# Patient Record
Sex: Male | Born: 1950 | Race: Black or African American | Hispanic: No | Marital: Single | Smoking: Never smoker
Health system: Southern US, Community
[De-identification: ages and names within clinical notes are randomized; demographics above are authoritative.]

## PROBLEM LIST (undated history)

## (undated) DIAGNOSIS — I4892 Unspecified atrial flutter: Secondary | ICD-10-CM

## (undated) DIAGNOSIS — F1010 Alcohol abuse, uncomplicated: Secondary | ICD-10-CM

## (undated) DIAGNOSIS — R569 Unspecified convulsions: Secondary | ICD-10-CM

## (undated) DIAGNOSIS — I340 Nonrheumatic mitral (valve) insufficiency: Secondary | ICD-10-CM

---

## 2020-01-31 ENCOUNTER — Other Ambulatory Visit: Payer: Self-pay

## 2020-01-31 ENCOUNTER — Emergency Department (HOSPITAL_COMMUNITY): Payer: Medicare Other

## 2020-01-31 ENCOUNTER — Observation Stay (HOSPITAL_COMMUNITY)
Admission: EM | Admit: 2020-01-31 | Discharge: 2020-02-01 | Disposition: A | Discharge status: Home / Self Care | Payer: Medicare Other | Attending: Internal Medicine | Admitting: Internal Medicine

## 2020-01-31 ENCOUNTER — Encounter (HOSPITAL_COMMUNITY): Payer: Self-pay | Admitting: Emergency Medicine

## 2020-01-31 ENCOUNTER — Observation Stay (HOSPITAL_BASED_OUTPATIENT_CLINIC_OR_DEPARTMENT_OTHER): Payer: Medicare Other

## 2020-01-31 DIAGNOSIS — Z20822 Contact with and (suspected) exposure to covid-19: Secondary | ICD-10-CM | POA: Insufficient documentation

## 2020-01-31 DIAGNOSIS — R918 Other nonspecific abnormal finding of lung field: Secondary | ICD-10-CM | POA: Diagnosis not present

## 2020-01-31 DIAGNOSIS — S22000A Wedge compression fracture of unspecified thoracic vertebra, initial encounter for closed fracture: Secondary | ICD-10-CM

## 2020-01-31 DIAGNOSIS — F10239 Alcohol dependence with withdrawal, unspecified: Secondary | ICD-10-CM

## 2020-01-31 DIAGNOSIS — I6789 Other cerebrovascular disease: Secondary | ICD-10-CM | POA: Insufficient documentation

## 2020-01-31 DIAGNOSIS — R0600 Dyspnea, unspecified: Secondary | ICD-10-CM | POA: Diagnosis not present

## 2020-01-31 DIAGNOSIS — R079 Chest pain, unspecified: Principal | ICD-10-CM

## 2020-01-31 DIAGNOSIS — Z590 Homelessness: Secondary | ICD-10-CM | POA: Insufficient documentation

## 2020-01-31 DIAGNOSIS — J439 Emphysema, unspecified: Secondary | ICD-10-CM | POA: Insufficient documentation

## 2020-01-31 DIAGNOSIS — R000 Tachycardia, unspecified: Secondary | ICD-10-CM | POA: Insufficient documentation

## 2020-01-31 DIAGNOSIS — I272 Pulmonary hypertension, unspecified: Secondary | ICD-10-CM | POA: Insufficient documentation

## 2020-01-31 DIAGNOSIS — I2510 Atherosclerotic heart disease of native coronary artery without angina pectoris: Secondary | ICD-10-CM | POA: Insufficient documentation

## 2020-01-31 DIAGNOSIS — I700 Atherosclerosis of aorta: Secondary | ICD-10-CM | POA: Insufficient documentation

## 2020-01-31 DIAGNOSIS — F10930 Alcohol use, unspecified with withdrawal, uncomplicated: Secondary | ICD-10-CM

## 2020-01-31 DIAGNOSIS — R569 Unspecified convulsions: Secondary | ICD-10-CM | POA: Diagnosis not present

## 2020-01-31 DIAGNOSIS — M4854XA Collapsed vertebra, not elsewhere classified, thoracic region, initial encounter for fracture: Secondary | ICD-10-CM | POA: Diagnosis not present

## 2020-01-31 DIAGNOSIS — R0602 Shortness of breath: Secondary | ICD-10-CM | POA: Insufficient documentation

## 2020-01-31 DIAGNOSIS — J3489 Other specified disorders of nose and nasal sinuses: Secondary | ICD-10-CM | POA: Insufficient documentation

## 2020-01-31 DIAGNOSIS — I484 Atypical atrial flutter: Secondary | ICD-10-CM | POA: Insufficient documentation

## 2020-01-31 HISTORY — DX: Unspecified convulsions: R56.9

## 2020-01-31 LAB — LIPID PANEL
Cholesterol: 107 mg/dL (ref 0–200)
HDL: 43 mg/dL (ref >40)
LDL Cholesterol: 58 mg/dL (ref 0–99)
Total CHOL/HDL Ratio: 2.5 RATIO
Triglycerides: 29 mg/dL (ref <150)
VLDL: 6 mg/dL (ref 0–40)

## 2020-01-31 LAB — CREATININE, SERUM
Creatinine, Ser: 0.97 mg/dL (ref 0.61–1.24)
GFR calc Af Amer: >60 mL/min (ref >60)
GFR calc non Af Amer: >60 mL/min (ref >60)

## 2020-01-31 LAB — CBC
HCT: 43.1 % (ref 39.0–52.0)
HCT: 42.9 % (ref 39.0–52.0)
Hemoglobin: 13.7 g/dL (ref 13.0–17.0)
Hemoglobin: 13.7 g/dL (ref 13.0–17.0)
MCH: 30.2 pg (ref 26.0–34.0)
MCH: 29.3 pg (ref 26.0–34.0)
MCHC: 31.8 g/dL (ref 30.0–36.0)
MCHC: 31.9 g/dL (ref 30.0–36.0)
MCV: 95.1 fL (ref 80.0–100.0)
MCV: 91.9 fL (ref 80.0–100.0)
Platelets: 125 10*3/uL — ABNORMAL LOW (ref 150–400)
Platelets: 116 10*3/uL — ABNORMAL LOW (ref 150–400)
RBC: 4.53 MIL/uL (ref 4.22–5.81)
RBC: 4.67 MIL/uL (ref 4.22–5.81)
RDW: 13.8 % (ref 11.5–15.5)
RDW: 13.9 % (ref 11.5–15.5)
WBC: 4.4 10*3/uL (ref 4.0–10.5)
WBC: 9.6 10*3/uL (ref 4.0–10.5)
nRBC: 0 % (ref 0.0–0.2)
nRBC: 0 % (ref 0.0–0.2)

## 2020-01-31 LAB — ECHOCARDIOGRAM COMPLETE
Height: 74 in
MV M vel: 4.65 m/s
MV Peak grad: 86.5 mmHg
Radius: 0.8 cm
S' Lateral: 3.9 cm
Weight: 2640 oz

## 2020-01-31 LAB — BASIC METABOLIC PANEL
Anion gap: 16 — ABNORMAL HIGH (ref 5–15)
BUN: 8 mg/dL (ref 8–23)
CO2: 19 mmol/L — ABNORMAL LOW (ref 22–32)
Calcium: 9.1 mg/dL (ref 8.9–10.3)
Chloride: 103 mmol/L (ref 98–111)
Creatinine, Ser: 1.07 mg/dL (ref 0.61–1.24)
GFR calc Af Amer: >60 mL/min (ref >60)
GFR calc non Af Amer: >60 mL/min (ref >60)
Glucose, Bld: 118 mg/dL — ABNORMAL HIGH (ref 70–99)
Potassium: 3.8 mmol/L (ref 3.5–5.1)
Sodium: 138 mmol/L (ref 135–145)

## 2020-01-31 LAB — PHOSPHORUS: Phosphorus: 3.8 mg/dL (ref 2.5–4.6)

## 2020-01-31 LAB — MAGNESIUM: Magnesium: 2.3 mg/dL (ref 1.7–2.4)

## 2020-01-31 LAB — TROPONIN I (HIGH SENSITIVITY)
Troponin I (High Sensitivity): 12 ng/L (ref <18)
Troponin I (High Sensitivity): 35 ng/L — ABNORMAL HIGH (ref <18)
Troponin I (High Sensitivity): 21 ng/L — ABNORMAL HIGH (ref <18)
Troponin I (High Sensitivity): 21 ng/L — ABNORMAL HIGH (ref <18)

## 2020-01-31 LAB — RAPID URINE DRUG SCREEN, HOSP PERFORMED
Amphetamines: NOT DETECTED
Barbiturates: NOT DETECTED
Benzodiazepines: NOT DETECTED
Cocaine: NOT DETECTED
Opiates: NOT DETECTED
Tetrahydrocannabinol: NOT DETECTED

## 2020-01-31 LAB — BRAIN NATRIURETIC PEPTIDE: B Natriuretic Peptide: 195.5 pg/mL — ABNORMAL HIGH (ref 0.0–100.0)

## 2020-01-31 LAB — HEMOGLOBIN A1C
Hgb A1c MFr Bld: 5.4 % (ref 4.8–5.6)
Mean Plasma Glucose: 108.28 mg/dL

## 2020-01-31 LAB — HIV ANTIBODY (ROUTINE TESTING W REFLEX): HIV Screen 4th Generation wRfx: NONREACTIVE

## 2020-01-31 LAB — ETHANOL: Alcohol, Ethyl (B): <10 mg/dL (ref <10)

## 2020-01-31 LAB — SARS CORONAVIRUS 2 BY RT PCR (HOSPITAL ORDER, PERFORMED IN ~~LOC~~ HOSPITAL LAB): SARS Coronavirus 2: NEGATIVE

## 2020-01-31 MED ORDER — SODIUM CHLORIDE 0.9% FLUSH
3.0000 mL | Freq: Two times a day (BID) | INTRAVENOUS | Status: DC
  Administered 2020-01-31 – 2020-02-01 (×2): 3 mL via INTRAVENOUS

## 2020-01-31 MED ORDER — LORAZEPAM 1 MG PO TABS: 1.0000 mg | ORAL_TABLET | ORAL | Status: DC | PRN

## 2020-01-31 MED ORDER — METOPROLOL TARTRATE 5 MG/5ML IV SOLN: 5.0000 mg | Freq: Four times a day (QID) | INTRAVENOUS | Status: DC

## 2020-01-31 MED ORDER — LEVETIRACETAM IN NACL 1000 MG/100ML IV SOLN
1000.0000 mg | Freq: Once | INTRAVENOUS | Status: AC
  Administered 2020-01-31: 1000 mg via INTRAVENOUS
  Filled 2020-01-31: qty 100

## 2020-01-31 MED ORDER — THIAMINE HCL 100 MG/ML IJ SOLN
Freq: Once | INTRAVENOUS | Status: AC
  Filled 2020-01-31: qty 1000

## 2020-01-31 MED ORDER — SODIUM CHLORIDE 0.9% FLUSH: 3.0000 mL | Freq: Once | INTRAVENOUS | Status: DC

## 2020-01-31 MED ORDER — METOPROLOL TARTRATE 25 MG PO TABS
25.0000 mg | ORAL_TABLET | Freq: Two times a day (BID) | ORAL | Status: DC
  Administered 2020-01-31: 25 mg via ORAL
  Filled 2020-01-31: qty 1

## 2020-01-31 MED ORDER — LORAZEPAM 2 MG/ML IJ SOLN: 1.0000 mg | Freq: Four times a day (QID) | INTRAMUSCULAR | Status: DC

## 2020-01-31 MED ORDER — THIAMINE HCL 100 MG PO TABS
100.0000 mg | ORAL_TABLET | Freq: Every day | ORAL | Status: DC
  Administered 2020-01-31 – 2020-02-01 (×2): 100 mg via ORAL
  Filled 2020-01-31 (×2): qty 1

## 2020-01-31 MED ORDER — ADULT MULTIVITAMIN W/MINERALS CH
1.0000 | ORAL_TABLET | Freq: Every day | ORAL | Status: DC
  Administered 2020-01-31 – 2020-02-01 (×2): 1 via ORAL
  Filled 2020-01-31 (×2): qty 1

## 2020-01-31 MED ORDER — ENOXAPARIN SODIUM 40 MG/0.4ML ~~LOC~~ SOLN
40.0000 mg | SUBCUTANEOUS | Status: DC
  Administered 2020-01-31: 40 mg via SUBCUTANEOUS
  Filled 2020-01-31 (×2): qty 0.4

## 2020-01-31 MED ORDER — ACETAMINOPHEN 650 MG RE SUPP: 650.0000 mg | Freq: Four times a day (QID) | RECTAL | Status: DC | PRN

## 2020-01-31 MED ORDER — FOLIC ACID 1 MG PO TABS
1.0000 mg | ORAL_TABLET | Freq: Every day | ORAL | Status: DC
  Administered 2020-01-31 – 2020-02-01 (×2): 1 mg via ORAL
  Filled 2020-01-31 (×2): qty 1

## 2020-01-31 MED ORDER — ASPIRIN 81 MG PO CHEW: 324.0000 mg | CHEWABLE_TABLET | Freq: Once | ORAL | Status: DC

## 2020-01-31 MED ORDER — METOPROLOL TARTRATE 5 MG/5ML IV SOLN: 5.0000 mg | Freq: Four times a day (QID) | INTRAVENOUS | Status: DC | PRN

## 2020-01-31 MED ORDER — LORAZEPAM 2 MG/ML IJ SOLN
2.0000 mg | Freq: Four times a day (QID) | INTRAMUSCULAR | Status: DC
  Administered 2020-01-31: 2 mg via INTRAVENOUS
  Filled 2020-01-31 (×2): qty 1

## 2020-01-31 MED ORDER — THIAMINE HCL 100 MG/ML IJ SOLN: 100.0000 mg | Freq: Every day | INTRAMUSCULAR | Status: DC

## 2020-01-31 MED ORDER — ACETAMINOPHEN 325 MG PO TABS: 650.0000 mg | ORAL_TABLET | Freq: Four times a day (QID) | ORAL | Status: DC | PRN

## 2020-01-31 MED ORDER — IOHEXOL 350 MG/ML SOLN
90.0000 mL | Freq: Once | INTRAVENOUS | Status: AC | PRN
  Administered 2020-01-31: 90 mL via INTRAVENOUS

## 2020-01-31 MED ORDER — LORAZEPAM 2 MG/ML IJ SOLN: 1.0000 mg | INTRAMUSCULAR | Status: DC | PRN

## 2020-01-31 NOTE — H&P (Addendum)
History and Physical        Hospital Admission Note Date: 01/31/2020  Patient name: Roy Ryan Medical record number: [id number] Date of birth: 12-Dec-1950 Age: 69 y.o. Gender: male  PCP: Patient, No Pcp Per  Patient coming from: Homeless, lives at a motel At baseline, ambulates: With a walker  Chief Complaint    Chief Complaint  Patient presents with  . Seizures  . Chest Pain      HPI:   This is a 69 year old male with past medical history of alcohol abuse who presented to the ED via EMS on 7/30 from a local motel with reports of a seizure-like episode x1 this evening as well dyspnea and chest pain on exertion x2 days.  He received a dose of sublingual nitro and full dose aspirin with improved chest pain.  Not had an episode of vomiting or diaphoresis.  States that his last beer was last evening around 8 or 9 PM.  Currently is chest pain-free and denies any complaints at this time.  Denies any known or similar family history of cardiac issues.  ED Course: Hemodynamically stable on room air with tachycardia, CXR with midthoracic compression deformity of uncertain chronicity, CT head with paranasal sinus disease but otherwise unremarkable.  CTA chest negative for PE but with findings suspicious of RUL, LLL pneumonia, atelectasis and age uncertain which compression fractures of T-spine.  Notable labs: Glucose 118, AG 16, troponin XII -> 35, platelets 125 he was given Keppra 1000 mg x 1.  Vitals:   01/31/20 0755 01/31/20 0800  BP: (!) 163/46 (!) 128/92  Pulse:  (!) 117  Resp:  18  Temp:    SpO2:  100%     Review of Systems:  Review of Systems  Constitutional: Negative for chills and fever.  Respiratory: Negative for shortness of breath.   Cardiovascular: Negative for chest pain and palpitations.  Gastrointestinal: Negative for nausea and vomiting.  Musculoskeletal:  Negative for myalgias.       Fall last week  Neurological: Negative.  Negative for tingling.  All other systems reviewed and are negative.   Medical/Social/Family History   Past Medical History: Past Medical History:  Diagnosis Date  . Seizures (HCC)     History reviewed. No pertinent surgical history.  Medications: Prior to Admission medications   Not on File    Allergies:  No Known Allergies  Social History:  reports that he has never smoked. He has never used smokeless tobacco. He reports that he does not drink alcohol and does not use drugs.  Family History: History reviewed. No pertinent family history.   Objective   Physical Exam: Blood pressure (!) 128/92, pulse (!) 117, temperature 98 F (36.7 C), temperature source Oral, resp. rate 18, height 6\' 2"  (1.88 m), weight 74.8 kg, SpO2 100 %.  Physical Exam Vitals and nursing note reviewed.  Constitutional:      Appearance: Normal appearance.  HENT:     Head: Normocephalic and atraumatic.     Comments: Poor dentition Eyes:     Conjunctiva/sclera: Conjunctivae normal.  Cardiovascular:     Rate and Rhythm: Regular rhythm. Tachycardia present.     Heart sounds: Murmur heard.  Systolic murmur is present.  Pulmonary:     Effort: Pulmonary effort is normal.     Breath sounds: Normal breath sounds.  Abdominal:     General: Abdomen is flat.     Palpations: Abdomen is soft.  Musculoskeletal:        General: No swelling or tenderness.  Skin:    Coloration: Skin is not jaundiced or pale.  Neurological:     General: No focal deficit present.     Mental Status: He is alert and oriented to person, place, and time. Mental status is at baseline.  Psychiatric:        Mood and Affect: Mood normal.        Behavior: Behavior normal.     LABS on Admission: I have personally reviewed all the labs and imaging below    Basic Metabolic Panel: Recent Labs  Lab 01/31/20 0140  NA 138  K 3.8  CL 103  CO2 19*    GLUCOSE 118*  BUN 8  CREATININE 1.07  CALCIUM 9.1   Liver Function Tests: No results for input(s): AST, ALT, ALKPHOS, BILITOT, PROT, ALBUMIN in the last 168 hours. No results for input(s): LIPASE, AMYLASE in the last 168 hours. No results for input(s): AMMONIA in the last 168 hours. CBC: Recent Labs  Lab 01/31/20 0140  WBC 4.4  HGB 13.7  HCT 43.1  MCV 95.1  PLT 125*   Cardiac Enzymes: No results for input(s): CKTOTAL, CKMB, CKMBINDEX, TROPONINI in the last 168 hours. BNP: Invalid input(s): POCBNP CBG: No results for input(s): GLUCAP in the last 168 hours.  Radiological Exams on Admission:  DG Chest 2 View  Result Date: 01/31/2020 CLINICAL DATA:  Chest pain EXAM: CHEST - 2 VIEW COMPARISON:  None. FINDINGS: Cardiac shadow is enlarged. No focal infiltrate or sizable effusion is seen. Midthoracic compression deformity is noted of uncertain chronicity. IMPRESSION: Midthoracic compression deformity of uncertain chronicity. No other focal abnormality is noted. Electronically Signed   By: Alcide Clever M.D.   On: 01/31/2020 02:09   CT Head Wo Contrast  Result Date: 01/31/2020 CLINICAL DATA:  Seizure EXAM: CT HEAD WITHOUT CONTRAST TECHNIQUE: Contiguous axial images were obtained from the base of the skull through the vertex without intravenous contrast. COMPARISON:  None. FINDINGS: Brain: There is moderate diffuse atrophy. There is no intracranial mass, hemorrhage, extra-axial fluid collection, or midline shift. There is patchy small vessel disease in the centra semiovale bilaterally. No acute appearing infarct is demonstrable. Vascular: No hyperdense vessel. There is calcification in each carotid siphon region. Skull: The bony calvarium appears intact. Sinuses/Orbits: There is opacification of the visualized right maxillary antrum. There is mild mucosal thickening in several ethmoid air cells. Orbits appear symmetric bilaterally. Other: Mastoid air cells are clear. IMPRESSION: Atrophy with  periventricular small vessel disease. No demonstrable acute infarct. No evident mass or hemorrhage. There are foci of arterial vascular calcification. Extensive paranasal sinus disease involving the right maxillary antrum. Mucosal thickening noted in several ethmoid air cells, primarily on the right. Electronically Signed   By: Bretta Bang III M.D.   On: 01/31/2020 06:43   CT Angio Chest PE W and/or Wo Contrast  Result Date: 01/31/2020 CLINICAL DATA:  Shortness of breath and chest pain EXAM: CT ANGIOGRAPHY CHEST WITH CONTRAST TECHNIQUE: Multidetector CT imaging of the chest was performed using the standard protocol during bolus administration of intravenous contrast. Multiplanar CT image reconstructions and MIPs were obtained to evaluate the vascular anatomy. CONTRAST:  64mL OMNIPAQUE IOHEXOL 350 MG/ML SOLN COMPARISON:  Chest  radiograph January 31, 2020 FINDINGS: Cardiovascular: There is no appreciable pulmonary embolus. There is no thoracic aortic aneurysm. No dissection evident. Note that the contrast bolus within the aorta is not sufficient for assessment for potential dissection. Visualized great vessels appear unremarkable. There are occasional foci of aortic atherosclerosis. There are foci of coronary artery calcification. There is no pericardial effusion or pericardial thickening. Mediastinum/Nodes: No evident thyroid lesions. No evident thoracic adenopathy. No esophageal lesions evident. Lungs/Pleura: There is scarring in the apices, more on the right than on the left. There is ill-defined airspace opacity in the posterior segment of the right upper lobe with several areas of tree on bud type appearance. A focal area of opacity is noted in the superior segment of the left lower lobe measuring 1.7 x 1.6 cm. There is probable atelectatic change elsewhere in the lower lobes with questionable small focus of pneumonia in the posterior aspect of the superior segment right lower lobe. Note that there is a  degree of underlying centrilobular emphysematous change. No pleural effusions are evident. Upper Abdomen: There is reflux of contrast into the inferior vena cava and hepatic veins. Visualized upper abdominal structures otherwise appear normal. Musculoskeletal: There is moderately severe wedging of the T8 vertebral body. There is milder anterior wedging at T6 and T7. No blastic or lytic bone lesions evident. No chest wall lesions. Review of the MIP images confirms the above findings. IMPRESSION: 1. No demonstrable pulmonary embolus. No thoracic aortic aneurysm. No dissection evident with proviso that the contrast bolus in the aorta is not sufficient for dissection assessment. There is aortic atherosclerosis as well as foci of coronary artery calcification. 2. Infiltrate in the posterior segment of the right upper lobe consistent with pneumonia. 3. Suspect pneumonia abutting the pleura in the superior segment of the left lower lobe. This area measures 1.7 x 1.6 cm. A neoplastic focus with mild surrounding inflammation could present in this manner. This finding warrants noncontrast enhanced chest CT to further evaluate this area after appropriate treatment for pneumonia in approximately 4-6 weeks. 4. Areas of probable atelectasis in the lower lung regions elsewhere with questionable mild developing pneumonia in the posterior aspect of the superior segment right lower lobe. Note a degree of underlying emphysematous change. 5. Reflux of contrast into the inferior vena cava and hepatic veins may indicate a degree of increase in right heart pressure. 6.  No evident adenopathy. 7. Age uncertain wedge compression fractures in the thoracic spine, most severe at T8. Aortic Atherosclerosis (ICD10-I70.0) and Emphysema (ICD10-J43.9). Electronically Signed   By: Bretta Bang III M.D.   On: 01/31/2020 06:50      EKG: Independently reviewed.  Left axis deviation, sinus tachycardia, Q wave in lead V3   A & P   Active  Problems:   Chest pain   1. Alcohol withdrawal, concern for withdrawal seizure a. Last drink last evening, drinks beer only b. Currently AAO x3 no acute distress c. No known history of seizures d. Discussed with Dr. Otelia Limes, neurology who recommended standing Ativan taper: Ativan 2 mg every 6 hours x4 doses followed by Ativan 1 mg every 6 hours x8 doses to prevent recurrent seizure and no need to continue Keppra e. Continue as needed CIWA protocol f. Seizure precautions g. LP eval h. TOC consulted  2. Chest pain on exertion  dyspnea on exertion a. Personal read of EKG: Left axis deviation, sinus tachycardia, Q wave in lead V3 b. CTA chest with concerning findings of pneumonia however no WBC  or fever - hold off on antibiotics for now c. Slight bump in troponin from 12->35 d. Currently chest pain-free, tolerating room air e. Lipid panel, HA1C for risk factor ratification f. Echo g. tele h. Cardiology consulted 3.    DVT prophylaxis: Lovenox   Code Status: Not on file  Diet: N.p.o. pending SLP eval Family Communication: Admission, patients condition and plan of care including tests being ordered have been discussed with the patient who indicates understanding and agrees with the plan and Code Status.  Disposition Plan: The appropriate patient status for this patient is OBSERVATION. Observation status is judged to be reasonable and necessary in order to provide the required intensity of service to ensure the patient's safety. The patient's presenting symptoms, physical exam findings, and initial radiographic and laboratory data in the context of their medical condition is felt to place them at decreased risk for further clinical deterioration. Furthermore, it is anticipated that the patient will be medically stable for discharge from the hospital within 2 midnights of admission. The following factors support the patient status of observation.   " The patient's presenting symptoms include  chest pain, dyspnea on exertion, seizure. " The physical exam findings include unremarkable. " The initial radiographic and laboratory data are minor troponin bump.    Status is: Observation  The patient remains OBS appropriate and will d/c before 2 midnights.  Dispo: The patient is from: Homeless/lives in a motel              Anticipated d/c is to: Home              Anticipated d/c date is: 2 days              Patient currently is not medically stable to d/c.     Consultants  . cardiology . Discussed with neurology over the phone  Procedures  . None  Time Spent on Admission: 66 minutes    Jae DireJared E Johnpaul Gillentine, DO [facility] Pager [phone] 01/31/2020, 8:18 AM

## 2020-01-31 NOTE — Significant Event (Addendum)
HOSPITAL MEDICINE OVERNIGHT EVENT NOTE  Notified by nursing remains lethargic since last dose of Ativan given earlier in the afternoon via CIWA protocol.  Because of this, nursing feels it is unsafe at this time to provide next dose of scheduled Metoprolol.  Will switch 25mg  PO Q12 metoprolol to Metoprolol 5mg  IV Q6hrs for now until mentation improves and patient is able to swallow.  Jameila Keeny   ADDENDUM  Notified by nursing patient is arousable and tolerating oral intake now.  Will switch back to PO Metoprolol.   Sallyann Kinnaird

## 2020-01-31 NOTE — ED Provider Notes (Signed)
[facility] EMERGENCY DEPARTMENT Provider Note   CSN: [id number] Arrival date & time: 01/31/20  0120     History Chief Complaint  Patient presents with  . Seizures  . Chest Pain    Roy Ryan is a 69 y.o. male.  The history is provided by the patient.  Seizures Seizure activity on arrival: no   Seizure type:  Grand mal Preceding symptoms: no sensation of an aura present   Initial focality:  None Episode characteristics: abnormal movements   Postictal symptoms: no confusion   Return to baseline: yes   Severity:  Mild Timing:  Once Progression:  Resolved Context: alcohol withdrawal   Recent head injury:  No recent head injuries PTA treatment:  None Chest Pain Pain location:  Substernal area Pain quality: dull   Pain radiates to:  Does not radiate Pain severity:  Moderate Onset quality:  Gradual Duration:  1 day Timing:  Intermittent Progression:  Resolved Chronicity:  New Context: not drug use   Context comment:  2 days of DOE and intermittent CP, this evening had an episode that resolved with ASA and SL NTG x 1 Relieved by:  Aspirin and nitroglycerin Worsened by:  Exertion Ineffective treatments:  None tried Associated symptoms: shortness of breath   Associated symptoms: no abdominal pain, no anorexia, no anxiety, no diaphoresis, no dizziness, no fatigue, no fever, no lower extremity edema, no palpitations, no syncope and no vomiting   Risk factors: male sex   Risk factors: no diabetes mellitus and no smoking   Patient reports drinking alcohol, beer only, states he does not smoke nor does he use drugs.  Has had exertional SOB and CP x 2 days this evening had an episode.  Resolved with 1 SL NTG and ASA by EMS. Had a witnessed seizure following drinking.  Denies taking any medication on a regular basis.  States he had 1 dose of a covid vaccine, unsure which on.       Past Medical History:  Diagnosis Date  . Seizures (HCC)     There are no  problems to display for this patient.   History reviewed. No pertinent surgical history.     History reviewed. No pertinent family history.  Social History   Tobacco Use  . Smoking status: Never Smoker  . Smokeless tobacco: Never Used  Substance Use Topics  . Alcohol use: Never  . Drug use: Never    Home Medications Prior to Admission medications   Not on File    Allergies    Patient has no known allergies.  Review of Systems   Review of Systems  Constitutional: Negative for diaphoresis, fatigue and fever.  HENT: Negative for congestion.   Eyes: Negative for visual disturbance.  Respiratory: Positive for shortness of breath.   Cardiovascular: Positive for chest pain. Negative for palpitations and syncope.  Gastrointestinal: Negative for abdominal pain, anorexia and vomiting.  Genitourinary: Negative for difficulty urinating.  Musculoskeletal: Negative for arthralgias.  Skin: Negative for rash.  Neurological: Positive for seizures. Negative for dizziness.  Psychiatric/Behavioral: Negative for agitation.  All other systems reviewed and are negative.   Physical Exam Updated Vital Signs BP (!) 130/86 (BP Location: Left Arm)   Pulse (!) 113   Temp 98 F (36.7 C) (Oral)   Resp 16   Ht 6\' 2"  (1.88 m)   Wt 81 kg   SpO2 99%   BMI 22.93 kg/m   Physical Exam Vitals and nursing note reviewed.  Constitutional:  Appearance: Normal appearance. He is not diaphoretic.  HENT:     Head: Normocephalic and atraumatic.     Nose: Nose normal.  Eyes:     Conjunctiva/sclera: Conjunctivae normal.     Pupils: Pupils are equal, round, and reactive to light.  Cardiovascular:     Rate and Rhythm: Normal rate and regular rhythm.     Pulses: Normal pulses.     Heart sounds: Normal heart sounds.  Pulmonary:     Effort: Pulmonary effort is normal.     Breath sounds: Normal breath sounds.  Abdominal:     General: Abdomen is flat. Bowel sounds are normal.     Tenderness:  There is no abdominal tenderness. There is no guarding or rebound.  Musculoskeletal:        General: Normal range of motion.     Cervical back: Normal range of motion and neck supple.  Skin:    General: Skin is warm and dry.     Capillary Refill: Capillary refill takes less than 2 seconds.  Neurological:     General: No focal deficit present.     Mental Status: He is alert and oriented to person, place, and time.     Deep Tendon Reflexes: Reflexes normal.  Psychiatric:        Mood and Affect: Mood normal.        Behavior: Behavior normal.     ED Results / Procedures / Treatments   Labs (all labs ordered are listed, but only abnormal results are displayed) Results for orders placed or performed during the hospital encounter of 01/31/20  Basic metabolic panel  Result Value Ref Range   Sodium 138 135 - 145 mmol/L   Potassium 3.8 3.5 - 5.1 mmol/L   Chloride 103 98 - 111 mmol/L   CO2 19 (L) 22 - 32 mmol/L   Glucose, Bld 118 (H) 70 - 99 mg/dL   BUN 8 8 - 23 mg/dL   Creatinine, Ser 8.67 0.61 - 1.24 mg/dL   Calcium 9.1 8.9 - 61.9 mg/dL   GFR calc non Af Amer >60 >60 mL/min   GFR calc Af Amer >60 >60 mL/min   Anion gap 16 (H) 5 - 15  CBC  Result Value Ref Range   WBC 4.4 4.0 - 10.5 K/uL   RBC 4.53 4.22 - 5.81 MIL/uL   Hemoglobin 13.7 13.0 - 17.0 g/dL   HCT 50.9 39 - 52 %   MCV 95.1 80.0 - 100.0 fL   MCH 30.2 26.0 - 34.0 pg   MCHC 31.8 30.0 - 36.0 g/dL   RDW 32.6 71.2 - 45.8 %   Platelets 125 (L) 150 - 400 K/uL   nRBC 0.0 0.0 - 0.2 %  Troponin I (High Sensitivity)  Result Value Ref Range   Troponin I (High Sensitivity) 12 <18 ng/L  Troponin I (High Sensitivity)  Result Value Ref Range   Troponin I (High Sensitivity) 35 (H) <18 ng/L   DG Chest 2 View  Result Date: 01/31/2020 CLINICAL DATA:  Chest pain EXAM: CHEST - 2 VIEW COMPARISON:  None. FINDINGS: Cardiac shadow is enlarged. No focal infiltrate or sizable effusion is seen. Midthoracic compression deformity is noted of  uncertain chronicity. IMPRESSION: Midthoracic compression deformity of uncertain chronicity. No other focal abnormality is noted. Electronically Signed   By: Alcide Clever M.D.   On: 01/31/2020 02:09    EKG EKG Interpretation  Date/Time:  Friday January 31 2020 01:37:18 EDT Ventricular Rate:  119 PR Interval:  QRS Duration: 88 QT Interval:  358 QTC Calculation: 503 R Axis:   -40 Text Interpretation: Sinus tachycardia Left axis deviation Left ventricular hypertrophy with repolarization abnormality ( Sokolow-Lyon , Cornell product , Romhilt-Estes ) Anteroseptal infarct , age undetermined Confirmed by Edahi Kroening ([id number]) on 01/31/2020 5:32:38 AM   Radiology DG Chest 2 View  Result Date: 01/31/2020 CLINICAL DATA:  Chest pain EXAM: CHEST - 2 VIEW COMPARISON:  None. FINDINGS: Cardiac shadow is enlarged. No focal infiltrate or sizable effusion is seen. Midthoracic compression deformity is noted of uncertain chronicity. IMPRESSION: Midthoracic compression deformity of uncertain chronicity. No other focal abnormality is noted. Electronically Signed   By: Alcide Clever M.D.   On: 01/31/2020 02:09    Procedures Procedures (including critical care time)  Medications Ordered in ED Medications  sodium chloride flush (NS) 0.9 % injection 3 mL (has no administration in time range)  sodium chloride 0.9 % 1,000 mL with thiamine 500 mg, folic acid 1 mg, multivitamins adult 10 mL, magnesium sulfate 2 g infusion (has no administration in time range)  levETIRAcetam (KEPPRA) IVPB 1000 mg/100 mL premix (has no administration in time range)    ED Course  I have reviewed the triage vital signs and the nursing notes.  Pertinent labs & imaging results that were available during my care of the patient were reviewed by me and considered in my medical decision making (see chart for details).    Roy Ryan was evaluated in Emergency Department on 01/31/2020 for the symptoms described in the history of present  illness. He was evaluated in the context of the global COVID-19 pandemic, which necessitated consideration that the patient might be at risk for infection with the SARS-CoV-2 virus that causes COVID-19. Institutional protocols and algorithms that pertain to the evaluation of patients at risk for COVID-19 are in a state of rapid change based on information released by regulatory bodies including the CDC and federal and state organizations. These policies and algorithms were followed during the patient's care in the ED.  Final Clinical Impression(s) / ED Diagnoses Admit for exertional chest pain with abnormal EKG and positive troponin.  Seizures are almost certainly alcohol withdrawal.  Patient has been given a dose of keppra and started on a banana bag.     Jizel Cheeks, MD 01/31/20 [phone]

## 2020-01-31 NOTE — Progress Notes (Signed)
Echocardiogram 2D Echocardiogram has been performed.  Roy Ryan Roy Ryan 01/31/2020, 10:59 AM

## 2020-01-31 NOTE — ED Triage Notes (Signed)
Patient arrived with EMS from a local motel reports seizure episode x1 this evening and central chest pain for 2 days with exertional dyspnea , no emesis or diaphoresis , he received ASA 324 mg and 1 NTG sl with relief , alert and oriented at arrival , denies pain/respiration unlabored .

## 2020-01-31 NOTE — Social Work (Signed)
CSW received consult for SA resources. CSW attempted to provide resources to patient and observed patient asleep, snoring. CSW called patient's name a couple of time to awaken without success. TOC team will follow-up.  Verlon Au, LCSWA Clinical Social Worker

## 2020-01-31 NOTE — Progress Notes (Addendum)
   Echo showed LVEF of 50-55% with normal wall motion, biatrial enlargement, normal RV with moderately reduced systolic function. 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 Discussed with Dr. Allyson Sabal. Will plan for [facility] tomorrow. Will make NPO at midnight. I will notify nuclear medicine tech about Myoview over the weekend. Could consider TEE on Mondayfor further evaluation of mitral valve; however, patient is likely not a good candidate for repair given alcohol abuse. There is also a concern that patient may leave AMA before this could be done.  Corrin Parker, PA-C 01/31/2020 3:07 PM

## 2020-01-31 NOTE — Progress Notes (Signed)
   01/31/20 1927  Assess: MEWS Score  Temp 98.1 F (36.7 C)  BP (!) 115/86  Pulse Rate (!) 114  Resp 14  SpO2 95 %  O2 Device Room Air  Assess: MEWS Score  MEWS Temp 0  MEWS Systolic 0  MEWS Pulse 2  MEWS RR 0  MEWS LOC 0  MEWS Score 2  MEWS Score Color Yellow  Assess: if the MEWS score is Yellow or Red  Were vital signs taken at a resting state? Yes  Focused Assessment No change from prior assessment  Early Detection of Sepsis Score *See Row Information* Low  MEWS guidelines implemented *See Row Information* Yes  Treat  MEWS Interventions Escalated (See documentation below)  Pain Scale 0-10  Pain Score Asleep  Take Vital Signs  Increase Vital Sign Frequency  Yellow: Q 2hr X 2 then Q 4hr X 2, if remains yellow, continue Q 4hrs  Escalate  MEWS: Escalate Yellow: discuss with charge nurse/RN and consider discussing with provider and RRT  Notify: Charge Nurse/RN  Name of Charge Nurse/RN Notified Clydie Braun RN  Date Charge Nurse/RN Notified 01/31/20  Time Charge Nurse/RN Notified 1959  Notify: Provider  Provider Name/Title Shaloub  Date Provider Notified 01/31/20  Time Provider Notified 2035  Notification Type Page  Notification Reason Other (Comment)  Response See new orders  Date of Provider Response 01/31/20  Time of Provider Response 2043  Document  Patient Outcome Other (Comment) (ongoing)  Progress note created (see row info) Yes  Tachycardia present since admission; activating yellow MEWS protocol as a precaution.

## 2020-01-31 NOTE — Consult Note (Addendum)
Cardiology Consultation:   Patient ID: Roy Ryan MRN: [id number]; DOB: 1950/07/26  Admit date: 01/31/2020 Date of Consult: 01/31/2020  Primary Care Provider: Patient, No Pcp Per [facility] Cardiologist: New to [facility] (Dr. Allyson Sabal) [facility] Electrophysiologist:  None    Patient Profile:   Roy Ryan is a 69 y.o. homeless male with a history of alcohol abuse who is being seen today for the evaluation of dyspnea on exertion and chest pain at the request of Dr. Dairl Ponder.  History of Present Illness:   Roy Ryan is a 69 year old homeless male with a history of alcohol abuse but no other known past medical history. He currently lives in a motel. Patient has no known cardiac history. No known diagnosis of hypertension, hyperlipidemia, or diabetes. He states he may have been seen by a Cardiologist in [place], [place], but he cannot give me any details about this or tell me why he was seen. He states he was hospitalized a few months ago in [place] following seizure. Sounds like this was felt to be due to alcohol withdrawal but no records available from this. He denies any prior cardiac work-up. He denies any smoking history but states he chews tobacco. He reports drinking three 12 ounce beer daily. Denies drinking any hard liquor. Denies any recreational drug use. No known family history of CAD, CHF, or stroke.  Patient presented to the ED via EMS from a local motel for further evaluation of seizure and well as chest pain and dyspnea on exertion. He received Aspirin 324mg  and 1 dose of sublingual Nitro en route to ED with relief of pain. In the ED, patient hypertensive and tachycardic. EKG showed atrial flutter, rate 119 bpm, with significant LVH, left axis deviation, Q waves in leads V1-V3, and ST depression in inferior leads and leads V5-V6. High-sensitivity troponin 12 >> 35. Chest x-ray showed midthoracic compression deformity of uncertain chronicity. WBC 4.4, Hgb 13.7, Plts 125. Na 138, K  3.8, Glucose 118, BUN 8, Cr 1.07. COVID-19 negative. Urine drug screen and ETOH level pending. Head CT negative for acute infarct. Chest CTA negative for PE but was concerning for possible pneumonia of left lower lobe and right upper lobe. Reflux of contrast was noted in the IVC and hepatic veins which may suggest increased right heart pressures. Seizures felt to be due to alcohol withdrawal. Patient admitted, given Keppra, and started on CIWA protocol. Cardiology consulted for further evaluation of chest pain and dyspnea.   At the time of this evaluation, patient resting comfortably in no acute distress. He denies having a seizure but states his girlfriend called 911 because she thought he was going to have a seizure. He does report he has been having exertional dyspnea with associated substernal non-radiating chest pain for about the last month. This occurs when he walks up a hill to the grocery. Does not occur any other time. No resting symptoms. No orthopnea, PND, or edema. No palpitations, lightheadedness, dizziness, or near syncope. No recent fevers, chills, or body aches, No GI symptoms. No hematochezia, melena, or hematuria.   Past Medical History:  Diagnosis Date   Seizures (HCC)     History reviewed. No pertinent surgical history.   Home Medications:  Prior to Admission medications   Not on File    Inpatient Medications: Scheduled Meds:  sodium chloride flush  3 mL Intravenous Once   Continuous Infusions:   PRN Meds:   Allergies:   No Known Allergies  Social History:   Social  History   Socioeconomic History   Marital status: Single    Spouse name: Not on file   Number of children: Not on file   Years of education: Not on file   Highest education level: Not on file  Occupational History   Not on file  Tobacco Use   Smoking status: Never Smoker   Smokeless tobacco: Never Used  Substance and Sexual Activity   Alcohol use: Never   Drug use: Never   Sexual activity:  Not on file  Other Topics Concern   Not on file  Social History Narrative   Not on file   Social Determinants of Health   Financial Resource Strain:    Difficulty of Paying Living Expenses:   Food Insecurity:    Worried About Running Out of Food in the Last Year:    Barista in the Last Year:   Transportation Needs:    Freight forwarder (Medical):    Lack of Transportation (Non-Medical):   Physical Activity:    Days of Exercise per Week:    Minutes of Exercise per Session:   Stress:    Feeling of Stress :   Social Connections:    Frequency of Communication with Friends and Family:    Frequency of Social Gatherings with Friends and Family:    Attends Religious Services:    Active Member of Clubs or Organizations:    Attends Engineer, structural:    Marital Status:   Intimate Partner Violence:    Fear of Current or Ex-Partner:    Emotionally Abused:    Physically Abused:    Sexually Abused:     Family History:   Family History  Problem Relation Age of Onset   CAD Neg Hx    Heart failure Neg Hx    Stroke Neg Hx      ROS:  Please see the history of present illness.  All other ROS reviewed and negative.     Physical Exam/Data:   Vitals:   01/31/20 0755 01/31/20 0800 01/31/20 0900 01/31/20 1000  BP: (!) 163/46 (!) 128/92 (!) 135/80 (!) 119/86  Pulse:  (!) 117 (!) 118 83  Resp:  Temp:      TempSrc:      SpO2:  100% 99% 100%  Weight:      Height:       No intake or output data in the 24 hours ending 01/31/20 1238 Last 3 Weights 01/31/2020 01/31/2020  Weight (lbs) 165 lb 178 lb 9.2 oz  Weight (kg) 74.844 kg 81 kg     Body mass index is 21.18 kg/m.  General: 69 y.o. thin African-American male resting comfortably in no acute distress. HEENT: Normocephalic and atraumatic.  Neck: Supple. No JVD. Heart: Tachycardic with regular rhythm. Distinct S1 and S2. III-IV/VI systolic murmur best heard at apex with radiation to left axilla. No  gallops or rubs. Radial and distal pedal pulses 2+ and equal bilaterally. Lungs: No increased work of breathing. Decreased breath sounds in bilateral bases. No significant wheezes, rhonchi, or rales appreciated. Abdomen: Soft, non-distended, and non-tender to palpation. Bowel sounds present. Extremities: No lower extremity edema.    Skin: Warm and dry. Neuro: No focal deficits. Psych: Normal affect. Responds appropriately.   EKG:  The EKG was personally reviewed and demonstrates: Sinus tachycardia vs atrial flutter, rate 119 bpm, with atrial flutter with LVH, left axis deviation, Q waves in V1-V3, and ST depressions in inferior leads and  leads V5-V6.  Telemetry:  Telemetry was personally reviewed and demonstrates:  Sinus tachycardia vs atrial flutter with rates in the 110's.   Relevant CV Studies: Echo pending.  Laboratory Data:  High Sensitivity Troponin:   Recent Labs  Lab 01/31/20 0140 01/31/20 0336  TROPONINIHS 12 35*     Chemistry Recent Labs  Lab 01/31/20 0140  NA 138  K 3.8  CL 103  CO2 19*  GLUCOSE 118*  BUN 8  CREATININE 1.07  CALCIUM 9.1  GFRNONAA >60  GFRAA >60  ANIONGAP 16*    No results for input(s): PROT, ALBUMIN, AST, ALT, ALKPHOS, BILITOT in the last 168 hours. Hematology Recent Labs  Lab 01/31/20 0140  WBC 4.4  RBC 4.53  HGB 13.7  HCT 43.1  MCV 95.1  MCH 30.2  MCHC 31.8  RDW 13.8  PLT 125*   BNPNo results for input(s): BNP, PROBNP in the last 168 hours.  DDimer No results for input(s): DDIMER in the last 168 hours.   Radiology/Studies:  DG Chest 2 View  Result Date: 01/31/2020 CLINICAL DATA:  Chest pain EXAM: CHEST - 2 VIEW COMPARISON:  None. FINDINGS: Cardiac shadow is enlarged. No focal infiltrate or sizable effusion is seen. Midthoracic compression deformity is noted of uncertain chronicity. IMPRESSION: Midthoracic compression deformity of uncertain chronicity. No other focal abnormality is noted. Electronically Signed   By: Alcide Clever M.D.   On: 01/31/2020 02:09   CT Head Wo Contrast  Result Date: 01/31/2020 CLINICAL DATA:  Seizure EXAM: CT HEAD WITHOUT CONTRAST TECHNIQUE: Contiguous axial images were obtained from the base of the skull through the vertex without intravenous contrast. COMPARISON:  None. FINDINGS: Brain: There is moderate diffuse atrophy. There is no intracranial mass, hemorrhage, extra-axial fluid collection, or midline shift. There is patchy small vessel disease in the centra semiovale bilaterally. No acute appearing infarct is demonstrable. Vascular: No hyperdense vessel. There is calcification in each carotid siphon region. Skull: The bony calvarium appears intact. Sinuses/Orbits: There is opacification of the visualized right maxillary antrum. There is mild mucosal thickening in several ethmoid air cells. Orbits appear symmetric bilaterally. Other: Mastoid air cells are clear. IMPRESSION: Atrophy with periventricular small vessel disease. No demonstrable acute infarct. No evident mass or hemorrhage. There are foci of arterial vascular calcification. Extensive paranasal sinus disease involving the right maxillary antrum. Mucosal thickening noted in several ethmoid air cells, primarily on the right. Electronically Signed   By: Bretta Bang III M.D.   On: 01/31/2020 06:43   CT Angio Chest PE W and/or Wo Contrast  Result Date: 01/31/2020 CLINICAL DATA:  Shortness of breath and chest pain EXAM: CT ANGIOGRAPHY CHEST WITH CONTRAST TECHNIQUE: Multidetector CT imaging of the chest was performed using the standard protocol during bolus administration of intravenous contrast. Multiplanar CT image reconstructions and MIPs were obtained to evaluate the vascular anatomy. CONTRAST:  31mL OMNIPAQUE IOHEXOL 350 MG/ML SOLN COMPARISON:  Chest radiograph January 31, 2020 FINDINGS: Cardiovascular: There is no appreciable pulmonary embolus. There is no thoracic aortic aneurysm. No dissection evident. Note that the contrast bolus  within the aorta is not sufficient for assessment for potential dissection. Visualized great vessels appear unremarkable. There are occasional foci of aortic atherosclerosis. There are foci of coronary artery calcification. There is no pericardial effusion or pericardial thickening. Mediastinum/Nodes: No evident thyroid lesions. No evident thoracic adenopathy. No esophageal lesions evident. Lungs/Pleura: There is scarring in the apices, more on the right than on the left. There is ill-defined airspace opacity in the  posterior segment of the right upper lobe with several areas of tree on bud type appearance. A focal area of opacity is noted in the superior segment of the left lower lobe measuring 1.7 x 1.6 cm. There is probable atelectatic change elsewhere in the lower lobes with questionable small focus of pneumonia in the posterior aspect of the superior segment right lower lobe. Note that there is a degree of underlying centrilobular emphysematous change. No pleural effusions are evident. Upper Abdomen: There is reflux of contrast into the inferior vena cava and hepatic veins. Visualized upper abdominal structures otherwise appear normal. Musculoskeletal: There is moderately severe wedging of the T8 vertebral body. There is milder anterior wedging at T6 and T7. No blastic or lytic bone lesions evident. No chest wall lesions. Review of the MIP images confirms the above findings. IMPRESSION: 1. No demonstrable pulmonary embolus. No thoracic aortic aneurysm. No dissection evident with proviso that the contrast bolus in the aorta is not sufficient for dissection assessment. There is aortic atherosclerosis as well as foci of coronary artery calcification. 2. Infiltrate in the posterior segment of the right upper lobe consistent with pneumonia. 3. Suspect pneumonia abutting the pleura in the superior segment of the left lower lobe. This area measures 1.7 x 1.6 cm. A neoplastic focus with mild surrounding inflammation  could present in this manner. This finding warrants noncontrast enhanced chest CT to further evaluate this area after appropriate treatment for pneumonia in approximately 4-6 weeks. 4. Areas of probable atelectasis in the lower lung regions elsewhere with questionable mild developing pneumonia in the posterior aspect of the superior segment right lower lobe. Note a degree of underlying emphysematous change. 5. Reflux of contrast into the inferior vena cava and hepatic veins may indicate a degree of increase in right heart pressure. 6.  No evident adenopathy. 7. Age uncertain wedge compression fractures in the thoracic spine, most severe at T8. Aortic Atherosclerosis (ICD10-I70.0) and Emphysema (ICD10-J43.9). Electronically Signed   By: Bretta Bang III M.D.   On: 01/31/2020 06:50   The patient's TIMI risk score is 3, which indicates a 13% risk of all cause mortality, new or recurrent myocardial infarction or need for urgent revascularization in the next 14 days.   New York Heart Association (NYHA) Functional Class NYHA Class II  Assessment and Plan:   Dyspnea on Exertion with Associated Chest Pain - Patient reports dyspnea on exertion with associated chest pain over the last month. - EKG shows sinus tachycardia vs  atrial flutter with LVH, left axis deviation, Q waves in V1-V3, and ST depressions in inferior leads and leads V5-V6.  - High-sensitivity troponin elevated at 12 >> 35. Continue to trend. - Echo pending. - Does not appear volume overloaded on exam. No need for Lasix at this time but will check BNP. - Troponin elevation may be due to reports of seizure. - Further recommendations regarding ischemic work-up will depend on Echo results.  Sinus Tachycardia vs Atrial Flutter with RVR - Patient presented in atrial flutter with rates in the 110's.  - Will start Lopressor 25mg  twice daily. - Tachycardia also likely contributed but alcohol withdrawal. - Will repeat EKG once rate improves  some to help differentiate whether or not this is sinus tach or atrial flutter. CHA2DS2-VASc = 1-3 (age with possible CAD and CHF). If truly atrial flutter, may not be a good anticoagulation candidate given significant alcohol use.  Systolic Murmur - Loud systolic murmur noted at apex with radiation to left axilla consistent with  mitral regurgitation.  - Echo pending.  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 - Head CT showed no acute infarct. - ETOH <10.  - UDS negative. - On CIWA protocl. - Management per primary team.  For questions or updates, please contact [facility] Please consult [email or web address] for contact info under    Signed, Corrin ParkerCallie E Goodrich, PA-C  01/31/2020 12:38 PM  Agree with note by Marjie Skiffallie Goodrich, PA-C  We are asked to see this 69 year old single/homeless African-American male who lives in a hotel room with his significant other for atypical chest pain and shortness of breath.  He does admit to excessive alcohol intake and has had withdrawal seizures in the past.  He is noticed dyspnea on exertion and some substernal chest pressure.  He is on no medications as an outpatient.  He drinks multiple beers a night.  On exam is good apical systolic murmur consistent with mitral regurgitation.  Exam otherwise is benign.  He is somewhat tachycardic today.  Agree with 2D echocardiography, p.o. beta-blocker remainder of work-up pending results.    Runell GessJonathan J. Ociel Ryan, M.D., FACP, [facility] At HollywoodFACC, Earl LagosFAHA, [facility] SouthFSCAI [facility] [address] [place], [place]  [id number]  [phone] 01/31/2020 1:21 PM

## 2020-02-01 ENCOUNTER — Observation Stay (HOSPITAL_COMMUNITY): Payer: Medicare Other

## 2020-02-01 DIAGNOSIS — I4892 Unspecified atrial flutter: Secondary | ICD-10-CM | POA: Diagnosis not present

## 2020-02-01 DIAGNOSIS — F10230 Alcohol dependence with withdrawal, uncomplicated: Secondary | ICD-10-CM

## 2020-02-01 DIAGNOSIS — R918 Other nonspecific abnormal finding of lung field: Secondary | ICD-10-CM

## 2020-02-01 DIAGNOSIS — R079 Chest pain, unspecified: Secondary | ICD-10-CM | POA: Diagnosis not present

## 2020-02-01 LAB — COMPREHENSIVE METABOLIC PANEL
ALT: 14 U/L (ref 0–44)
AST: 19 U/L (ref 15–41)
Albumin: 3.1 g/dL — ABNORMAL LOW (ref 3.5–5.0)
Alkaline Phosphatase: 98 U/L (ref 38–126)
Anion gap: 10 (ref 5–15)
BUN: 8 mg/dL (ref 8–23)
CO2: 22 mmol/L (ref 22–32)
Calcium: 8.8 mg/dL — ABNORMAL LOW (ref 8.9–10.3)
Chloride: 105 mmol/L (ref 98–111)
Creatinine, Ser: 1.06 mg/dL (ref 0.61–1.24)
GFR calc Af Amer: >60 mL/min (ref >60)
GFR calc non Af Amer: >60 mL/min (ref >60)
Glucose, Bld: 80 mg/dL (ref 70–99)
Potassium: 3.7 mmol/L (ref 3.5–5.1)
Sodium: 137 mmol/L (ref 135–145)
Total Bilirubin: 4.5 mg/dL — ABNORMAL HIGH (ref 0.3–1.2)
Total Protein: 6.4 g/dL — ABNORMAL LOW (ref 6.5–8.1)

## 2020-02-01 LAB — TSH: TSH: 1.027 u[IU]/mL (ref 0.350–4.500)

## 2020-02-01 LAB — CBC
HCT: 43.7 % (ref 39.0–52.0)
Hemoglobin: 14 g/dL (ref 13.0–17.0)
MCH: 29.5 pg (ref 26.0–34.0)
MCHC: 32 g/dL (ref 30.0–36.0)
MCV: 92.2 fL (ref 80.0–100.0)
Platelets: 101 10*3/uL — ABNORMAL LOW (ref 150–400)
RBC: 4.74 MIL/uL (ref 4.22–5.81)
RDW: 13.7 % (ref 11.5–15.5)
WBC: 9.2 10*3/uL (ref 4.0–10.5)
nRBC: 0 % (ref 0.0–0.2)

## 2020-02-01 MED ORDER — TECHNETIUM TC 99M TETROFOSMIN IV KIT
10.5500 | PACK | Freq: Once | INTRAVENOUS | Status: AC | PRN
  Administered 2020-02-01: 10.55 via INTRAVENOUS

## 2020-02-01 MED ORDER — METOPROLOL TARTRATE 25 MG PO TABS
37.5000 mg | ORAL_TABLET | Freq: Two times a day (BID) | ORAL | Status: DC
  Administered 2020-02-01: 37.5 mg via ORAL
  Filled 2020-02-01: qty 2

## 2020-02-01 MED ORDER — REGADENOSON 0.4 MG/5ML IV SOLN
INTRAVENOUS | Status: AC
  Filled 2020-02-01: qty 5

## 2020-02-01 MED ORDER — REGADENOSON 0.4 MG/5ML IV SOLN
0.4000 mg | Freq: Once | INTRAVENOUS | Status: DC
  Filled 2020-02-01: qty 5

## 2020-02-01 NOTE — Progress Notes (Signed)
Pt wants to leave, signed AMA paperwork. IV AND tele removed. Paged MD

## 2020-02-01 NOTE — Progress Notes (Signed)
SLP Cancellation Note  Patient Details Name: Roy Ryan MRN: [id number] DOB: 16-Apr-1951   Cancelled treatment:        Attempted to see pt for swallowing evaluation.  Pt off floor for procedure at time of SLP attempt.  Will return as schedule permits.   Kerrie Pleasure , MA, CCC-SLP Acute Rehabilitation Services Office: [phone]; Pager 7/31: [phone] 02/01/2020, 12:10 PM

## 2020-02-01 NOTE — Progress Notes (Addendum)
Progress Note  Patient Name: Roy Ryan Date of Encounter: 02/01/2020  [facility] Cardiologist: New  Subjective   No complaints  Inpatient Medications    Scheduled Meds: . enoxaparin (LOVENOX) injection  40 mg Subcutaneous Q24H  . folic acid  1 mg Oral Daily  . LORazepam  1 mg Intravenous Q6H  . LORazepam  2 mg Intravenous Q6H  . metoprolol tartrate  25 mg Oral BID  . multivitamin with minerals  1 tablet Oral Daily  . sodium chloride flush  3 mL Intravenous Once  . sodium chloride flush  3 mL Intravenous Q12H  . thiamine  100 mg Oral Daily   Or  . thiamine  100 mg Intravenous Daily   Continuous Infusions:  PRN Meds: acetaminophen **OR** acetaminophen, LORazepam **OR** LORazepam, metoprolol tartrate   Vital Signs    Vitals:   01/31/20 2131 02/01/20 0010 02/01/20 0521 02/01/20 0537  BP: 107/82 (!) 107/86 115/83   Pulse: (!) 116 (!) 106 (!) 115   Resp: 16 16 14    Temp: 99.5 F (37.5 C) 98.6 F (37 C)  97.6 F (36.4 C)  TempSrc: Oral Oral  Oral  SpO2: 99% 100% 99%   Weight:   61.6 kg   Height:        Intake/Output Summary (Last 24 hours) at 02/01/2020 0713 Last data filed at 02/01/2020 0500 Gross per 24 hour  Intake 243 ml  Output 425 ml  Net -182 ml   Last 3 Weights 02/01/2020 01/31/2020 01/31/2020  Weight (lbs) 135 lb 12.8 oz 165 lb 178 lb 9.2 oz  Weight (kg) 61.598 kg 74.844 kg 81 kg      Telemetry    Aflutter 110s - Personally Reviewed  ECG    n/a - Personally Reviewed  Physical Exam   GEN: No acute distress.   Neck: No JVD Cardiac: Regular, 3/6 systolic murmur apex, no jvd  Respiratory: Clear to auscultation bilaterally. GI: Soft, nontender, non-distended  MS: No edema; No deformity. Neuro:  Nonfocal  Psych: Normal affect   Labs    High Sensitivity Troponin:   Recent Labs  Lab 01/31/20 0140 01/31/20 0336 01/31/20 1249 01/31/20 1753  TROPONINIHS 12 35* 21* 21*      Chemistry Recent Labs  Lab 01/31/20 0140 01/31/20 1753  02/01/20 0339  NA 138  --  137  K 3.8  --  3.7  CL 103  --  105  CO2 19*  --  22  GLUCOSE 118*  --  80  BUN 8  --  8  CREATININE 1.07 0.97 1.06  CALCIUM 9.1  --  8.8*  PROT  --   --  6.4*  ALBUMIN  --   --  3.1*  AST  --   --  19  ALT  --   --  14  ALKPHOS  --   --  98  BILITOT  --   --  4.5*  GFRNONAA >60 >60 >60  GFRAA >60 >60 >60  ANIONGAP 16*  --  10     Hematology Recent Labs  Lab 01/31/20 0140 01/31/20 1753 02/01/20 0339  WBC 4.4 9.6 9.2  RBC 4.53 4.67 4.74  HGB 13.7 13.7 14.0  HCT 43.1 42.9 43.7  MCV 95.1 91.9 92.2  MCH 30.2 29.3 29.5  MCHC 31.8 31.9 32.0  RDW 13.8 13.9 13.7  PLT 125* 116* 101*    BNP Recent Labs  Lab 01/31/20 1240  BNP 195.5*     DDimer No results  for input(s): DDIMER in the last 168 hours.   Radiology    DG Chest 2 View  Result Date: 01/31/2020 CLINICAL DATA:  Chest pain EXAM: CHEST - 2 VIEW COMPARISON:  None. FINDINGS: Cardiac shadow is enlarged. No focal infiltrate or sizable effusion is seen. Midthoracic compression deformity is noted of uncertain chronicity. IMPRESSION: Midthoracic compression deformity of uncertain chronicity. No other focal abnormality is noted. Electronically Signed   By: Alcide Clever M.D.   On: 01/31/2020 02:09   CT Head Wo Contrast  Result Date: 01/31/2020 CLINICAL DATA:  Seizure EXAM: CT HEAD WITHOUT CONTRAST TECHNIQUE: Contiguous axial images were obtained from the base of the skull through the vertex without intravenous contrast. COMPARISON:  None. FINDINGS: Brain: There is moderate diffuse atrophy. There is no intracranial mass, hemorrhage, extra-axial fluid collection, or midline shift. There is patchy small vessel disease in the centra semiovale bilaterally. No acute appearing infarct is demonstrable. Vascular: No hyperdense vessel. There is calcification in each carotid siphon region. Skull: The bony calvarium appears intact. Sinuses/Orbits: There is opacification of the visualized right maxillary  antrum. There is mild mucosal thickening in several ethmoid air cells. Orbits appear symmetric bilaterally. Other: Mastoid air cells are clear. IMPRESSION: Atrophy with periventricular small vessel disease. No demonstrable acute infarct. No evident mass or hemorrhage. There are foci of arterial vascular calcification. Extensive paranasal sinus disease involving the right maxillary antrum. Mucosal thickening noted in several ethmoid air cells, primarily on the right. Electronically Signed   By: Bretta Bang III M.D.   On: 01/31/2020 06:43   CT Angio Chest PE W and/or Wo Contrast  Result Date: 01/31/2020 CLINICAL DATA:  Shortness of breath and chest pain EXAM: CT ANGIOGRAPHY CHEST WITH CONTRAST TECHNIQUE: Multidetector CT imaging of the chest was performed using the standard protocol during bolus administration of intravenous contrast. Multiplanar CT image reconstructions and MIPs were obtained to evaluate the vascular anatomy. CONTRAST:  63mL OMNIPAQUE IOHEXOL 350 MG/ML SOLN COMPARISON:  Chest radiograph January 31, 2020 FINDINGS: Cardiovascular: There is no appreciable pulmonary embolus. There is no thoracic aortic aneurysm. No dissection evident. Note that the contrast bolus within the aorta is not sufficient for assessment for potential dissection. Visualized great vessels appear unremarkable. There are occasional foci of aortic atherosclerosis. There are foci of coronary artery calcification. There is no pericardial effusion or pericardial thickening. Mediastinum/Nodes: No evident thyroid lesions. No evident thoracic adenopathy. No esophageal lesions evident. Lungs/Pleura: There is scarring in the apices, more on the right than on the left. There is ill-defined airspace opacity in the posterior segment of the right upper lobe with several areas of tree on bud type appearance. A focal area of opacity is noted in the superior segment of the left lower lobe measuring 1.7 x 1.6 cm. There is probable atelectatic  change elsewhere in the lower lobes with questionable small focus of pneumonia in the posterior aspect of the superior segment right lower lobe. Note that there is a degree of underlying centrilobular emphysematous change. No pleural effusions are evident. Upper Abdomen: There is reflux of contrast into the inferior vena cava and hepatic veins. Visualized upper abdominal structures otherwise appear normal. Musculoskeletal: There is moderately severe wedging of the T8 vertebral body. There is milder anterior wedging at T6 and T7. No blastic or lytic bone lesions evident. No chest wall lesions. Review of the MIP images confirms the above findings. IMPRESSION: 1. No demonstrable pulmonary embolus. No thoracic aortic aneurysm. No dissection evident with proviso that the contrast bolus  in the aorta is not sufficient for dissection assessment. There is aortic atherosclerosis as well as foci of coronary artery calcification. 2. Infiltrate in the posterior segment of the right upper lobe consistent with pneumonia. 3. Suspect pneumonia abutting the pleura in the superior segment of the left lower lobe. This area measures 1.7 x 1.6 cm. A neoplastic focus with mild surrounding inflammation could present in this manner. This finding warrants noncontrast enhanced chest CT to further evaluate this area after appropriate treatment for pneumonia in approximately 4-6 weeks. 4. Areas of probable atelectasis in the lower lung regions elsewhere with questionable mild developing pneumonia in the posterior aspect of the superior segment right lower lobe. Note a degree of underlying emphysematous change. 5. Reflux of contrast into the inferior vena cava and hepatic veins may indicate a degree of increase in right heart pressure. 6.  No evident adenopathy. 7. Age uncertain wedge compression fractures in the thoracic spine, most severe at T8. Aortic Atherosclerosis (ICD10-I70.0) and Emphysema (ICD10-J43.9). Electronically Signed   By:  Bretta Bang III M.D.   On: 01/31/2020 06:50   ECHOCARDIOGRAM COMPLETE  Result Date: 01/31/2020    ECHOCARDIOGRAM REPORT   Patient Name:   Roy Ryan Date of Exam: 01/31/2020 Medical Rec #:  [id number]    Height:       74.0 in Accession #:    [id number]   Weight:       165.0 lb Date of Birth:  December 07, 1950     BSA:          2.003 m Patient Age:    68 years     BP:           128/92 mmHg Patient Gender: M            HR:           98 bpm. Exam Location:  Inpatient Procedure: 2D Echo, 3D Echo, Color Doppler and Cardiac Doppler Indications:    R07.9* Chest pain, unspecified  History:        Patient has no prior history of Echocardiogram examinations.  Sonographer:    Irving Burton Senior RDCS Referring Phys: [id number] JARED E SEGAL IMPRESSIONS  1. Left ventricular ejection fraction, by estimation, is 50 to 55%. The left ventricle has low normal function. The left ventricle has no regional wall motion abnormalities. Left ventricular diastolic parameters are indeterminate.  2. Right ventricular systolic function is moderately reduced. The right ventricular size is normal. There is moderately elevated pulmonary artery systolic pressure. The estimated right ventricular systolic pressure is 53.7 mmHg.  3. Left atrial size was severely dilated.  4. Right atrial size was moderately dilated.  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 The anterior leaflet is mildly elongated. Normal mobility of the mitral valve leaflets. Moderate mitral valve regurgitation, with eccentric anteriorly directed jet. No evidence of mitral valve stenosis.  6. The aortic valve is tricuspid. Aortic valve regurgitation is not visualized. Mild aortic valve sclerosis is present, with no evidence of aortic valve stenosis.  7. The inferior vena cava is dilated in size with >50% respiratory variability, suggesting right atrial pressure of 8 mmHg.  8. Consider TEE  for further evaluation of mital valve. FINDINGS  Left Ventricle: Left ventricular ejection fraction, by estimation, is 50 to 55%. The left ventricle has low normal function. The left ventricle has no regional wall motion abnormalities. The left ventricular internal cavity size  was normal in size. There is no left ventricular hypertrophy. Left ventricular diastolic parameters are indeterminate. Right Ventricle: The right ventricular size is normal. No increase in right ventricular wall thickness. Right ventricular systolic function is moderately reduced. There is moderately elevated pulmonary artery systolic pressure. The tricuspid regurgitant velocity is 3.38 m/s, and with an assumed right atrial pressure of 8 mmHg, the estimated right ventricular systolic pressure is 53.7 mmHg. Left Atrium: Left atrial size was severely dilated. Right Atrium: Right atrial size was moderately dilated. Pericardium: There is no evidence of pericardial effusion. 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 The anterior leaflet is mildly elongated. Normal mobility of the mitral valve leaflets. Moderate mitral valve regurgitation, with eccentric anteriorly directed jet. No evidence of mitral valve stenosis. Tricuspid Valve: The tricuspid valve is normal in structure. Tricuspid valve regurgitation is mild . No evidence of tricuspid stenosis. Aortic Valve: The aortic valve is tricuspid. Aortic valve regurgitation is mild. Mild aortic valve sclerosis is present, with no evidence of aortic valve stenosis. Pulmonic Valve: The pulmonic valve was normal in structure. Pulmonic valve regurgitation is not visualized. No evidence of pulmonic stenosis. Aorta: The aortic root is normal in size and structure. Venous: The inferior vena cava is dilated in size with greater than 50% respiratory variability, suggesting right atrial pressure of 8  mmHg. IAS/Shunts: No atrial level shunt detected by color flow Doppler.  LEFT VENTRICLE PLAX 2D LVIDd:         5.40 cm LVIDs:         3.90 cm LV PW:         0.90 cm LV IVS:        1.00 cm LVOT diam:     2.10 cm LV SV:         43 LV SV Index:   21 LVOT Area:     3.46 cm  RIGHT VENTRICLE RV S prime:     9.14 cm/s TAPSE (M-mode): 1.4 cm LEFT ATRIUM              Index       RIGHT ATRIUM           Index LA diam:        3.90 cm  1.95 cm/m  RA Area:     25.40 cm LA Vol (A2C):   178.0 ml 88.87 ml/m RA Volume:   79.80 ml  39.84 ml/m LA Vol (A4C):   79.4 ml  39.64 ml/m LA Biplane Vol: 128.0 ml 63.91 ml/m  AORTIC VALVE LVOT Vmax:   86.60 cm/s LVOT Vmean:  57.200 cm/s LVOT VTI:    0.124 m  AORTA Ao Root diam: 3.40 cm MR Peak grad:    86.5 mmHg   TRICUSPID VALVE MR Mean grad:    55.0 mmHg   TR Peak grad:   45.7 mmHg MR Vmax:         465.00 cm/s TR Vmax:        338.00 cm/s MR Vmean:        353.0 cm/s MR PISA:         4.02 cm    SHUNTS MR PISA Eff ROA: 29 mm      Systemic VTI:  0.12 m MR PISA Radius:  0.80 cm     Systemic Diam: 2.10 cm Armanda Magicraci Turner MD Electronically signed by Armanda Magicraci Turner MD Signature Date/Time: 01/31/2020/12:49:32 PM  Final     Cardiac Studies     Patient Profile     Roy Ryan is a 69 y.o. homeless male with a history of alcohol abuse who is being seen today for the evaluation of dyspnea on exertion and chest pain at the request of Dr. Dairl Ponder.  Assessment & Plan    1. Dyspnea/Chest pain -BNP 195, CXR no acute process. - CT PE no PE, +infiltrate RUL likely pneumonia - echo LVEF 50-55%, no WMAs, indet DDx, mod RV dysfunction, PASP 54 -mild trop up to 35 trending back down in setting of seizure, aflutter with RVR.   - initial consulting team has ordered myoview for today.    2. Pulmonary HTN - by echo PASP 54, mod RV dysfunction  3. Moderate mitral regurgitation - would follow as outpatient, no plans for inpatient TEE - if establishes consistent follow up as outpatient could  consider outpatient study   4. EtOH abuse/Withdrawal - admitted with seizure like episode  5. Aflutter - new diagnosis this admission - started on lopressor - CHADS2Vasc score is 1, with history of EtOH abuse and low CHADS2 Vasc would avoid anticoag at this time.    - rates remain elevated, increase lopressor ot 37.5mg  bid   For questions or updates, please contact [facility] Please consult [email or web address] for contact info under        Signed, Dina Rich, MD  02/01/2020, 7:13 AM

## 2020-02-01 NOTE — Progress Notes (Addendum)
   Called down to proctor nuc. Seen down in nuc med, had resting images done, states "I feel fine and I want to go home." Refuses to proceed with Lexiscan portion of stress test and refuses to sign consent. In atrial flutter 2:1 with HRs in the 1-teens down in nuc med, asymptomatic. Notified Dr. Wyline Mood - anticipate patient will likely leave AMA. Per Dr. Wyline Mood continue metoprolol which was increased to 37.5mg  BID this AM. We will tentatively arrange f/u in our office in a few weeks next available APP appt - appt placed on AVs. Would also suggest a primary care appointment within 1 week of leaving the hospital.  Ronie Spies PA-C

## 2020-02-01 NOTE — Care Management Important Message (Signed)
Important Message  Patient Details  Name: Roy Ryan MRN: [id number] Date of Birth: [birth year]   Medicare Important Message Given:   Yes     Isaias Cowman, RN 02/01/2020, 9:29 AM

## 2020-02-01 NOTE — TOC Initial Note (Signed)
Transition of Care [facility]) - Initial/Assessment Note    Patient Details  Name: Roy Ryan MRN: [id number] Date of Birth: June 03, 1951  Transition of [facility]) CM/SW Contact:    Norina Buzzard, RN Phone Number: 02/01/2020, 9:58 AM  Clinical Narrative:  Pt presented to the ED via EMS for seizure-like episode that happened on the evening of admission, he is also complaining of chest pain with exertion that started 2 days prior to admission. PMH of alcohol abuse. Received referral to assist with substance abuse.   Met with pt. He lives in a motel with his girlfriend. He reports that he doesn't have a car. He takes a cab when needed. He doesn't have a PCP. Pt has Medicare. Discussed with pt the importance of having a PCP and he needs to f/u with a physician after he is D/C from the hospital. Provided pt with a [facility] brochure. Encouraged pt to contact the center on Monday morning if he is D/C over the weekend and to make an appointment. He reports that he doesn't take any medications, but he can afford them if ordered.  Attempted to discuss referral for substance abuse, but he is not interested. He reports that he just wants to go home. Provided pt with a list of substance treatment services in [facility].    Expected Discharge Plan: Home/Self Care Barriers to Discharge: Active Substance Use - Placement   Patient Goals and CMS Choice Patient states their goals for this hospitalization and ongoing recovery are:: He is ready to be D/C.      Expected Discharge Plan and Services Expected Discharge Plan: Home/Self Care   Discharge Planning Services: CM Consult   Living arrangements for the past 2 months: Hotel/Motel                                      Prior Living Arrangements/Services Living arrangements for the past 2 months: Hotel/Motel Lives with:: Significant Other Patient language and need for interpreter reviewed:: Yes Do you  feel safe going back to the place where you live?: Yes               Activities of Daily Living      Permission Sought/Granted Permission sought to share information with : Case Manager                Emotional Assessment Appearance:: Appears stated age Attitude/Demeanor/Rapport: Engaged Affect (typically observed): Accepting, Pleasant, Calm Orientation: : Oriented to Self, Oriented to Place, Oriented to  Time, Oriented to Situation Alcohol / Substance Use: Alcohol Use    Admission diagnosis:  Seizure ([place]) [R56.9] Chest pain [R07.9] Alcohol withdrawal syndrome without complication ([place]) [S50.539] Chest pain, unspecified type [R07.9] Patient Active Problem List   Diagnosis Date Noted  . Chest pain 01/31/2020   PCP:  Patient, No Pcp Per Pharmacy:   [facility] [place] [place] [address] [place] [address] Phone: [phone] Fax: [phone]     Social Determinants of Health ([place]) Interventions    Readmission Risk Interventions No flowsheet data found.

## 2020-02-01 NOTE — Progress Notes (Signed)
[facility] PROGRESS NOTE    Progress Note  Roy Ryan  YOV:[id number] DOB: 1950-08-10 DOA: 01/31/2020 PCP: Patient, No Pcp Per     Brief Narrative:   Roy Ryan is an 69 y.o. male past medical history of alcohol abuse who presents to the ED via EMS for seizure-like episode that happened on the evening of admission, he is also complaining of chest pain with exertion that started 2 days prior to admission.  He received aspirin and sublingual nitroglycerin.  CT angio of the chest was negative for PE in the ED but it did show right lower lobe pneumonia.  Assessment/Plan:   Alcohol withdrawal with possible seizures: He relates his last drink was a day prior to admission he only drinks beer. He has no known history of seizures. He was started on the CIWA protocol, started on thiamine and folate.  Right lower lobe infiltrate: He has remained afebrile with no leukocytosis agree with holding antibiotics. There is an area about 1.7 to 1.6 cm with surrounding mild inflammatory changes that will warrant a noncontrasted CT of the chest in 4 to 6 weeks for further evaluation.  As of reactive lymphadenopathy.  A. Flutter: New admission with a chads Vascor is 1 in the setting of alcohol withdrawal, not a candidate for anticoagulation at this time.  Exertional chest pain: With an EKG showing sinus tachycardia in the setting of alcohol withdrawal. CT angio of the chest was negative for PE but it did showed a right lower lobe infiltrate he remain afebrile with leukocytosis continue to hold antibiotics for now. There is a slight bump in his cardiac biomarkers which is basically remained flat. A1c5.4, Echo of 50% no wall motion abnormality. He is scheduled for a [employer] today.  Pulmonary hypertension: By echo with a pulmonary pressure 54 moderate RV dysfunction. Further management per cardiology.   DVT prophylaxis: lovenox Family Communication:none Status is: Observation  The  patient remains OBS appropriate and will d/c before 2 midnights.  Dispo: The patient is from: Home              Anticipated d/c is to: Home              Anticipated d/c date is: 3 days              Patient currently is not medically stable to d/c.        Code Status:     Code Status Orders  (From admission, onward)         Start     Ordered   01/31/20 1523  Full code  Continuous        01/31/20 1522        Code Status History    This patient has a current code status but no historical code status.   Advance Care Planning Activity        IV Access:    Peripheral IV   Procedures and diagnostic studies:   DG Chest 2 View  Result Date: 01/31/2020 CLINICAL DATA:  Chest pain EXAM: CHEST - 2 VIEW COMPARISON:  None. FINDINGS: Cardiac shadow is enlarged. No focal infiltrate or sizable effusion is seen. Midthoracic compression deformity is noted of uncertain chronicity. IMPRESSION: Midthoracic compression deformity of uncertain chronicity. No other focal abnormality is noted. Electronically Signed   By: Alcide Clever M.D.   On: 01/31/2020 02:09   CT Head Wo Contrast  Result Date: 01/31/2020 CLINICAL DATA:  Seizure EXAM: CT HEAD WITHOUT CONTRAST TECHNIQUE: Contiguous  axial images were obtained from the base of the skull through the vertex without intravenous contrast. COMPARISON:  None. FINDINGS: Brain: There is moderate diffuse atrophy. There is no intracranial mass, hemorrhage, extra-axial fluid collection, or midline shift. There is patchy small vessel disease in the centra semiovale bilaterally. No acute appearing infarct is demonstrable. Vascular: No hyperdense vessel. There is calcification in each carotid siphon region. Skull: The bony calvarium appears intact. Sinuses/Orbits: There is opacification of the visualized right maxillary antrum. There is mild mucosal thickening in several ethmoid air cells. Orbits appear symmetric bilaterally. Other: Mastoid air cells are  clear. IMPRESSION: Atrophy with periventricular small vessel disease. No demonstrable acute infarct. No evident mass or hemorrhage. There are foci of arterial vascular calcification. Extensive paranasal sinus disease involving the right maxillary antrum. Mucosal thickening noted in several ethmoid air cells, primarily on the right. Electronically Signed   By: Bretta BangWilliam  Woodruff III M.D.   On: 01/31/2020 06:43   CT Angio Chest PE W and/or Wo Contrast  Result Date: 01/31/2020 CLINICAL DATA:  Shortness of breath and chest pain EXAM: CT ANGIOGRAPHY CHEST WITH CONTRAST TECHNIQUE: Multidetector CT imaging of the chest was performed using the standard protocol during bolus administration of intravenous contrast. Multiplanar CT image reconstructions and MIPs were obtained to evaluate the vascular anatomy. CONTRAST:  90mL OMNIPAQUE IOHEXOL 350 MG/ML SOLN COMPARISON:  Chest radiograph January 31, 2020 FINDINGS: Cardiovascular: There is no appreciable pulmonary embolus. There is no thoracic aortic aneurysm. No dissection evident. Note that the contrast bolus within the aorta is not sufficient for assessment for potential dissection. Visualized great vessels appear unremarkable. There are occasional foci of aortic atherosclerosis. There are foci of coronary artery calcification. There is no pericardial effusion or pericardial thickening. Mediastinum/Nodes: No evident thyroid lesions. No evident thoracic adenopathy. No esophageal lesions evident. Lungs/Pleura: There is scarring in the apices, more on the right than on the left. There is ill-defined airspace opacity in the posterior segment of the right upper lobe with several areas of tree on bud type appearance. A focal area of opacity is noted in the superior segment of the left lower lobe measuring 1.7 x 1.6 cm. There is probable atelectatic change elsewhere in the lower lobes with questionable small focus of pneumonia in the posterior aspect of the superior segment right  lower lobe. Note that there is a degree of underlying centrilobular emphysematous change. No pleural effusions are evident. Upper Abdomen: There is reflux of contrast into the inferior vena cava and hepatic veins. Visualized upper abdominal structures otherwise appear normal. Musculoskeletal: There is moderately severe wedging of the T8 vertebral body. There is milder anterior wedging at T6 and T7. No blastic or lytic bone lesions evident. No chest wall lesions. Review of the MIP images confirms the above findings. IMPRESSION: 1. No demonstrable pulmonary embolus. No thoracic aortic aneurysm. No dissection evident with proviso that the contrast bolus in the aorta is not sufficient for dissection assessment. There is aortic atherosclerosis as well as foci of coronary artery calcification. 2. Infiltrate in the posterior segment of the right upper lobe consistent with pneumonia. 3. Suspect pneumonia abutting the pleura in the superior segment of the left lower lobe. This area measures 1.7 x 1.6 cm. A neoplastic focus with mild surrounding inflammation could present in this manner. This finding warrants noncontrast enhanced chest CT to further evaluate this area after appropriate treatment for pneumonia in approximately 4-6 weeks. 4. Areas of probable atelectasis in the lower lung regions elsewhere with questionable mild  developing pneumonia in the posterior aspect of the superior segment right lower lobe. Note a degree of underlying emphysematous change. 5. Reflux of contrast into the inferior vena cava and hepatic veins may indicate a degree of increase in right heart pressure. 6.  No evident adenopathy. 7. Age uncertain wedge compression fractures in the thoracic spine, most severe at T8. Aortic Atherosclerosis (ICD10-I70.0) and Emphysema (ICD10-J43.9). Electronically Signed   By: Bretta Bang III M.D.   On: 01/31/2020 06:50   ECHOCARDIOGRAM COMPLETE  Result Date: 01/31/2020    ECHOCARDIOGRAM REPORT    Patient Name:   Roy Ryan Date of Exam: 01/31/2020 Medical Rec #:  [id number]    Height:       74.0 in Accession #:    [id number]   Weight:       165.0 lb Date of Birth:  [birth year]     BSA:          2.003 m Patient Age:    68 years     BP:           128/92 mmHg Patient Gender: M            HR:           98 bpm. Exam Location:  Inpatient Procedure: 2D Echo, 3D Echo, Color Doppler and Cardiac Doppler Indications:    R07.9* Chest pain, unspecified  History:        Patient has no prior history of Echocardiogram examinations.  Sonographer:    Irving Burton Senior RDCS Referring Phys: [id number] JARED E SEGAL IMPRESSIONS  1. Left ventricular ejection fraction, by estimation, is 50 to 55%. The left ventricle has low normal function. The left ventricle has no regional wall motion abnormalities. Left ventricular diastolic parameters are indeterminate.  2. Right ventricular systolic function is moderately reduced. The right ventricular size is normal. There is moderately elevated pulmonary artery systolic pressure. The estimated right ventricular systolic pressure is 53.7 mmHg.  3. Left atrial size was severely dilated.  4. Right atrial size was moderately dilated.  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 The anterior leaflet is mildly elongated. Normal mobility of the mitral valve leaflets. Moderate mitral valve regurgitation, with eccentric anteriorly directed jet. No evidence of mitral valve stenosis.  6. The aortic valve is tricuspid. Aortic valve regurgitation is not visualized. Mild aortic valve sclerosis is present, with no evidence of aortic valve stenosis.  7. The inferior vena cava is dilated in size with >50% respiratory variability, suggesting right atrial pressure of 8 mmHg.  8. Consider TEE for further evaluation of mital valve. FINDINGS  Left Ventricle: Left ventricular ejection fraction, by estimation, is 50 to 55%.  The left ventricle has low normal function. The left ventricle has no regional wall motion abnormalities. The left ventricular internal cavity size was normal in size. There is no left ventricular hypertrophy. Left ventricular diastolic parameters are indeterminate. Right Ventricle: The right ventricular size is normal. No increase in right ventricular wall thickness. Right ventricular systolic function is moderately reduced. There is moderately elevated pulmonary artery systolic pressure. The tricuspid regurgitant velocity is 3.38 m/s, and with an assumed right atrial pressure of 8 mmHg, the estimated right ventricular systolic pressure is 53.7 mmHg. Left Atrium: Left atrial size was severely dilated. Right Atrium: Right atrial size was moderately dilated. Pericardium: There is no evidence of pericardial effusion. Mitral Valve: The mitral valve is myxomatous. There  is moderate holosystolic prolapse of the medial scallop of the posterior leaflet of the mitral valve. The anterior leaflet is mildly elongated. Normal mobility of the mitral valve leaflets. Moderate mitral valve regurgitation, with eccentric anteriorly directed jet. No evidence of mitral valve stenosis. Tricuspid Valve: The tricuspid valve is normal in structure. Tricuspid valve regurgitation is mild . No evidence of tricuspid stenosis. Aortic Valve: The aortic valve is tricuspid. Aortic valve regurgitation is mild. Mild aortic valve sclerosis is present, with no evidence of aortic valve stenosis. Pulmonic Valve: The pulmonic valve was normal in structure. Pulmonic valve regurgitation is not visualized. No evidence of pulmonic stenosis. Aorta: The aortic root is normal in size and structure. Venous: The inferior vena cava is dilated in size with greater than 50% respiratory variability, suggesting right atrial pressure of 8 mmHg. IAS/Shunts: No atrial level shunt detected by color flow Doppler.  LEFT VENTRICLE PLAX 2D LVIDd:         5.40 cm LVIDs:          3.90 cm LV PW:         0.90 cm LV IVS:        1.00 cm LVOT diam:     2.10 cm LV SV:         43 LV SV Index:   21 LVOT Area:     3.46 cm  RIGHT VENTRICLE RV S prime:     9.14 cm/s TAPSE (M-mode): 1.4 cm LEFT ATRIUM              Index       RIGHT ATRIUM           Index LA diam:        3.90 cm  1.95 cm/m  RA Area:     25.40 cm LA Vol (A2C):   178.0 ml 88.87 ml/m RA Volume:   79.80 ml  39.84 ml/m LA Vol (A4C):   79.4 ml  39.64 ml/m LA Biplane Vol: 128.0 ml 63.91 ml/m  AORTIC VALVE LVOT Vmax:   86.60 cm/s LVOT Vmean:  57.200 cm/s LVOT VTI:    0.124 m  AORTA Ao Root diam: 3.40 cm MR Peak grad:    86.5 mmHg   TRICUSPID VALVE MR Mean grad:    55.0 mmHg   TR Peak grad:   45.7 mmHg MR Vmax:         465.00 cm/s TR Vmax:        338.00 cm/s MR Vmean:        353.0 cm/s MR PISA:         4.02 cm    SHUNTS MR PISA Eff ROA: 29 mm      Systemic VTI:  0.12 m MR PISA Radius:  0.80 cm     Systemic Diam: 2.10 cm Armanda Magic MD Electronically signed by Armanda Magic MD Signature Date/Time: 01/31/2020/12:49:32 PM    Final      Medical Consultants:    None.  Anti-Infectives:   None  Subjective:    Roy Ryan he is complaining that he is tired of being here and he is hungry.  Objective:    Vitals:   02/01/20 0010 02/01/20 0521 02/01/20 0537 02/01/20 0734  BP: (!) 107/86 115/83  106/83  Pulse: (!) 106 (!) 115  (!) 116  Resp: 16 14  20   Temp: 98.6 F (37 C)  97.6 F (36.4 C)   TempSrc: Oral  Oral   SpO2: 100% 99%  98%  Weight:  61.6 kg    Height:       SpO2: 98 %   Intake/Output Summary (Last 24 hours) at 02/01/2020 0808 Last data filed at 02/01/2020 0500 Gross per 24 hour  Intake 243 ml  Output 425 ml  Net -182 ml   Filed Weights   01/31/20 0132 01/31/20 0647 02/01/20 0521  Weight: 81 kg 74.8 kg 61.6 kg    Exam: General exam: In no acute distress. Respiratory system: Good air movement and clear to auscultation. Cardiovascular system: S1 & S2 heard, RRR. No JVD. Gastrointestinal  system: Abdomen is nondistended, soft and nontender.  Extremities: No pedal edema. Skin: No rashes, lesions or ulcers Psychiatry: Judgement and insight appear normal. Mood & affect appropriate.    Data Reviewed:    Labs: Basic Metabolic Panel: Recent Labs  Lab 01/31/20 0140 01/31/20 1753 02/01/20 0339  NA 138  --  137  K 3.8  --  3.7  CL 103  --  105  CO2 19*  --  22  GLUCOSE 118*  --  80  BUN 8  --  8  CREATININE 1.07 0.97 1.06  CALCIUM 9.1  --  8.8*  MG  --  2.3  --   PHOS  --  3.8  --    GFR Estimated Creatinine Clearance: 59.7 mL/min (by C-G formula based on SCr of 1.06 mg/dL). Liver Function Tests: Recent Labs  Lab 02/01/20 0339  AST 19  ALT 14  ALKPHOS 98  BILITOT 4.5*  PROT 6.4*  ALBUMIN 3.1*   No results for input(s): LIPASE, AMYLASE in the last 168 hours. No results for input(s): AMMONIA in the last 168 hours. Coagulation profile No results for input(s): INR, PROTIME in the last 168 hours. COVID-19 Labs  No results for input(s): DDIMER, FERRITIN, LDH, CRP in the last 72 hours.  Lab Results  Component Value Date   SARSCOV2NAA NEGATIVE 01/31/2020    CBC: Recent Labs  Lab 01/31/20 0140 01/31/20 1753 02/01/20 0339  WBC 4.4 9.6 9.2  HGB 13.7 13.7 14.0  HCT 43.1 42.9 43.7  MCV 95.1 91.9 92.2  PLT 125* 116* 101*   Cardiac Enzymes: No results for input(s): CKTOTAL, CKMB, CKMBINDEX, TROPONINI in the last 168 hours. BNP (last 3 results) No results for input(s): PROBNP in the last 8760 hours. CBG: No results for input(s): GLUCAP in the last 168 hours. D-Dimer: No results for input(s): DDIMER in the last 72 hours. Hgb A1c: Recent Labs    01/31/20 0930  HGBA1C 5.4   Lipid Profile: Recent Labs    01/31/20 0930  CHOL 107  HDL 43  LDLCALC 58  TRIG 29  CHOLHDL 2.5   Thyroid function studies: No results for input(s): TSH, T4TOTAL, T3FREE, THYROIDAB in the last 72 hours.  Invalid input(s): FREET3 Anemia work up: No results for  input(s): VITAMINB12, FOLATE, FERRITIN, TIBC, IRON, RETICCTPCT in the last 72 hours. Sepsis Labs: Recent Labs  Lab 01/31/20 0140 01/31/20 1753 02/01/20 0339  WBC 4.4 9.6 9.2   Microbiology Recent Results (from the past 240 hour(s))  SARS Coronavirus 2 by RT PCR (hospital order, performed in [facility] hospital lab) Nasopharyngeal Nasopharyngeal Swab     Status: None   Collection Time: 01/31/20  6:53 AM   Specimen: Nasopharyngeal Swab  Result Value Ref Range Status   SARS Coronavirus 2 NEGATIVE NEGATIVE Final    Comment: (NOTE) SARS-CoV-2 target nucleic acids are NOT DETECTED.  The SARS-CoV-2 RNA is generally detectable in upper and lower respiratory  specimens during the acute phase of infection. The lowest concentration of SARS-CoV-2 viral copies this assay can detect is 250 copies / mL. A negative result does not preclude SARS-CoV-2 infection and should not be used as the sole basis for treatment or other patient management decisions.  A negative result may occur with improper specimen collection / handling, submission of specimen other than nasopharyngeal swab, presence of viral mutation(s) within the areas targeted by this assay, and inadequate number of viral copies (<250 copies / mL). A negative result must be combined with clinical observations, patient history, and epidemiological information.  Fact Sheet for Patients:   BoilerBrush.com.cy  Fact Sheet for Healthcare Providers: https://pope.com/  This test is not yet approved or  cleared by the [place] FDA and has been authorized for detection and/or diagnosis of SARS-CoV-2 by FDA under an Emergency Use Authorization (EUA).  This EUA will remain in effect (meaning this test can be used) for the duration of the COVID-19 declaration under Section 564(b)(1) of the Act, 21 U.S.C. section 360bbb-3(b)(1), unless the authorization is terminated or revoked  sooner.  Performed at [facility], [address], [place], [place] [address]      Medications:   . enoxaparin (LOVENOX) injection  40 mg Subcutaneous Q24H  . folic acid  1 mg Oral Daily  . LORazepam  1 mg Intravenous Q6H  . LORazepam  2 mg Intravenous Q6H  . metoprolol tartrate  37.5 mg Oral BID  . multivitamin with minerals  1 tablet Oral Daily  . sodium chloride flush  3 mL Intravenous Once  . sodium chloride flush  3 mL Intravenous Q12H  . thiamine  100 mg Oral Daily   Or  . thiamine  100 mg Intravenous Daily   Continuous Infusions:    LOS: 0 days   Marinda Elk  [facility]  02/01/2020, 8:08 AM

## 2020-02-01 NOTE — Progress Notes (Addendum)
Patient has remained lethargic for duration of PM shift; he is A&Ox4 and rouses with effort, however he does not stay awake for long and either cannot or will not fully comply with commands. CIWA has therefore remained uncharted. Will continue to monitor and document as able.  Due to ongoing lethargy, ativan has yet to be provided during PM shift. MD is aware of this. No sign of seizure activity as of this time. Will continue to monitor.

## 2020-02-01 NOTE — Progress Notes (Signed)
D Dunn PA explained stress test to patient, does not want the test therefore test is cancelled. RN on 3E aware.

## 2020-02-06 ENCOUNTER — Ambulatory Visit (HOSPITAL_COMMUNITY): Payer: Medicare Other

## 2020-02-12 NOTE — Discharge Summary (Signed)
Physician Discharge Summary  Roy Ryan WJX:[id number] DOB: 1951-04-04 DOA: 01/31/2020  PCP: Patient, No Pcp Per  Admit date: 01/31/2020 Discharge date: 02/12/2020        LEFT AMA Admitted From: home Disposition:  Home  Recommendations for Outpatient Follow-up:  1. Follow-up with cardiology as an outpatient for stress testing. 2. Follow-up with PCP and perform a CT scan of the chest in 4 to 6 weeks to evaluate inflammatory changes seen on noncontrasted CT.  Home Health:None Equipment/Devices:None  Discharge Condition:Stable CODE STATUS:Full Diet recommendation: Heart Healthy   Brief/Interim Summary: 69 y.o. male past medical history of alcohol abuse who presents to the ED via EMS for seizure-like episode that happened on the evening of admission, he is also complaining of chest pain with exertion that started 2 days prior to admission.  He received aspirin and sublingual nitroglycerin.  CT angio of the chest was negative for PE in the ED but it did show right lower lobe pneumonia  Discharge Diagnoses:  Active Problems:   Chest pain  Alcohol withdrawal with possible seizures: He was started on thiamine and folate, monitor with CIWA protocol showed no signs of seizure withdrawal.  Right lower lobe infiltrate: He remained afebrile with no leukocytosis, there was about a 1.7 to 1.6 cm mouth inflammatory changes on noncontrast head CT. No need a CT scan in 4 to 6 weeks for further evaluation.  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  Exertional chest pain: With EKG changes showing sinus tachycardia in the setting of alcohol withdrawal, CT angio of the chest was negative for PE.  There was a slight bump in cardiac biomarkers, his A1c was five-point four 2D echo showed an EF of 50% with no wall motion abnormalities. Cardiology was consulted a Lexiscan Myoview was partially completed as  the patient refused to continue the test. He will follow up with cardiology as an outpatient.  Pulmonary hypertension: With pulmonary pressure of 54 with right RV dysfunction follow-up with cardiology as an outpatient.  Discharge Instructions   Allergies as of 02/01/2020   No Known Allergies     Medication List    You have not been prescribed any medications.     Follow-up Information    [facility]ale[facility] Course, [place] Follow up.   Specialties: Cardiology, Radiology Why: [facility] location - a follow-up has been made for you as below on Thursday February 27, 2020 at 11:15 AM (Arrive by 11:00 AM). Wynema Birch is one of the PAs that works closely with our cardiology team. Contact information: [address] [place] [address] [phone]              No Known Allergies  Consultations:  Cardiology   Procedures/Studies: DG Chest 2 View  Result Date: 01/31/2020 CLINICAL DATA:  Chest pain EXAM: CHEST - 2 VIEW COMPARISON:  None. FINDINGS: Cardiac shadow is enlarged. No focal infiltrate or sizable effusion is seen. Midthoracic compression deformity is noted of uncertain chronicity. IMPRESSION: Midthoracic compression deformity of uncertain chronicity. No other focal abnormality is noted. Electronically Signed   By: Alcide Clever M.D.   On: 01/31/2020 02:09   CT Head Wo Contrast  Result Date: 01/31/2020 CLINICAL DATA:  Seizure EXAM: CT HEAD WITHOUT CONTRAST TECHNIQUE: Contiguous axial images were obtained from the base of the skull through the vertex without intravenous contrast. COMPARISON:  None. FINDINGS: Brain: There is moderate diffuse atrophy.  There is no intracranial mass, hemorrhage, extra-axial fluid collection, or midline shift. There is patchy small vessel disease in the centra semiovale bilaterally. No acute appearing infarct is demonstrable. Vascular: No hyperdense vessel. There is calcification in each carotid siphon region. Skull: The bony calvarium appears  intact. Sinuses/Orbits: There is opacification of the visualized right maxillary antrum. There is mild mucosal thickening in several ethmoid air cells. Orbits appear symmetric bilaterally. Other: Mastoid air cells are clear. IMPRESSION: Atrophy with periventricular small vessel disease. No demonstrable acute infarct. No evident mass or hemorrhage. There are foci of arterial vascular calcification. Extensive paranasal sinus disease involving the right maxillary antrum. Mucosal thickening noted in several ethmoid air cells, primarily on the right. Electronically Signed   By: Bretta BangWilliam  Woodruff III M.D.   On: 01/31/2020 06:43   CT Angio Chest PE W and/or Wo Contrast  Result Date: 01/31/2020 CLINICAL DATA:  Shortness of breath and chest pain EXAM: CT ANGIOGRAPHY CHEST WITH CONTRAST TECHNIQUE: Multidetector CT imaging of the chest was performed using the standard protocol during bolus administration of intravenous contrast. Multiplanar CT image reconstructions and MIPs were obtained to evaluate the vascular anatomy. CONTRAST:  90mL OMNIPAQUE IOHEXOL 350 MG/ML SOLN COMPARISON:  Chest radiograph January 31, 2020 FINDINGS: Cardiovascular: There is no appreciable pulmonary embolus. There is no thoracic aortic aneurysm. No dissection evident. Note that the contrast bolus within the aorta is not sufficient for assessment for potential dissection. Visualized great vessels appear unremarkable. There are occasional foci of aortic atherosclerosis. There are foci of coronary artery calcification. There is no pericardial effusion or pericardial thickening. Mediastinum/Nodes: No evident thyroid lesions. No evident thoracic adenopathy. No esophageal lesions evident. Lungs/Pleura: There is scarring in the apices, more on the right than on the left. There is ill-defined airspace opacity in the posterior segment of the right upper lobe with several areas of tree on bud type appearance. A focal area of opacity is noted in the superior  segment of the left lower lobe measuring 1.7 x 1.6 cm. There is probable atelectatic change elsewhere in the lower lobes with questionable small focus of pneumonia in the posterior aspect of the superior segment right lower lobe. Note that there is a degree of underlying centrilobular emphysematous change. No pleural effusions are evident. Upper Abdomen: There is reflux of contrast into the inferior vena cava and hepatic veins. Visualized upper abdominal structures otherwise appear normal. Musculoskeletal: There is moderately severe wedging of the T8 vertebral body. There is milder anterior wedging at T6 and T7. No blastic or lytic bone lesions evident. No chest wall lesions. Review of the MIP images confirms the above findings. IMPRESSION: 1. No demonstrable pulmonary embolus. No thoracic aortic aneurysm. No dissection evident with proviso that the contrast bolus in the aorta is not sufficient for dissection assessment. There is aortic atherosclerosis as well as foci of coronary artery calcification. 2. Infiltrate in the posterior segment of the right upper lobe consistent with pneumonia. 3. Suspect pneumonia abutting the pleura in the superior segment of the left lower lobe. This area measures 1.7 x 1.6 cm. A neoplastic focus with mild surrounding inflammation could present in this manner. This finding warrants noncontrast enhanced chest CT to further evaluate this area after appropriate treatment for pneumonia in approximately 4-6 weeks. 4. Areas of probable atelectasis in the lower lung regions elsewhere with questionable mild developing pneumonia in the posterior aspect of the superior segment right lower lobe. Note a degree of underlying emphysematous change. 5. Reflux of contrast into the  inferior vena cava and hepatic veins may indicate a degree of increase in right heart pressure. 6.  No evident adenopathy. 7. Age uncertain wedge compression fractures in the thoracic spine, most severe at T8. Aortic  Atherosclerosis (ICD10-I70.0) and Emphysema (ICD10-J43.9). Electronically Signed   By: Bretta Bang III M.D.   On: 01/31/2020 06:50   ECHOCARDIOGRAM COMPLETE  Result Date: 01/31/2020    ECHOCARDIOGRAM REPORT   Patient Name:   Roy Ryan Date of Exam: 01/31/2020 Medical Rec #:  [id number]    Height:       74.0 in Accession #:    [id number]   Weight:       165.0 lb Date of Birth:  [birth year]     BSA:          2.003 m Patient Age:    68 years     BP:           128/92 mmHg Patient Gender: M            HR:           98 bpm. Exam Location:  Inpatient Procedure: 2D Echo, 3D Echo, Color Doppler and Cardiac Doppler Indications:    R07.9* Chest pain, unspecified  History:        Patient has no prior history of Echocardiogram examinations.  Sonographer:    Irving Burton Senior RDCS Referring Phys: [id number] JARED E SEGAL IMPRESSIONS  1. Left ventricular ejection fraction, by estimation, is 50 to 55%. The left ventricle has low normal function. The left ventricle has no regional wall motion abnormalities. Left ventricular diastolic parameters are indeterminate.  2. Right ventricular systolic function is moderately reduced. The right ventricular size is normal. There is moderately elevated pulmonary artery systolic pressure. The estimated right ventricular systolic pressure is 53.7 mmHg.  3. Left atrial size was severely dilated.  4. Right atrial size was moderately dilated.  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 The anterior leaflet is mildly elongated. Normal mobility of the mitral valve leaflets. Moderate mitral valve regurgitation, with eccentric anteriorly directed jet. No evidence of mitral valve stenosis.  6. The aortic valve is tricuspid. Aortic valve regurgitation is not visualized. Mild aortic valve sclerosis is present, with no evidence of aortic valve stenosis.  7. The inferior vena cava is dilated in size with >50%  respiratory variability, suggesting right atrial pressure of 8 mmHg.  8. Consider TEE for further evaluation of mital valve. FINDINGS  Left Ventricle: Left ventricular ejection fraction, by estimation, is 50 to 55%. The left ventricle has low normal function. The left ventricle has no regional wall motion abnormalities. The left ventricular internal cavity size was normal in size. There is no left ventricular hypertrophy. Left ventricular diastolic parameters are indeterminate. Right Ventricle: The right ventricular size is normal. No increase in right ventricular wall thickness. Right ventricular systolic function is moderately reduced. There is moderately elevated pulmonary artery systolic pressure. The tricuspid regurgitant velocity is 3.38 m/s, and with an assumed right atrial pressure of 8 mmHg, the estimated right ventricular systolic pressure is 53.7 mmHg. Left Atrium: Left atrial size was severely dilated. Right Atrium: Right atrial size was moderately dilated. Pericardium: There is no evidence of pericardial effusion. 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 The anterior leaflet is mildly elongated. Normal mobility of the  mitral valve leaflets. Moderate mitral valve regurgitation, with eccentric anteriorly directed jet. No evidence of mitral valve stenosis. Tricuspid Valve: The tricuspid valve is normal in structure. Tricuspid valve regurgitation is mild . No evidence of tricuspid stenosis. Aortic Valve: The aortic valve is tricuspid. Aortic valve regurgitation is mild. Mild aortic valve sclerosis is present, with no evidence of aortic valve stenosis. Pulmonic Valve: The pulmonic valve was normal in structure. Pulmonic valve regurgitation is not visualized. No evidence of pulmonic stenosis. Aorta: The aortic root is normal in size and structure. Venous: The inferior vena cava is dilated in size  with greater than 50% respiratory variability, suggesting right atrial pressure of 8 mmHg. IAS/Shunts: No atrial level shunt detected by color flow Doppler.  LEFT VENTRICLE PLAX 2D LVIDd:         5.40 cm LVIDs:         3.90 cm LV PW:         0.90 cm LV IVS:        1.00 cm LVOT diam:     2.10 cm LV SV:         43 LV SV Index:   21 LVOT Area:     3.46 cm  RIGHT VENTRICLE RV S prime:     9.14 cm/s TAPSE (M-mode): 1.4 cm LEFT ATRIUM              Index       RIGHT ATRIUM           Index LA diam:        3.90 cm  1.95 cm/m  RA Area:     25.40 cm LA Vol (A2C):   178.0 ml 88.87 ml/m RA Volume:   79.80 ml  39.84 ml/m LA Vol (A4C):   79.4 ml  39.64 ml/m LA Biplane Vol: 128.0 ml 63.91 ml/m  AORTIC VALVE LVOT Vmax:   86.60 cm/s LVOT Vmean:  57.200 cm/s LVOT VTI:    0.124 m  AORTA Ao Root diam: 3.40 cm MR Peak grad:    86.5 mmHg   TRICUSPID VALVE MR Mean grad:    55.0 mmHg   TR Peak grad:   45.7 mmHg MR Vmax:         465.00 cm/s TR Vmax:        338.00 cm/s MR Vmean:        353.0 cm/s MR PISA:         4.02 cm    SHUNTS MR PISA Eff ROA: 29 mm      Systemic VTI:  0.12 m MR PISA Radius:  0.80 cm     Systemic Diam: 2.10 cm Armanda Magic MD Electronically signed by Armanda Magic MD Signature Date/Time: 01/31/2020/12:49:32 PM    Final      Subjective: No complains  Discharge Exam: Vitals:   02/01/20 0734 02/01/20 1119  BP: 106/83 (!) 112/92  Pulse: (!) 116   Resp: 20   Temp: (!) 97 F (36.1 C)   SpO2: 98%    Vitals:   02/01/20 0521 02/01/20 0537 02/01/20 0734 02/01/20 1119  BP: 115/83  106/83 (!) 112/92  Pulse: (!) 115  (!) 116   Resp: 14  20   Temp:  97.6 F (36.4 C) (!) 97 F (36.1 C)   TempSrc:  Oral Oral   SpO2: 99%  98%   Weight: 61.6 kg     Height:        General: Pt is alert, awake, not in acute distress  Cardiovascular: RRR, S1/S2 +, no rubs, no gallops Respiratory: CTA bilaterally, no wheezing, no rhonchi Abdominal: Soft, NT, ND, bowel sounds + Extremities: no edema, no  cyanosis    The results of significant diagnostics from this hospitalization (including imaging, microbiology, ancillary and laboratory) are listed below for reference.     Microbiology: No results found for this or any previous visit (from the past 240 hour(s)).   Labs: BNP (last 3 results) Recent Labs    01/31/20 1240  BNP 195.5*   Basic Metabolic Panel: No results for input(s): NA, K, CL, CO2, GLUCOSE, BUN, CREATININE, CALCIUM, MG, PHOS in the last 168 hours. Liver Function Tests: No results for input(s): AST, ALT, ALKPHOS, BILITOT, PROT, ALBUMIN in the last 168 hours. No results for input(s): LIPASE, AMYLASE in the last 168 hours. No results for input(s): AMMONIA in the last 168 hours. CBC: No results for input(s): WBC, NEUTROABS, HGB, HCT, MCV, PLT in the last 168 hours. Cardiac Enzymes: No results for input(s): CKTOTAL, CKMB, CKMBINDEX, TROPONINI in the last 168 hours. BNP: Invalid input(s): POCBNP CBG: No results for input(s): GLUCAP in the last 168 hours. D-Dimer No results for input(s): DDIMER in the last 72 hours. Hgb A1c No results for input(s): HGBA1C in the last 72 hours. Lipid Profile No results for input(s): CHOL, HDL, LDLCALC, TRIG, CHOLHDL, LDLDIRECT in the last 72 hours. Thyroid function studies No results for input(s): TSH, T4TOTAL, T3FREE, THYROIDAB in the last 72 hours.  Invalid input(s): FREET3 Anemia work up No results for input(s): VITAMINB12, FOLATE, FERRITIN, TIBC, IRON, RETICCTPCT in the last 72 hours. Urinalysis No results found for: COLORURINE, APPEARANCEUR, LABSPEC, PHURINE, GLUCOSEU, HGBUR, BILIRUBINUR, KETONESUR, PROTEINUR, UROBILINOGEN, NITRITE, LEUKOCYTESUR Sepsis Labs Invalid input(s): PROCALCITONIN,  WBC,  LACTICIDVEN Microbiology No results found for this or any previous visit (from the past 240 hour(s)).   Time coordinating discharge: Over 30 minutes  SIGNED:   Marinda Elk, MD  [facility] 02/12/2020, 7:06  AM Pager   If 7PM-7AM, please contact night-coverage [email or web address] Password [email or web address]

## 2020-02-27 ENCOUNTER — Encounter: Payer: Medicare Other | Admitting: Physician Assistant

## 2020-02-27 NOTE — Progress Notes (Signed)
This encounter was created in error - please disregard.

## 2021-02-22 ENCOUNTER — Emergency Department (HOSPITAL_COMMUNITY): Payer: Medicare Other

## 2021-02-22 ENCOUNTER — Inpatient Hospital Stay (HOSPITAL_COMMUNITY)
Admission: EM | Admit: 2021-02-22 | Discharge: 2021-03-14 | DRG: 643 | Disposition: A | Discharge status: Skilled Nursing Facility | Payer: Medicare Other | Attending: Internal Medicine | Admitting: Internal Medicine

## 2021-02-22 DIAGNOSIS — R0602 Shortness of breath: Secondary | ICD-10-CM

## 2021-02-22 DIAGNOSIS — A419 Sepsis, unspecified organism: Secondary | ICD-10-CM | POA: Diagnosis present

## 2021-02-22 DIAGNOSIS — E162 Hypoglycemia, unspecified: Secondary | ICD-10-CM | POA: Diagnosis present

## 2021-02-22 DIAGNOSIS — Z515 Encounter for palliative care: Secondary | ICD-10-CM

## 2021-02-22 DIAGNOSIS — I4892 Unspecified atrial flutter: Secondary | ICD-10-CM | POA: Diagnosis present

## 2021-02-22 DIAGNOSIS — Z66 Do not resuscitate: Secondary | ICD-10-CM | POA: Diagnosis not present

## 2021-02-22 DIAGNOSIS — E43 Unspecified severe protein-calorie malnutrition: Secondary | ICD-10-CM | POA: Diagnosis present

## 2021-02-22 DIAGNOSIS — D61818 Other pancytopenia: Secondary | ICD-10-CM | POA: Diagnosis not present

## 2021-02-22 DIAGNOSIS — R54 Age-related physical debility: Secondary | ICD-10-CM | POA: Diagnosis present

## 2021-02-22 DIAGNOSIS — S12500A Unspecified displaced fracture of sixth cervical vertebra, initial encounter for closed fracture: Secondary | ICD-10-CM | POA: Diagnosis present

## 2021-02-22 DIAGNOSIS — R6520 Severe sepsis without septic shock: Secondary | ICD-10-CM | POA: Diagnosis present

## 2021-02-22 DIAGNOSIS — R531 Weakness: Secondary | ICD-10-CM

## 2021-02-22 DIAGNOSIS — Z5900 Homelessness unspecified: Secondary | ICD-10-CM

## 2021-02-22 DIAGNOSIS — K7200 Acute and subacute hepatic failure without coma: Secondary | ICD-10-CM | POA: Diagnosis not present

## 2021-02-22 DIAGNOSIS — I4891 Unspecified atrial fibrillation: Secondary | ICD-10-CM | POA: Diagnosis not present

## 2021-02-22 DIAGNOSIS — E038 Other specified hypothyroidism: Secondary | ICD-10-CM | POA: Diagnosis present

## 2021-02-22 DIAGNOSIS — R4182 Altered mental status, unspecified: Secondary | ICD-10-CM | POA: Diagnosis present

## 2021-02-22 DIAGNOSIS — E860 Dehydration: Secondary | ICD-10-CM | POA: Diagnosis present

## 2021-02-22 DIAGNOSIS — R109 Unspecified abdominal pain: Secondary | ICD-10-CM

## 2021-02-22 DIAGNOSIS — I5033 Acute on chronic diastolic (congestive) heart failure: Secondary | ICD-10-CM | POA: Diagnosis not present

## 2021-02-22 DIAGNOSIS — E876 Hypokalemia: Secondary | ICD-10-CM | POA: Diagnosis present

## 2021-02-22 DIAGNOSIS — Z681 Body mass index (BMI) 19 or less, adult: Secondary | ICD-10-CM | POA: Diagnosis not present

## 2021-02-22 DIAGNOSIS — I340 Nonrheumatic mitral (valve) insufficiency: Secondary | ICD-10-CM | POA: Diagnosis present

## 2021-02-22 DIAGNOSIS — I2699 Other pulmonary embolism without acute cor pulmonale: Secondary | ICD-10-CM | POA: Diagnosis not present

## 2021-02-22 DIAGNOSIS — Z20822 Contact with and (suspected) exposure to covid-19: Secondary | ICD-10-CM | POA: Diagnosis not present

## 2021-02-22 DIAGNOSIS — J9601 Acute respiratory failure with hypoxia: Secondary | ICD-10-CM | POA: Diagnosis not present

## 2021-02-22 DIAGNOSIS — I5031 Acute diastolic (congestive) heart failure: Secondary | ICD-10-CM | POA: Diagnosis not present

## 2021-02-22 DIAGNOSIS — R64 Cachexia: Secondary | ICD-10-CM | POA: Diagnosis present

## 2021-02-22 DIAGNOSIS — I9589 Other hypotension: Secondary | ICD-10-CM | POA: Diagnosis not present

## 2021-02-22 DIAGNOSIS — I110 Hypertensive heart disease with heart failure: Secondary | ICD-10-CM | POA: Diagnosis present

## 2021-02-22 DIAGNOSIS — I959 Hypotension, unspecified: Secondary | ICD-10-CM | POA: Diagnosis not present

## 2021-02-22 DIAGNOSIS — E441 Mild protein-calorie malnutrition: Secondary | ICD-10-CM | POA: Diagnosis not present

## 2021-02-22 DIAGNOSIS — S22059A Unspecified fracture of T5-T6 vertebra, initial encounter for closed fracture: Secondary | ICD-10-CM | POA: Diagnosis present

## 2021-02-22 DIAGNOSIS — F1010 Alcohol abuse, uncomplicated: Secondary | ICD-10-CM | POA: Diagnosis not present

## 2021-02-22 DIAGNOSIS — K709 Alcoholic liver disease, unspecified: Secondary | ICD-10-CM | POA: Diagnosis present

## 2021-02-22 DIAGNOSIS — G928 Other toxic encephalopathy: Secondary | ICD-10-CM | POA: Diagnosis not present

## 2021-02-22 DIAGNOSIS — E2740 Unspecified adrenocortical insufficiency: Secondary | ICD-10-CM | POA: Diagnosis not present

## 2021-02-22 DIAGNOSIS — S12400A Unspecified displaced fracture of fifth cervical vertebra, initial encounter for closed fracture: Secondary | ICD-10-CM | POA: Diagnosis present

## 2021-02-22 DIAGNOSIS — Z7189 Other specified counseling: Secondary | ICD-10-CM

## 2021-02-22 DIAGNOSIS — E8809 Other disorders of plasma-protein metabolism, not elsewhere classified: Secondary | ICD-10-CM | POA: Diagnosis present

## 2021-02-22 DIAGNOSIS — I5020 Unspecified systolic (congestive) heart failure: Secondary | ICD-10-CM | POA: Diagnosis not present

## 2021-02-22 DIAGNOSIS — I272 Pulmonary hypertension, unspecified: Secondary | ICD-10-CM | POA: Diagnosis not present

## 2021-02-22 DIAGNOSIS — I951 Orthostatic hypotension: Secondary | ICD-10-CM | POA: Diagnosis not present

## 2021-02-22 DIAGNOSIS — D696 Thrombocytopenia, unspecified: Secondary | ICD-10-CM | POA: Diagnosis not present

## 2021-02-22 DIAGNOSIS — F10239 Alcohol dependence with withdrawal, unspecified: Secondary | ICD-10-CM | POA: Diagnosis present

## 2021-02-22 DIAGNOSIS — R579 Shock, unspecified: Secondary | ICD-10-CM | POA: Diagnosis not present

## 2021-02-22 DIAGNOSIS — E861 Hypovolemia: Secondary | ICD-10-CM | POA: Diagnosis not present

## 2021-02-22 DIAGNOSIS — T68XXXA Hypothermia, initial encounter: Secondary | ICD-10-CM | POA: Diagnosis present

## 2021-02-22 DIAGNOSIS — I341 Nonrheumatic mitral (valve) prolapse: Secondary | ICD-10-CM | POA: Diagnosis not present

## 2021-02-22 DIAGNOSIS — R627 Adult failure to thrive: Secondary | ICD-10-CM | POA: Diagnosis not present

## 2021-02-22 DIAGNOSIS — Z9119 Patient's noncompliance with other medical treatment and regimen: Secondary | ICD-10-CM

## 2021-02-22 HISTORY — DX: Alcohol abuse, uncomplicated: F10.10

## 2021-02-22 HISTORY — DX: Nonrheumatic mitral (valve) insufficiency: I34.0

## 2021-02-22 HISTORY — DX: Unspecified atrial flutter: I48.92

## 2021-02-22 LAB — URINALYSIS, ROUTINE W REFLEX MICROSCOPIC
Bacteria, UA: NONE SEEN
Bilirubin Urine: NEGATIVE
Glucose, UA: >=500 mg/dL — AB
Hgb urine dipstick: NEGATIVE
Ketones, ur: 80 mg/dL — AB
Leukocytes,Ua: NEGATIVE
Nitrite: NEGATIVE
Protein, ur: 30 mg/dL — AB
Specific Gravity, Urine: 1.018 (ref 1.005–1.030)
pH: 5 (ref 5.0–8.0)

## 2021-02-22 LAB — CBC WITH DIFFERENTIAL/PLATELET
Abs Immature Granulocytes: 0.01 10*3/uL (ref 0.00–0.07)
Basophils Absolute: 0 10*3/uL (ref 0.0–0.1)
Basophils Relative: 1 %
Eosinophils Absolute: 0.1 10*3/uL (ref 0.0–0.5)
Eosinophils Relative: 4 %
HCT: 31.9 % — ABNORMAL LOW (ref 39.0–52.0)
Hemoglobin: 10.3 g/dL — ABNORMAL LOW (ref 13.0–17.0)
Immature Granulocytes: 0 %
Lymphocytes Relative: 42 %
Lymphs Abs: 1.2 10*3/uL (ref 0.7–4.0)
MCH: 28.7 pg (ref 26.0–34.0)
MCHC: 32.3 g/dL (ref 30.0–36.0)
MCV: 88.9 fL (ref 80.0–100.0)
Monocytes Absolute: 0.3 10*3/uL (ref 0.1–1.0)
Monocytes Relative: 10 %
Neutro Abs: 1.3 10*3/uL — ABNORMAL LOW (ref 1.7–7.7)
Neutrophils Relative %: 43 %
Platelets: 92 10*3/uL — ABNORMAL LOW (ref 150–400)
RBC: 3.59 MIL/uL — ABNORMAL LOW (ref 4.22–5.81)
RDW: 16.1 % — ABNORMAL HIGH (ref 11.5–15.5)
WBC: 3 10*3/uL — ABNORMAL LOW (ref 4.0–10.5)
nRBC: 0 % (ref 0.0–0.2)

## 2021-02-22 LAB — COMPREHENSIVE METABOLIC PANEL
ALT: 16 U/L (ref 0–44)
AST: 31 U/L (ref 15–41)
Albumin: 3 g/dL — ABNORMAL LOW (ref 3.5–5.0)
Alkaline Phosphatase: 75 U/L (ref 38–126)
Anion gap: 14 (ref 5–15)
BUN: 5 mg/dL — ABNORMAL LOW (ref 8–23)
CO2: 22 mmol/L (ref 22–32)
Calcium: 8.5 mg/dL — ABNORMAL LOW (ref 8.9–10.3)
Chloride: 97 mmol/L — ABNORMAL LOW (ref 98–111)
Creatinine, Ser: 0.83 mg/dL (ref 0.61–1.24)
GFR, Estimated: >60 mL/min (ref >60)
Glucose, Bld: 250 mg/dL — ABNORMAL HIGH (ref 70–99)
Potassium: 4.5 mmol/L (ref 3.5–5.1)
Sodium: 133 mmol/L — ABNORMAL LOW (ref 135–145)
Total Bilirubin: 3.5 mg/dL — ABNORMAL HIGH (ref 0.3–1.2)
Total Protein: 6.5 g/dL (ref 6.5–8.1)

## 2021-02-22 LAB — LACTIC ACID, PLASMA: Lactic Acid, Venous: 1.2 mmol/L (ref 0.5–1.9)

## 2021-02-22 LAB — RAPID URINE DRUG SCREEN, HOSP PERFORMED
Amphetamines: NOT DETECTED
Barbiturates: NOT DETECTED
Benzodiazepines: NOT DETECTED
Cocaine: NOT DETECTED
Opiates: NOT DETECTED
Tetrahydrocannabinol: NOT DETECTED

## 2021-02-22 LAB — AMMONIA: Ammonia: 22 umol/L (ref 9–35)

## 2021-02-22 LAB — TROPONIN I (HIGH SENSITIVITY)
Troponin I (High Sensitivity): 6 ng/L (ref <18)
Troponin I (High Sensitivity): 7 ng/L (ref <18)

## 2021-02-22 LAB — RESP PANEL BY RT-PCR (FLU A&B, COVID) ARPGX2
Influenza A by PCR: NEGATIVE
Influenza B by PCR: NEGATIVE
SARS Coronavirus 2 by RT PCR: NEGATIVE

## 2021-02-22 LAB — TSH: TSH: 7.133 u[IU]/mL — ABNORMAL HIGH (ref 0.350–4.500)

## 2021-02-22 LAB — CBG MONITORING, ED
Glucose-Capillary: 135 mg/dL — ABNORMAL HIGH (ref 70–99)
Glucose-Capillary: 127 mg/dL — ABNORMAL HIGH (ref 70–99)

## 2021-02-22 LAB — SALICYLATE LEVEL: Salicylate Lvl: <7 mg/dL — ABNORMAL LOW (ref 7.0–30.0)

## 2021-02-22 LAB — OSMOLALITY: Osmolality: 297 mOsm/kg — ABNORMAL HIGH (ref 275–295)

## 2021-02-22 LAB — GLUCOSE, CAPILLARY: Glucose-Capillary: 139 mg/dL — ABNORMAL HIGH (ref 70–99)

## 2021-02-22 LAB — T4, FREE: Free T4: 1.21 ng/dL — ABNORMAL HIGH (ref 0.61–1.12)

## 2021-02-22 MED ORDER — ACETAMINOPHEN 650 MG RE SUPP: 650.0000 mg | Freq: Four times a day (QID) | RECTAL | Status: DC | PRN

## 2021-02-22 MED ORDER — THIAMINE HCL 100 MG/ML IJ SOLN
100.0000 mg | Freq: Once | INTRAMUSCULAR | Status: AC
  Administered 2021-02-22: 100 mg via INTRAVENOUS
  Filled 2021-02-22: qty 2

## 2021-02-22 MED ORDER — CHLORHEXIDINE GLUCONATE CLOTH 2 % EX PADS
6.0000 | MEDICATED_PAD | Freq: Every day | CUTANEOUS | Status: DC
  Administered 2021-02-23 – 2021-02-25 (×3): 6 via TOPICAL

## 2021-02-22 MED ORDER — LORAZEPAM 1 MG PO TABS: 1.0000 mg | ORAL_TABLET | ORAL | Status: AC | PRN

## 2021-02-22 MED ORDER — FOLIC ACID 1 MG PO TABS
1.0000 mg | ORAL_TABLET | Freq: Every day | ORAL | Status: DC
  Administered 2021-02-23 – 2021-03-14 (×20): 1 mg via ORAL
  Filled 2021-02-22 (×20): qty 1

## 2021-02-22 MED ORDER — FOLIC ACID 5 MG/ML IJ SOLN
1.0000 mg | Freq: Once | INTRAMUSCULAR | Status: AC
  Administered 2021-02-22: 1 mg via INTRAVENOUS
  Filled 2021-02-22: qty 0.2

## 2021-02-22 MED ORDER — ADULT MULTIVITAMIN W/MINERALS CH
1.0000 | ORAL_TABLET | Freq: Every day | ORAL | Status: DC
  Administered 2021-02-23 – 2021-03-14 (×19): 1 via ORAL
  Filled 2021-02-22 (×20): qty 1

## 2021-02-22 MED ORDER — THIAMINE HCL 100 MG/ML IJ SOLN
100.0000 mg | Freq: Every day | INTRAMUSCULAR | Status: DC
  Administered 2021-02-25 – 2021-03-03 (×3): 100 mg via INTRAVENOUS
  Filled 2021-02-22 (×5): qty 2

## 2021-02-22 MED ORDER — SODIUM CHLORIDE 0.9 % IV BOLUS
1000.0000 mL | Freq: Once | INTRAVENOUS | Status: AC
  Administered 2021-02-22: 1000 mL via INTRAVENOUS

## 2021-02-22 MED ORDER — LORAZEPAM 2 MG/ML IJ SOLN: 1.0000 mg | INTRAMUSCULAR | Status: AC | PRN

## 2021-02-22 MED ORDER — ACETAMINOPHEN 325 MG PO TABS
650.0000 mg | ORAL_TABLET | Freq: Four times a day (QID) | ORAL | Status: DC | PRN
  Administered 2021-02-23 – 2021-03-13 (×11): 650 mg via ORAL
  Filled 2021-02-22 (×11): qty 2

## 2021-02-22 MED ORDER — DEXTROSE-NACL 5-0.9 % IV SOLN: INTRAVENOUS | Status: DC

## 2021-02-22 MED ORDER — ENOXAPARIN SODIUM 40 MG/0.4ML IJ SOSY: 40.0000 mg | PREFILLED_SYRINGE | INTRAMUSCULAR | Status: DC

## 2021-02-22 MED ORDER — THIAMINE HCL 100 MG PO TABS
100.0000 mg | ORAL_TABLET | Freq: Every day | ORAL | Status: DC
  Administered 2021-02-23 – 2021-03-14 (×17): 100 mg via ORAL
  Filled 2021-02-22 (×17): qty 1

## 2021-02-22 MED ORDER — SODIUM CHLORIDE 0.9 % IV SOLN: 1.0000 mg | Freq: Once | INTRAVENOUS | Status: DC

## 2021-02-22 NOTE — H&P (Signed)
History and Physical    Roy Ryan DJM:[id number] DOB: December 17, 1950 DOA: 02/22/2021  PCP: Patient, No Pcp Per (Inactive) Patient coming from: [facility]  Chief Complaint: Altered mental status  HPI: Roy Ryan is a 70 y.o. male with medical history significant of alcohol abuse, alcohol withdrawal seizures, atypical flutter, pulmonary hypertension presented to the ED via EMS for evaluation of altered mental status.  Patient is currently residing at a hotel and upon EMS arrival he was found to be hypoglycemic with CBG 37.  He was given dextrose.  CBG initially improved to 78 but then dropped again to 48 and he was given additional dextrose.  EMS reported very poor living conditions, feces and urine in bed, rotting food, multiple alcohol bottles.  Blood pressure was soft and he was given a 500 cc fluid bolus by EMS.  In the ED, patient found to be hypotensive with systolic in the 80s and hypothermic with rectal temperature 95.2 F.  Placed on [name]  Labs showing WBC 3.0, hemoglobin 10.3 (was 14.0 on 02/01/2020), MCV 88.9, platelet count 92k (chronically low).  Sodium 133, potassium 4.5, chloride 97, bicarb 22, BUN 5, creatinine 0.8, glucose 250.  T bili 3.5 (was 4.5 on 02/01/2020).  Remainder of LFTs normal.  High-sensitivity troponin negative x2.  TSH elevated at 7.133.  Free T4 borderline elevated at 1.21.  Salicylate level undetectable.  Serum osmolality 297.  Ammonia level normal.  Lactic acid x1 normal.  COVID and influenza PCR negative.  UA with >= 500 glucose, 80 ketones, and 30 protein.  UDS pending.  Chest x-ray showing no acute cardiopulmonary disease; progression fractures at C5, C6, and C7.  Head CT negative for acute intracranial abnormality. Patient was given folic acid, thiamine, and 3 L normal saline boluses.  Started on D5-normal saline infusion.  History limited as patient appears slightly confused.  Oriented to person and place only.  States he lives at a motel and is not sure why he is  at a hospital.  Reports drinking 3-4 beers daily but has not consumed any alcoholic beverages for the past few days.  States he has not had any food to eat or water to drink at his motel, unclear for how long.  Reports cough.  Denies fevers, shortness of breath, chest pain, nausea, vomiting, abdominal pain, or diarrhea.  No additional history could be obtained from him.  Review of Systems:  All systems reviewed and apart from history of presenting illness, are negative.  Past Medical History:  Diagnosis Date   Seizures (HCC)     No past surgical history on file.   reports that he has never smoked. He has never used smokeless tobacco. He reports that he does not drink alcohol and does not use drugs.  No Known Allergies  Family History  Problem Relation Age of Onset   CAD Neg Hx    Heart failure Neg Hx    Stroke Neg Hx     Prior to Admission medications   Not on File    Physical Exam: Vitals:   02/23/21 0619 02/23/21 0630 02/23/21 0645 02/23/21 0700  BP: 95/71 94/65 95/65  101/71  Pulse:      Resp: 18 15 13 15   Temp:      TempSrc:      SpO2: 100% 98% 100%   Weight:      Height:        Physical Exam Constitutional:      General: He is not in acute distress. HENT:  Head: Normocephalic and atraumatic.  Eyes:     Extraocular Movements: Extraocular movements intact.  Cardiovascular:     Rate and Rhythm: Normal rate and regular rhythm.     Pulses: Normal pulses.  Pulmonary:     Effort: Pulmonary effort is normal. No respiratory distress.     Breath sounds: Normal breath sounds. No wheezing or rales.  Abdominal:     General: Bowel sounds are normal. There is no distension.     Palpations: Abdomen is soft.     Tenderness: There is no abdominal tenderness.  Musculoskeletal:        General: No swelling or tenderness.     Cervical back: Normal range of motion and neck supple.  Skin:    General: Skin is warm and dry.  Neurological:     General: No focal deficit  present.     Mental Status: He is alert.     Comments: Oriented to person and place.  He knows the month is August but thinks the year is 2019.     Labs on Admission: I have personally reviewed following labs and imaging studies  CBC: Recent Labs  Lab 02/22/21 1310  WBC 3.0*  NEUTROABS 1.3*  HGB 10.3*  HCT 31.9*  MCV 88.9  PLT 92*   Basic Metabolic Panel: Recent Labs  Lab 02/22/21 1310 02/23/21 0047  NA 133*  --   K 4.5  --   CL 97*  --   CO2 22  --   GLUCOSE 250*  --   BUN 5*  --   CREATININE 0.83 0.71  CALCIUM 8.5*  --   MG  --  1.6*  PHOS  --  2.5   GFR: Estimated Creatinine Clearance: 67.2 mL/min (by C-G formula based on SCr of 0.71 mg/dL). Liver Function Tests: Recent Labs  Lab 02/22/21 1310  AST 31  ALT 16  ALKPHOS 75  BILITOT 3.5*  PROT 6.5  ALBUMIN 3.0*   No results for input(s): LIPASE, AMYLASE in the last 168 hours. Recent Labs  Lab 02/22/21 1310  AMMONIA 22   Coagulation Profile: No results for input(s): INR, PROTIME in the last 168 hours. Cardiac Enzymes: No results for input(s): CKTOTAL, CKMB, CKMBINDEX, TROPONINI in the last 168 hours. BNP (last 3 results) No results for input(s): PROBNP in the last 8760 hours. HbA1C: No results for input(s): HGBA1C in the last 72 hours. CBG: Recent Labs  Lab 02/22/21 1254 02/22/21 2019 02/22/21 2316 02/23/21 0305  GLUCAP 135* 127* 139* 118*   Lipid Profile: No results for input(s): CHOL, HDL, LDLCALC, TRIG, CHOLHDL, LDLDIRECT in the last 72 hours. Thyroid Function Tests: Recent Labs    02/22/21 1310 02/22/21 1410  TSH 7.133*  --   FREET4  --  1.21*   Anemia Panel: No results for input(s): VITAMINB12, FOLATE, FERRITIN, TIBC, IRON, RETICCTPCT in the last 72 hours. Urine analysis:    Component Value Date/Time   COLORURINE AMBER (A) 02/22/2021 2130   APPEARANCEUR CLEAR 02/22/2021 2130   LABSPEC 1.018 02/22/2021 2130   PHURINE 5.0 02/22/2021 2130   GLUCOSEU >=500 (A) 02/22/2021 2130    HGBUR NEGATIVE 02/22/2021 2130   BILIRUBINUR NEGATIVE 02/22/2021 2130   KETONESUR 80 (A) 02/22/2021 2130   PROTEINUR 30 (A) 02/22/2021 2130   NITRITE NEGATIVE 02/22/2021 2130   LEUKOCYTESUR NEGATIVE 02/22/2021 2130    Radiological Exams on Admission: CT HEAD WO CONTRAST ( )  Result Date: 02/22/2021 CLINICAL DATA:  Delirium EXAM: CT HEAD WITHOUT CONTRAST TECHNIQUE: Contiguous axial  images were obtained from the base of the skull through the vertex without intravenous contrast. COMPARISON:  01/31/2020 FINDINGS: Brain: No evidence of acute infarction, hemorrhage, hydrocephalus, extra-axial collection or mass lesion/mass effect. Mild low-density changes within the periventricular and subcortical white matter compatible with chronic microvascular ischemic change. Mild diffuse cerebral volume loss. Vascular: Atherosclerotic calcifications involving the large vessels of the skull base. No unexpected hyperdense vessel. Skull: Normal. Negative for fracture or focal lesion. Sinuses/Orbits: Chronic complete right maxillary sinus opacification with associated bony thickening of the maxillary sinus walls. Mucosal thickening within the bilateral ethmoid air cells and bilateral frontal sinuses. Air-fluid level in the left frontal sinus. Other: None. IMPRESSION: 1. No acute intracranial findings. 2. Mild chronic microvascular ischemic change and cerebral volume loss. 3. Chronic right maxillary sinus disease. Air-fluid level in the left frontal sinus may represent acute on chronic sinusitis. Electronically Signed   By: Duanne GuessNicholas  Plundo D.O.   On: 02/22/2021 16:02   DG Chest Portable 1 View  Result Date: 02/22/2021 CLINICAL DATA:  Delirium. EXAM: PORTABLE CHEST 1 VIEW COMPARISON:  One-view scratched at two-view chest x-ray and CTA chest 01/31/2020 FINDINGS: Heart is upper limits of normal. 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 No edema or effusion is present. No focal airspace disease present. Degenerative changes are again  noted in the thoracic spine. Progression fractures are again noted at C5, C6, and C7. IMPRESSION: 1. No acute cardiopulmonary disease or significant interval change. 2. Progression fractures at C5, C6, and C7. Electronically Signed   By: Marin Robertshristopher  Mattern M.D.   On: 02/22/2021 14:43    EKG: Pending at this time.  Assessment/Plan Principal Problem:   Hypotension Active Problems:   Atrial fibrillation with rapid ventricular response (HCC)   Hypomagnesemia   Hypoglycemia   Hypothermia   Hypotension Patient was persistently hypotensive with systolic in the 70s and PCCM was consulted.  Blood pressure now improved after aggressive IV fluid resuscitation.  Suspect hypotension is due to severe dehydration, also low cortisol contributing..  Blood cultures drawn and patient was given broad-spectrum antibiotics initially due to concern for severe sepsis in the setting of hypothermia and hypotension.  However, procalcitonin came back <0.10 and no obvious infectious source identified.  Lactic acid normal x2.  UA without signs of infection.  Chest x-ray not suggestive of pneumonia.  COVID/flu negative.  No meningeal signs.  High-sensitivity troponin negative x2.  UDS negative.  PE less likely given no hypoxia. -Continue maintenance IV fluid and monitor blood pressure closely.  Check D-dimer level.  Continue antibiotics at this time, blood cultures pending.  Patient was given IV Solu-Cortef 100 mg x 1.  Cosyntropin test ordered.  New onset A. fib with RVR History of atrial flutter Not on current anticoagulation due to CHA2DS2-VASc score of 1 and history of alcohol abuse.  Currently in A. fib with rate 90-110.  Moderate mitral regurgitation on echo done a year ago. -Repeat echocardiogram.  Avoiding beta-blocker/calcium channel blockers at this time given hypotension.  Repeat echocardiogram ordered.  I have sent a message to cardiology requesting consultation.  Hypoglycemia Likely due to poor oral intake.   Blood glucose initially in the 30s, now improved with IV dextrose.  Most recent CBG 118. -CBG checks every 4 hours, encourage p.o. intake  Hypothermia Rectal temperature initially 95.2 F and was placed on [employer] hug[employer].  Temperature now improved.  Low cortisol likely contributing, ?sepsis/infection. -Continue to monitor  Altered mental status ?Alcohol withdrawal seizure.  Head CT negative for acute intracranial abnormality.  No focal neurodeficit.  Ammonia level normal.  UDS negative.  Currently awake and alert but slightly confused. -EEG, seizure precautions  C-spine fractures Chest x-ray showing progression fractures at C5, C6, and C7.  No neurodeficits at this time. -Consult neurosurgery in a.m.  Pancytopenia Likely related to chronic alcohol use.  Patient is not endorsing any symptoms of GI bleed. -Anemia panel, FOBT, continue to monitor CBC  Subclinical hypothyroidism TSH 7.133 but free T4 only borderline elevated at 1.21. -Will need repeat outpatient thyroid function studies in 2 to 3 months.  Hyperbilirubinemia -Check fractionated bilirubin levels  Hypomagnesemia -Replace magnesium and continue to monitor  Possible sinusitis CT showing chronic right maxillary sinus disease and air-fluid level in the left frontal sinus, may represent acute on chronic sinusitis. -Continue antibiotics  Alcohol abuse -CIWA protocol; Ativan as needed.  Thiamine, folate, and multivitamin.  Monitor mag and Phos levels.  Poor living conditions -Social work consult  DVT prophylaxis: Avoiding chemical DVT prophylaxis given worsening anemia, FOBT pending.  Avoiding SCDs at this time as D-dimer is pending. Code Status: Full code Family Communication: No family available at this time. Disposition Plan: Status is: Inpatient  Remains inpatient appropriate because:Inpatient level of care appropriate due to severity of illness  Dispo: The patient is from: Home              Anticipated d/c is to:  Home              Patient currently is not medically stable to d/c.   Difficult to place patient No  Level of care: Level of care: Stepdown  The medical decision making on this patient was of high complexity and the patient is at high risk for clinical deterioration, therefore this is a level 3 visit.  John Giovanni MD [facility]  If 7PM-7AM, please contact night-coverage [email or web address]  02/23/2021, 7:51 AM

## 2021-02-22 NOTE — ED Triage Notes (Addendum)
Pt to ED via EMS from hotel which is his residence c/o AMS. On EMS arrival pt noted to be hypoglycemic , Initial CBG 37. 25 g dextrose given by EMS. Cbg 78, A&OX 2. Recheck cbg again 48, 25g dextrose given again by EMS. Per EMS very poor living conditions, feces and urine in bed, rotting food, multiple alcohol bottles, pt currently does not smell of ETOH. Last vs: 100/50,  HR 110 Irregular A Fib. RR 16. Limited hx d/t pt is a poor historian. #18 LAC, Fluid bolus given EMS.

## 2021-02-22 NOTE — ED Notes (Signed)
Rectal temp 95.2- pt placed on bear hugger,

## 2021-02-22 NOTE — ED Provider Notes (Signed)
[facility]-EMERGENCY DEPT Provider Note   CSN: [id number] Arrival date & time: 02/22/21  1235     History Chief Complaint  Patient presents with   Altered Mental Status   Hypoglycemia    Ridge Lafond is a 70 y.o. male.  70 yo male with history as below, including ETOH abuse, presenting to ED 2/2 AMS.  Per EMS patient living in hotel.  POC glucose was around 30 upon arrival. Altered, Aox2. Given amp of D25x2.  Bed with feces, urine, empty alcohol bottles around the patient's bed per EMS.  Patient reports that he has been feeling tired over the past few days, not eating much.  Subsiding entirely on beer, no other liquid intake.  Alcohol use just prior to arrival. He drinks daily. Denies hx of ETOH seizure in the past. Patient denies chest pain, nausea, emesis, abdominal pain, fevers or chills, not urinating much and few bowel movements. No BRBPR or melena. No falls, no head trauma.   The history is provided by the patient and the EMS personnel. No language interpreter was used.  Altered Mental Status Presenting symptoms: no confusion   Associated symptoms: no abdominal pain, no agitation, no fever, no headaches, no nausea, no palpitations, no rash and no vomiting   Hypoglycemia Associated symptoms: altered mental status   Associated symptoms: no shortness of breath and no vomiting       Past Medical History:  Diagnosis Date   Seizures [facility])     Patient Active Problem List   Diagnosis Date Noted   Chest pain 01/31/2020    No past surgical history on file.     Family History  Problem Relation Age of Onset   CAD Neg Hx    Heart failure Neg Hx    Stroke Neg Hx     Social History   Tobacco Use   Smoking status: Never   Smokeless tobacco: Never  Substance Use Topics   Alcohol use: Never   Drug use: Never    Home Medications Prior to Admission medications   Not on File    Allergies    Patient has no known allergies.  Review of Systems    Review of Systems  Constitutional:  Positive for appetite change and fatigue. Negative for chills and fever.  HENT:  Negative for facial swelling and trouble swallowing.   Eyes:  Negative for photophobia and visual disturbance.  Respiratory:  Negative for cough and shortness of breath.   Cardiovascular:  Negative for chest pain and palpitations.  Gastrointestinal:  Negative for abdominal pain, nausea and vomiting.  Endocrine: Negative for polydipsia and polyuria.  Genitourinary:  Negative for difficulty urinating and hematuria.  Musculoskeletal:  Negative for gait problem and joint swelling.  Skin:  Negative for pallor and rash.  Neurological:  Negative for syncope and headaches.  Psychiatric/Behavioral:  Negative for agitation and confusion.    Physical Exam Updated Vital Signs BP 90/61   Pulse 86   Temp (!) 95.2 F (35.1 C) (Rectal)   Resp 18   Ht 6\' 2"  (1.88 m)   SpO2 100%   BMI 17.44 kg/m   Physical Exam Vitals and nursing note reviewed.  Constitutional:      General: He is not in acute distress.    Appearance: Normal appearance. He is well-developed. He is not ill-appearing.     Comments: Cachectic   HENT:     Head: Normocephalic and atraumatic.     Right Ear: External ear normal.  Left Ear: External ear normal.     Mouth/Throat:     Mouth: Mucous membranes are moist.  Eyes:     General: No scleral icterus.    Extraocular Movements: Extraocular movements intact.     Pupils: Pupils are equal, round, and reactive to light.  Cardiovascular:     Rate and Rhythm: Normal rate and regular rhythm.     Pulses: Normal pulses.     Heart sounds: Normal heart sounds.  Pulmonary:     Effort: Pulmonary effort is normal. No respiratory distress.     Breath sounds: Normal breath sounds.  Abdominal:     General: Abdomen is flat.     Palpations: Abdomen is soft.     Tenderness: There is no abdominal tenderness.  Musculoskeletal:        General: Normal range of motion.      Cervical back: Normal range of motion.     Right lower leg: No edema.     Left lower leg: No edema.  Skin:    General: Skin is warm and dry.     Capillary Refill: Capillary refill takes less than 2 seconds.  Neurological:     Mental Status: He is alert and oriented to person, place, and time.     GCS: GCS eye subscore is 4. GCS verbal subscore is 5. GCS motor subscore is 6.     Cranial Nerves: Cranial nerves are intact.     Sensory: Sensation is intact.     Motor: Motor function is intact.     Coordination: Coordination is intact.  Psychiatric:        Mood and Affect: Mood normal.        Behavior: Behavior normal.    ED Results / Procedures / Treatments   Labs (all labs ordered are listed, but only abnormal results are displayed) Labs Reviewed  CBC WITH DIFFERENTIAL/PLATELET - Abnormal; Notable for the following components:      Result Value   WBC 3.0 (*)    RBC 3.59 (*)    Hemoglobin 10.3 (*)    HCT 31.9 (*)    RDW 16.1 (*)    Platelets 92 (*)    Neutro Abs 1.3 (*)    All other components within normal limits  COMPREHENSIVE METABOLIC PANEL - Abnormal; Notable for the following components:   Sodium 133 (*)    Chloride 97 (*)    Glucose, Bld 250 (*)    BUN 5 (*)    Calcium 8.5 (*)    Albumin 3.0 (*)    Total Bilirubin 3.5 (*)    All other components within normal limits  TSH - Abnormal; Notable for the following components:   TSH 7.133 (*)    All other components within normal limits  SALICYLATE LEVEL - Abnormal; Notable for the following components:   Salicylate Lvl <7.0 (*)    All other components within normal limits  CBG MONITORING, ED - Abnormal; Notable for the following components:   Glucose-Capillary 135 (*)    All other components within normal limits  RESP PANEL BY RT-PCR (FLU A&B, COVID) ARPGX2  AMMONIA  URINALYSIS, ROUTINE W REFLEX MICROSCOPIC  T4, FREE  OSMOLALITY  RAPID URINE DRUG SCREEN, HOSP PERFORMED  LACTIC ACID, PLASMA  LACTIC ACID, PLASMA   TROPONIN I (HIGH SENSITIVITY)  TROPONIN I (HIGH SENSITIVITY)    EKG None  Radiology CT HEAD WO CONTRAST (5MM)  Result Date: 02/22/2021 CLINICAL DATA:  Delirium EXAM: CT HEAD WITHOUT CONTRAST TECHNIQUE: Contiguous  axial images were obtained from the base of the skull through the vertex without intravenous contrast. COMPARISON:  01/31/2020 FINDINGS: Brain: No evidence of acute infarction, hemorrhage, hydrocephalus, extra-axial collection or mass lesion/mass effect. Mild low-density changes within the periventricular and subcortical white matter compatible with chronic microvascular ischemic change. Mild diffuse cerebral volume loss. Vascular: Atherosclerotic calcifications involving the large vessels of the skull base. No unexpected hyperdense vessel. Skull: Normal. Negative for fracture or focal lesion. Sinuses/Orbits: Chronic complete right maxillary sinus opacification with associated bony thickening of the maxillary sinus walls. Mucosal thickening within the bilateral ethmoid air cells and bilateral frontal sinuses. Air-fluid level in the left frontal sinus. Other: None. IMPRESSION: 1. No acute intracranial findings. 2. Mild chronic microvascular ischemic change and cerebral volume loss. 3. Chronic right maxillary sinus disease. Air-fluid level in the left frontal sinus may represent acute on chronic sinusitis. Electronically Signed   By: Duanne Guess D.O.   On: 02/22/2021 16:02   DG Chest Portable 1 View  Result Date: 02/22/2021 CLINICAL DATA:  Delirium. EXAM: PORTABLE CHEST 1 VIEW COMPARISON:  One-view scratched at two-view chest x-ray and CTA chest 01/31/2020 FINDINGS: Heart is upper limits of normal. 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 No edema or effusion is present. No focal airspace disease present. Degenerative changes are again noted in the thoracic spine. Progression fractures are again noted at C5, C6, and C7. IMPRESSION: 1. No acute cardiopulmonary disease or significant interval change.  2. Progression fractures at C5, C6, and C7. Electronically Signed   By: Marin Roberts M.D.   On: 02/22/2021 14:43    Procedures Procedures   Medications Ordered in ED Medications  dextrose 5 %-0.9 % sodium chloride infusion ( Intravenous New Bag/Given 02/22/21 1412)  folic acid injection 1 mg (has no administration in time range)  sodium chloride 0.9 % bolus 1,000 mL (has no administration in time range)  thiamine (B-1) injection 100 mg (100 mg Intravenous Given 02/22/21 1407)  sodium chloride 0.9 % bolus 1,000 mL (1,000 mLs Intravenous New Bag/Given 02/22/21 1611)    ED Course  I have reviewed the triage vital signs and the nursing notes.  Pertinent labs & imaging results that were available during my care of the patient were reviewed by me and considered in my medical decision making (see chart for details).    MDM Rules/Calculators/A&P                           This is 70 yo male with history as above to ED 2/2 AMS, hypoglycemia. No hx diabetes, not on diabetic medication. Given dextrose by EMS. Started on Dextrose infusion in ED. Glucose monitoring started  This patient complains of AMS; this involves an extensive number of treatment Options and is a complaint that carries with it a high risk of complications and Morbidity. Vital signs reviewed, mildly hypotensive, concern for hypovolemia. Hypothermic, placed on bair hugger. Vitals otherwise are stable. Serious etiologies considered.   I ordered, reviewed and interpreted labs, CBC with leukopenia at 3.0. Hemoglobin reduced from baseline at 10.3; previous around 13. Platelets low, consistent with chronic ETOH abuse.   Seizure precautions ordered, hx seizure in the past but does not follow with neuro or take AED, favor provoked seizure a/w ETOH abuse per chart review. No seizure activity in ED or reported by EMS.   I ordered medication thiamine, folic acid, D5NS infusion.   I ordered imaging studies which included CXR,  CTH.  Patient  re-assessed, he is Aox3 and neuro exam remains stable. He reports he is feeling somewhat better. He is mildly hypotensive and given IVF, hemodynamics improved following this. Etiology of symptoms today concerning for possible starvation ketosis, malnutrition, chronic ETOH abuse.   Care at this time signed out to incoming team. Patient will likely require admission given the findings as noted above.     Final Clinical Impression(s) / ED Diagnoses Final diagnoses:  Dehydration  Alcohol abuse  Hypoglycemia    Rx / DC Orders ED Discharge Orders     None        Sloan Leiter, DO 02/22/21 1645

## 2021-02-23 ENCOUNTER — Other Ambulatory Visit: Payer: Self-pay

## 2021-02-23 ENCOUNTER — Inpatient Hospital Stay (HOSPITAL_COMMUNITY)
Admission: EM | Admit: 2021-02-23 | Discharge: 2021-02-23 | Disposition: A | Discharge status: Home / Self Care | Payer: Medicare Other | Source: Home / Self Care | Attending: Internal Medicine | Admitting: Internal Medicine

## 2021-02-23 ENCOUNTER — Inpatient Hospital Stay (HOSPITAL_COMMUNITY): Payer: Medicare Other

## 2021-02-23 ENCOUNTER — Encounter (HOSPITAL_COMMUNITY): Payer: Self-pay | Admitting: Internal Medicine

## 2021-02-23 DIAGNOSIS — R627 Adult failure to thrive: Secondary | ICD-10-CM

## 2021-02-23 DIAGNOSIS — F1010 Alcohol abuse, uncomplicated: Secondary | ICD-10-CM

## 2021-02-23 DIAGNOSIS — D696 Thrombocytopenia, unspecified: Secondary | ICD-10-CM | POA: Insufficient documentation

## 2021-02-23 DIAGNOSIS — I959 Hypotension, unspecified: Secondary | ICD-10-CM | POA: Diagnosis present

## 2021-02-23 DIAGNOSIS — I4891 Unspecified atrial fibrillation: Secondary | ICD-10-CM | POA: Diagnosis not present

## 2021-02-23 DIAGNOSIS — R4182 Altered mental status, unspecified: Secondary | ICD-10-CM | POA: Diagnosis not present

## 2021-02-23 DIAGNOSIS — R6520 Severe sepsis without septic shock: Secondary | ICD-10-CM

## 2021-02-23 DIAGNOSIS — E162 Hypoglycemia, unspecified: Secondary | ICD-10-CM

## 2021-02-23 DIAGNOSIS — A419 Sepsis, unspecified organism: Secondary | ICD-10-CM

## 2021-02-23 DIAGNOSIS — I9589 Other hypotension: Secondary | ICD-10-CM | POA: Diagnosis not present

## 2021-02-23 DIAGNOSIS — E8809 Other disorders of plasma-protein metabolism, not elsewhere classified: Secondary | ICD-10-CM

## 2021-02-23 DIAGNOSIS — T68XXXA Hypothermia, initial encounter: Secondary | ICD-10-CM | POA: Diagnosis present

## 2021-02-23 DIAGNOSIS — I5020 Unspecified systolic (congestive) heart failure: Secondary | ICD-10-CM

## 2021-02-23 DIAGNOSIS — E860 Dehydration: Secondary | ICD-10-CM

## 2021-02-23 DIAGNOSIS — E43 Unspecified severe protein-calorie malnutrition: Secondary | ICD-10-CM | POA: Diagnosis present

## 2021-02-23 DIAGNOSIS — K709 Alcoholic liver disease, unspecified: Secondary | ICD-10-CM | POA: Insufficient documentation

## 2021-02-23 DIAGNOSIS — E441 Mild protein-calorie malnutrition: Secondary | ICD-10-CM

## 2021-02-23 DIAGNOSIS — R579 Shock, unspecified: Secondary | ICD-10-CM

## 2021-02-23 DIAGNOSIS — E861 Hypovolemia: Secondary | ICD-10-CM

## 2021-02-23 LAB — GLUCOSE, CAPILLARY
Glucose-Capillary: 118 mg/dL — ABNORMAL HIGH (ref 70–99)
Glucose-Capillary: 90 mg/dL (ref 70–99)
Glucose-Capillary: 186 mg/dL — ABNORMAL HIGH (ref 70–99)
Glucose-Capillary: 168 mg/dL — ABNORMAL HIGH (ref 70–99)
Glucose-Capillary: 286 mg/dL — ABNORMAL HIGH (ref 70–99)

## 2021-02-23 LAB — CBC
HCT: 32.5 % — ABNORMAL LOW (ref 39.0–52.0)
Hemoglobin: 10.5 g/dL — ABNORMAL LOW (ref 13.0–17.0)
MCH: 28.4 pg (ref 26.0–34.0)
MCHC: 32.3 g/dL (ref 30.0–36.0)
MCV: 87.8 fL (ref 80.0–100.0)
Platelets: 101 10*3/uL — ABNORMAL LOW (ref 150–400)
RBC: 3.7 MIL/uL — ABNORMAL LOW (ref 4.22–5.81)
RDW: 16.3 % — ABNORMAL HIGH (ref 11.5–15.5)
WBC: 4.3 10*3/uL (ref 4.0–10.5)
nRBC: 0.5 % — ABNORMAL HIGH (ref 0.0–0.2)

## 2021-02-23 LAB — BASIC METABOLIC PANEL
Anion gap: 8 (ref 5–15)
BUN: <5 mg/dL — ABNORMAL LOW (ref 8–23)
CO2: 20 mmol/L — ABNORMAL LOW (ref 22–32)
Calcium: 8.7 mg/dL — ABNORMAL LOW (ref 8.9–10.3)
Chloride: 105 mmol/L (ref 98–111)
Creatinine, Ser: 0.69 mg/dL (ref 0.61–1.24)
GFR, Estimated: >60 mL/min (ref >60)
Glucose, Bld: 114 mg/dL — ABNORMAL HIGH (ref 70–99)
Potassium: 3.2 mmol/L — ABNORMAL LOW (ref 3.5–5.1)
Sodium: 133 mmol/L — ABNORMAL LOW (ref 135–145)

## 2021-02-23 LAB — ECHOCARDIOGRAM COMPLETE
Area-P 1/2: 5.27 cm2
Calc EF: 42.4 %
Height: 74 in
MV M vel: 4.27 m/s
MV Peak grad: 72.9 mmHg
P 1/2 time: 1091 msec
Radius: 1.05 cm
S' Lateral: 3.1 cm
Single Plane A2C EF: 37.9 %
Single Plane A4C EF: 43.1 %
Weight: 1869.5 oz

## 2021-02-23 LAB — IRON AND TIBC
Iron: 40 ug/dL — ABNORMAL LOW (ref 45–182)
Saturation Ratios: 27 % (ref 17.9–39.5)
TIBC: 147 ug/dL — ABNORMAL LOW (ref 250–450)
UIBC: 107 ug/dL

## 2021-02-23 LAB — PROCALCITONIN: Procalcitonin: <0.1 ng/mL

## 2021-02-23 LAB — MAGNESIUM: Magnesium: 1.6 mg/dL — ABNORMAL LOW (ref 1.7–2.4)

## 2021-02-23 LAB — RETICULOCYTES
Immature Retic Fract: 8.8 % (ref 2.3–15.9)
RBC.: 3.64 MIL/uL — ABNORMAL LOW (ref 4.22–5.81)
Retic Count, Absolute: 27.3 10*3/uL (ref 19.0–186.0)
Retic Ct Pct: 0.8 % (ref 0.4–3.1)

## 2021-02-23 LAB — FERRITIN: Ferritin: 328 ng/mL (ref 24–336)

## 2021-02-23 LAB — CORTISOL-AM, BLOOD: Cortisol - AM: 2.1 ug/dL — ABNORMAL LOW (ref 6.7–22.6)

## 2021-02-23 LAB — MRSA NEXT GEN BY PCR, NASAL: MRSA by PCR Next Gen: NOT DETECTED

## 2021-02-23 LAB — BILIRUBIN, FRACTIONATED(TOT/DIR/INDIR)
Bilirubin, Direct: 1.2 mg/dL — ABNORMAL HIGH (ref 0.0–0.2)
Indirect Bilirubin: 1.9 mg/dL — ABNORMAL HIGH (ref 0.3–0.9)
Total Bilirubin: 3.1 mg/dL — ABNORMAL HIGH (ref 0.3–1.2)

## 2021-02-23 LAB — VITAMIN B12: Vitamin B-12: 2040 pg/mL — ABNORMAL HIGH (ref 180–914)

## 2021-02-23 LAB — PHOSPHORUS: Phosphorus: 2.5 mg/dL (ref 2.5–4.6)

## 2021-02-23 LAB — CREATININE, SERUM
Creatinine, Ser: 0.71 mg/dL (ref 0.61–1.24)
GFR, Estimated: >60 mL/min (ref >60)

## 2021-02-23 LAB — LACTIC ACID, PLASMA: Lactic Acid, Venous: 1.5 mmol/L (ref 0.5–1.9)

## 2021-02-23 LAB — D-DIMER, QUANTITATIVE: D-Dimer, Quant: 5.33 ug/mL-FEU — ABNORMAL HIGH (ref 0.00–0.50)

## 2021-02-23 LAB — FOLATE: Folate: 13.2 ng/mL (ref >5.9)

## 2021-02-23 LAB — HIV ANTIBODY (ROUTINE TESTING W REFLEX): HIV Screen 4th Generation wRfx: NONREACTIVE

## 2021-02-23 MED ORDER — HYDROCORTISONE NA SUCCINATE PF 100 MG IJ SOLR
100.0000 mg | Freq: Once | INTRAMUSCULAR | Status: AC
  Administered 2021-02-23: 100 mg via INTRAVENOUS
  Filled 2021-02-23: qty 2

## 2021-02-23 MED ORDER — PIPERACILLIN-TAZOBACTAM 3.375 G IVPB
3.3750 g | Freq: Three times a day (TID) | INTRAVENOUS | Status: DC
  Administered 2021-02-23 – 2021-02-24 (×5): 3.375 g via INTRAVENOUS
  Filled 2021-02-23 (×5): qty 50

## 2021-02-23 MED ORDER — SODIUM CHLORIDE 0.9 % IV BOLUS
1000.0000 mL | Freq: Once | INTRAVENOUS | Status: AC
  Administered 2021-02-23: 1000 mL via INTRAVENOUS

## 2021-02-23 MED ORDER — VANCOMYCIN HCL IN DEXTROSE 1-5 GM/200ML-% IV SOLN
1000.0000 mg | INTRAVENOUS | Status: DC
  Administered 2021-02-23: 1000 mg via INTRAVENOUS
  Filled 2021-02-23: qty 200

## 2021-02-23 MED ORDER — BOOST / RESOURCE BREEZE PO LIQD CUSTOM
1.0000 | Freq: Two times a day (BID) | ORAL | Status: DC
  Administered 2021-02-23 – 2021-03-01 (×6): 1 via ORAL

## 2021-02-23 MED ORDER — PNEUMOCOCCAL VAC POLYVALENT 25 MCG/0.5ML IJ INJ
0.5000 mL | INJECTION | INTRAMUSCULAR | Status: DC
  Filled 2021-02-23: qty 0.5

## 2021-02-23 MED ORDER — LACTATED RINGERS IV BOLUS
500.0000 mL | Freq: Once | INTRAVENOUS | Status: AC
  Administered 2021-02-23: 500 mL via INTRAVENOUS

## 2021-02-23 MED ORDER — AMIODARONE HCL IN DEXTROSE 360-4.14 MG/200ML-% IV SOLN
30.0000 mg/h | INTRAVENOUS | Status: DC
  Administered 2021-02-23 – 2021-02-25 (×2): 30 mg/h via INTRAVENOUS
  Filled 2021-02-23 (×2): qty 200

## 2021-02-23 MED ORDER — COSYNTROPIN 0.25 MG IJ SOLR
0.2500 mg | Freq: Once | INTRAMUSCULAR | Status: AC
  Administered 2021-02-24: 0.25 mg via INTRAVENOUS
  Filled 2021-02-23: qty 0.25

## 2021-02-23 MED ORDER — APIXABAN 5 MG PO TABS
5.0000 mg | ORAL_TABLET | Freq: Two times a day (BID) | ORAL | Status: DC
  Administered 2021-02-23 – 2021-02-24 (×3): 5 mg via ORAL
  Filled 2021-02-23 (×3): qty 1

## 2021-02-23 MED ORDER — PROSOURCE PLUS PO LIQD
30.0000 mL | Freq: Two times a day (BID) | ORAL | Status: DC
  Administered 2021-02-23 – 2021-03-14 (×31): 30 mL via ORAL
  Filled 2021-02-23 (×31): qty 30

## 2021-02-23 MED ORDER — SODIUM CHLORIDE 0.9 % IV BOLUS: 1000.0000 mL | INTRAVENOUS | Status: DC | PRN

## 2021-02-23 MED ORDER — AMIODARONE HCL IN DEXTROSE 360-4.14 MG/200ML-% IV SOLN
60.0000 mg/h | INTRAVENOUS | Status: AC
  Administered 2021-02-23 (×2): 60 mg/h via INTRAVENOUS
  Filled 2021-02-23 (×2): qty 200

## 2021-02-23 MED ORDER — PANTOPRAZOLE SODIUM 40 MG PO TBEC
40.0000 mg | DELAYED_RELEASE_TABLET | Freq: Every day | ORAL | Status: DC
  Administered 2021-02-23 – 2021-03-14 (×20): 40 mg via ORAL
  Filled 2021-02-23 (×20): qty 1

## 2021-02-23 MED ORDER — AMIODARONE LOAD VIA INFUSION
150.0000 mg | Freq: Once | INTRAVENOUS | Status: AC
  Administered 2021-02-23: 150 mg via INTRAVENOUS
  Filled 2021-02-23: qty 83.34

## 2021-02-23 MED ORDER — SODIUM CHLORIDE 0.9 % IV BOLUS
1000.0000 mL | Freq: Once | INTRAVENOUS | Status: AC | PRN
  Administered 2021-02-23: 1000 mL via INTRAVENOUS

## 2021-02-23 MED ORDER — MAGNESIUM SULFATE 4 GM/100ML IV SOLN
4.0000 g | Freq: Once | INTRAVENOUS | Status: AC
  Administered 2021-02-23: 4 g via INTRAVENOUS
  Filled 2021-02-23 (×2): qty 100

## 2021-02-23 MED ORDER — COSYNTROPIN 0.25 MG IJ SOLR
0.2500 mg | Freq: Once | INTRAMUSCULAR | Status: DC
Start: 2021-02-24 — End: 2021-02-23

## 2021-02-23 MED ORDER — ENSURE SURGERY PO LIQD
237.0000 mL | ORAL | Status: DC
  Administered 2021-02-24 – 2021-03-14 (×10): 237 mL via ORAL
  Filled 2021-02-23 (×20): qty 237

## 2021-02-23 MED ORDER — VANCOMYCIN HCL IN DEXTROSE 1-5 GM/200ML-% IV SOLN
1000.0000 mg | Freq: Once | INTRAVENOUS | Status: AC
  Administered 2021-02-23: 1000 mg via INTRAVENOUS
  Filled 2021-02-23: qty 200

## 2021-02-23 MED ORDER — SODIUM CHLORIDE 0.9 % IV SOLN: INTRAVENOUS | Status: DC

## 2021-02-23 MED ORDER — CIPROFLOXACIN HCL 0.3 % OP SOLN
1.0000 [drp] | OPHTHALMIC | Status: AC
  Administered 2021-02-23 – 2021-02-25 (×9): 1 [drp] via OPHTHALMIC
  Filled 2021-02-23 (×2): qty 2.5

## 2021-02-23 MED ORDER — POTASSIUM CHLORIDE CRYS ER 20 MEQ PO TBCR
40.0000 meq | EXTENDED_RELEASE_TABLET | ORAL | Status: AC
Start: 2021-02-23 — End: 2021-02-23
  Administered 2021-02-23 (×2): 40 meq via ORAL
  Filled 2021-02-23 (×2): qty 2

## 2021-02-23 NOTE — Progress Notes (Signed)
Pharmacy Antibiotic Note  Roy Ryan is a 70 y.o. male admitted on 02/22/2021 with POC glucose ~ 30 upon arrival.  Pt subsiding entirely on beer.  Pharmacy has been consulted to dose vancomycin and zosyn for sepsis.  Plan: Zosyn 3.375g IV q8h (4 hour infusion). Vancomycin 1gm x 1 then 1gm q24h (AUC 450.8, Scr 0.83, TBW) Daily Scr Follow renal function, cultures and clinical coarse  Height: 6\' 2"  (188 cm) Weight: 53 kg (116 lb 13.5 oz) IBW/kg (Calculated) : 82.2  Temp (24hrs), Avg:96.7 F (35.9 C), Min:95.2 F (35.1 C), Max:98.2 F (36.8 C)  Recent Labs  Lab 02/22/21 1310 02/22/21 1605  WBC 3.0*  --   CREATININE 0.83  --   LATICACIDVEN  --  1.2    Estimated Creatinine Clearance: 64.7 mL/min (by C-G formula based on SCr of 0.83 mg/dL).    No Known Allergies  Antimicrobials this admission: 8/23 vanc >> 8/23 zosyn >>  Dose adjustments this admission:   Microbiology results: 8/23 BCx:  8/22 MRSA PCR:   Thank you for allowing pharmacy to be a part of this patient's care. 9/22 RPh 02/23/2021, 12:20 AM

## 2021-02-23 NOTE — Consult Note (Addendum)
NAMEAidon Ryan MRN:  [id number] DOB:  [birth year] LOS: 1 ADMISSION DATE:  02/22/2021  CONSULTATION DATE:  02/23/2021 REFERRING MD:  Dr. John Giovanni  REASON FOR CONSULTATION:  hypotension   Initial Pulmonary/Critical Care Consultation  Brief History   N/A  History of present illness   This 70 y.o. African American male is seen in consultation at the request of Dr. John Giovanni for recommendations on further evaluation and management of hypotension.  The patient presented to [facility] on 8/22 via EMS with complaints of altered mental status.  He was found to be hypoglycemic (37), hypothermic and hypotensive by EMS.  Dextrose was administered.  At last check, glucose 130. The patient was admitted to the hospitalist service with a clinical diagnosis of severe sepsis.  He was placed on a [employer], and the patient has remained hypotensive despite a total of 3 L NS infusion.  Lactate level normal.  PCCM was consulted for hypotension.  REVIEW OF SYSTEMS Constitutional: Generalized weakness.  No weight loss. No night sweats. No fever. No chills. No fatigue. HEENT: No headaches, dysphagia, sore throat, otalgia, nasal congestion, PND CV:  No chest pain, orthopnea, PND, swelling in lower extremities, palpitations GI:  No abdominal pain, nausea, vomiting, diarrhea, change in bowel pattern, anorexia Resp: No DOE, rest dyspnea, cough, mucus, hemoptysis, wheezing  GU: no dysuria, change in color of urine, no urgency or frequency.  No flank pain. MS:  No joint pain or swelling. No myalgias,  No decreased range of motion.  Psych:  No change in mood or affect. No memory loss. Skin: Dry.  No rash or lesions.   Past Medical/Surgical/Social/Family History   Past Medical History:  Diagnosis Date   Seizures (HCC)     No past surgical history on file.  Social History   Tobacco Use   Smoking status: Never   Smokeless tobacco: Never  Substance Use Topics    Alcohol use: Never    Family History  Problem Relation Age of Onset   CAD Neg Hx    Heart failure Neg Hx    Stroke Neg Hx      Significant Hospital Events   8/22: admitted to the hospitalist service with a clinical diagnosis of severe sepsis. No definiite septic focus.  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   Consults:  PCCM   Procedures:     Significant Diagnostic Tests:     Micro Data:   Results for orders placed or performed during the hospital encounter of 02/22/21  Resp Panel by RT-PCR (Flu A&B, Covid) Nasopharyngeal Swab     Status: None   Collection Time: 02/22/21  5:04 PM   Specimen: Nasopharyngeal Swab; Nasopharyngeal(NP) swabs in vial transport medium  Result Value Ref Range Status   SARS Coronavirus 2 by RT PCR NEGATIVE NEGATIVE Final    Comment: (NOTE) SARS-CoV-2 target nucleic acids are NOT DETECTED.  The SARS-CoV-2 RNA is generally detectable in upper respiratory specimens during the acute phase of infection. The lowest concentration of SARS-CoV-2 viral copies this assay can detect is 138 copies/mL. A negative result does not preclude SARS-Cov-2 infection and should not be used as the sole basis for treatment or other patient management decisions. A negative result may occur with  improper specimen collection/handling, submission of specimen other than nasopharyngeal swab, presence of viral mutation(s) within the areas targeted by this assay, and inadequate number of viral copies(<138 copies/mL). A negative result must be combined with  clinical observations, patient history, and epidemiological information. The expected result is Negative.  Fact Sheet for Patients:  https://www.fda.gov/media/152166/download  Fact Sheet for Healthcare Providers:  SeriousBroker.ithttps://www.fda.gov/media/152162/download  This test is no t yet approved or cleared by the [place] FDA and  has been authorized for detection and/or diagnosis of  SARS-CoV-2 by FDA under an Emergency Use Authorization (EUA). This EUA will remain  in effect (meaning this test can be used) for the duration of the COVID-19 declaration under Section 564(b)(1) of the Act, 21 U.S.C.section 360bbb-3(b)(1), unless the authorization is terminated  or revoked sooner.       Influenza A by PCR NEGATIVE NEGATIVE Final   Influenza B by PCR NEGATIVE NEGATIVE Final    Comment: (NOTE) The Xpert Xpress SARS-CoV-2/FLU/RSV plus assay is intended as an aid in the diagnosis of influenza from Nasopharyngeal swab specimens and should not be used as a sole basis for treatment. Nasal washings and aspirates are unacceptable for Xpert Xpress SARS-CoV-2/FLU/RSV testing.  Fact Sheet for Patients: BloggerCourse.comhttps://www.fda.gov/media/152166/download  Fact Sheet for Healthcare Providers: SeriousBroker.ithttps://www.fda.gov/media/152162/download  This test is not yet approved or cleared by the [place] FDA and has been authorized for detection and/or diagnosis of SARS-CoV-2 by FDA under an Emergency Use Authorization (EUA). This EUA will remain in effect (meaning this test can be used) for the duration of the COVID-19 declaration under Section 564(b)(1) of the Act, 21 U.S.C. section 360bbb-3(b)(1), unless the authorization is terminated or revoked.  Performed at [facility], [address] [place], [place] [address]   MRSA Next Gen by PCR, Nasal     Status: None   Collection Time: 02/22/21 11:27 PM   Specimen: Nasal Mucosa; Nasal Swab  Result Value Ref Range Status   MRSA by PCR Next Gen NOT DETECTED NOT DETECTED Final    Comment: (NOTE) The GeneXpert MRSA Assay (FDA approved for NASAL specimens only), is one component of a comprehensive MRSA colonization surveillance program. It is not intended to diagnose MRSA infection nor to guide or monitor treatment for MRSA infections. Test performance is not FDA approved in patients less than 68 years old. Performed at  [facility], [address] [place], [place] [address]       Antimicrobials:  Zosyn/vancomycin (8/22>>)   Interim history/subjective:  N/A   Objective   BP (!) 82/55   Pulse (!) 57   Temp 97.6 F (36.4 C) (Oral) Comment: Bair hugger removed per CCM team  Resp 17   Ht 6\' 2"  (1.88 m)   Wt 53 kg   SpO2 99%   BMI 15.00 kg/m     Filed Weights   02/22/21 2308  Weight: 53 kg    Intake/Output Summary (Last 24 hours) at 02/23/2021 0236 Last data filed at 02/23/2021 0155 Gross per 24 hour  Intake --  Output 100 ml  Net -100 ml        Examination: GENERAL:  alert, oriented to time, person and place, pleasant. Cachectic.  No acute distress. HEAD: normocephalic, atraumatic EYE: PERRLA, EOM intact, no scleral icterus, no pallor. NOSE: nares are patent. No polyps. No exudate.  No sinus tenderness. THROAT/ORAL CAVITY: Normal dentition. No oral thrush. No exudate. Mucous membranes are moist. No tonsillar enlargement.  NECK: supple, no thyromegaly, no JVD, no lymphadenopathy. Trachea midline. CHEST/LUNG: symmetric in development and expansion. Good air entry. No crackles. No wheezes. HEART: Irregularly, irregular with 2/6 systolic murmur in the mitral area. ABDOMEN: soft, nontender, nondistended. Normoactive bowel sounds. No rebound.  No guarding. No hepatosplenomegaly. EXTREMITIES: Edema: none. No cyanosis. No clubbing. 2+ DP pulses LYMPHATIC: no cervical/axillary/inguinal lymph nodes appreciated MUSCULOSKELETAL: No point tenderness.  Bulk atrophy.  Joints: normal inspection.  SKIN:  Dry skin with tenting.  No rash or lesion. NEUROLOGIC: Doll's eyes intact. Corneal reflex intact. Spontaneous respirations intact. Cranial nerves II-XII are grossly symmetric and physiologic. Babinski absent. No sensory deficit. Motor: 5/5 @ RUE, 5/5 @ LUE, 5/5 @ RLL,  5/5 @ LLL.  DTR: 2+ @ R biceps, 2+ @ L biceps, 2+ @ R patellar,  2+ @ L patellar. No cerebellar signs. Gait was  not assessed.   Resolved Hospital Problem list      Assessment & Plan:   ASSESSMENT/PLAN:  ASSESSMENT (included in the Hospital Problem List)  Principal Problem:   Hypotension Active Problems:   Atrial fibrillation with rapid ventricular response (HCC)   Failure to thrive in adult   Mild protein-calorie malnutrition (HCC)   Hypoglycemia   HFrEF (heart failure with reduced ejection fraction) (HCC)   Hypomagnesemia   Chronic alcoholic liver disease (HCC)   Hypoalbuminemia   Thrombocytopenia (HCC)   By systems: PULMONARY: No acute issues Currently, SpO2 99-100% on room air. Supplemental oxygen as needed to maintain SpO2 93+%.   CARDIOVASCULAR Hypotension Atrial fibrillation with rapid ventricular response, new onset (but has a history of atrial flutter with 2:1 conduction) Mitral regurgitation (previously identified on 2D echo on 01/31/2021) HFrE, LVEF 50-55% Continue IV fluids Troponin WNL. CHA2DS2-VASc Score 2.  Recommend anticoagulation for stroke prophylaxis.  Cardiology consultation is advised.   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   PLAN/RECOMMENDATIONS  IV fluids (maintenance): NS @ 100 mL/hr.  NS bolus for MAP < 65. Meds: per primary team Replete magnesium. Labs: see above NUTRITION: per primary team    My assessment, plan of care, findings, medications, side effects, etc. were  discussed with: nurse.   Best practice:  Diet: per primary team Pain/Anxiety/Delirium protocol (if indicated): N/A VAP protocol (if indicated): N/A DVT prophylaxis: on Lovenox GI prophylaxis: Protonix Glucose control: N/A Mobility/Activity: per primary team   Code Status: Full Code Family Communication:   no family at bedside Disposition: no need to transfer to ICU at this time   Labs   CBC: Recent Labs  Lab 02/22/21 1310  WBC 3.0*  NEUTROABS 1.3*  HGB 10.3*  HCT 31.9*  MCV 88.9  PLT 92*    Basic Metabolic Panel: Recent Labs  Lab 02/22/21 1310 02/23/21 0047  NA 133*  --   K 4.5  --   CL 97*  --   CO2 22  --   GLUCOSE 250*  --   BUN 5*  --   CREATININE 0.83 0.71  CALCIUM 8.5*  --   MG  --  1.6*  PHOS  --  2.5   GFR: Estimated Creatinine Clearance: 67.2 mL/min (by C-G formula based on SCr of 0.71 mg/dL). Recent Labs  Lab 02/22/21 1310 02/22/21 1605 02/23/21 0047  WBC 3.0*  --   --   LATICACIDVEN  --  1.2 1.5  Liver Function Tests: Recent Labs  Lab 02/22/21 1310  AST 31  ALT 16  ALKPHOS 75  BILITOT 3.5*  PROT 6.5  ALBUMIN 3.0*   No results for input(s): LIPASE, AMYLASE in the last 168 hours. Recent Labs  Lab 02/22/21 1310  AMMONIA 22    ABG No results found for: PHART, PCO2ART, PO2ART, HCO3, TCO2, ACIDBASEDEF, O2SAT   Coagulation Profile: No results for input(s): INR, PROTIME in the last 168 hours.  Cardiac Enzymes: No results for input(s): CKTOTAL, CKMB, CKMBINDEX, TROPONINI in the last 168 hours.  HbA1C: Hgb A1c MFr Bld  Date/Time Value Ref Range Status  01/31/2020 09:30 AM 5.4 4.8 - 5.6 % Final    Comment:    (NOTE) Pre diabetes:          5.7%-6.4%  Diabetes:              >6.4%  Glycemic control for   <7.0% adults with diabetes     CBG: Recent Labs  Lab 02/22/21 1254 02/22/21 2019 02/22/21 2316  GLUCAP 135* 127* 139*     Review of Systems:   See above   Past Medical History   Past Medical History:   Diagnosis Date   Seizures (HCC)       Surgical History   No past surgical history on file.    Social History   Social History   Socioeconomic History   Marital status: Single    Spouse name: Not on file   Number of children: Not on file   Years of education: Not on file   Highest education level: Not on file  Occupational History   Not on file  Tobacco Use   Smoking status: Never   Smokeless tobacco: Never  Substance and Sexual Activity   Alcohol use: Never   Drug use: Never   Sexual activity: Not on file  Other Topics Concern   Not on file  Social History Narrative   Not on file   Social Determinants of Health   Financial Resource Strain: Not on file  Food Insecurity: Not on file  Transportation Needs: Not on file  Physical Activity: Not on file  Stress: Not on file  Social Connections: Not on file      Family History    Family History  Problem Relation Age of Onset   CAD Neg Hx    Heart failure Neg Hx    Stroke Neg Hx    family history is negative for CAD, Heart failure, and Stroke.    Allergies No Known Allergies    Current Medications  Current Facility-Administered Medications:    0.9 %  sodium chloride infusion, , Intravenous, Continuous, Marcelle Smiling, MD   acetaminophen (TYLENOL) tablet 650 mg, 650 mg, Oral, Q6H PRN **OR** acetaminophen (TYLENOL) suppository 650 mg, 650 mg, Rectal, Q6H PRN, John Giovanni, MD   Chlorhexidine Gluconate Cloth 2 % PADS 6 each, 6 each, Topical, Daily, John Giovanni, MD   enoxaparin (LOVENOX) injection 40 mg, 40 mg, Subcutaneous, Q24H, Rathore, Vasundhra, MD   folic acid (FOLVITE) tablet 1 mg, 1 mg, Oral, Daily, Rathore, Vasundhra, MD   LORazepam (ATIVAN) tablet 1-4 mg, 1-4 mg, Oral, Q1H PRN **OR** LORazepam (ATIVAN) injection 1-4 mg, 1-4 mg, Intravenous, Q1H PRN, John Giovanni, MD   multivitamin with minerals tablet 1 tablet, 1 tablet, Oral, Daily, Rathore, Vasundhra, MD    piperacillin-tazobactam (ZOSYN) IVPB 3.375 g, 3.375 g, Intravenous, Q8H, Maurice March, RPH, Last Rate: 8.75 mL/hr at 02/23/21 0052, 3.375 g  at 02/23/21 0052   sodium chloride 0.9 % bolus 1,000 mL, 1,000 mL, Intravenous, Once PRN, John Giovanni, MD   thiamine tablet 100 mg, 100 mg, Oral, Daily **OR** thiamine (B-1) injection 100 mg, 100 mg, Intravenous, Daily, John Giovanni, MD   Melene Muller ON 02/24/2021] vancomycin (VANCOCIN) IVPB 1000 mg/200 mL premix, 1,000 mg, Intravenous, Q24H, Maurice March, [facility]  Home Medications  Prior to Admission medications   Not on File      Critical care time: 45 minutes.  The treatment and management of the patient's condition was required based on the threat of imminent deterioration. This time reflects time spent by the physician evaluating, providing care and managing the critically ill patient's care. The time was spent at the immediate bedside (or on the same floor/unit and dedicated to this patient's care). Time involved in separately billable procedures is NOT included int he critical care time indicated above. Family meeting and update time may be included above if and only if the patient is unable/incompetent to participate in clinical interview and/or decision making, and the discussion was necessary to determining treatment decisions.    Marcelle Smiling, MD Board Certified by the ABIM, Pulmonary Diseases & Critical Care Medicine

## 2021-02-23 NOTE — Consult Note (Addendum)
Cardiology Consultation:   Patient ID: Roy Ryan MRN: [id number]; DOB: 1951-03-14  Admit date: 02/22/2021 Date of Consult: 02/23/2021  PCP:  Patient, No Pcp Per (Inactive)   [facility] Providers Cardiologist:  Nanetta Batty, MD   {  Patient Profile:   Roy Ryan is a 70 y.o. male with a hx of mitral regurgitation with prolapse, pulmonary hypertension, homelessness, paroxysmal atrial flutter and alcohol abuse who is being seen 02/23/2021 for the evaluation of atrial fibrillation at the request of Dr. Sharl Ma.  Seen by cardiology service July 2021 for evaluation of dyspnea on exertion and chest pain in setting of possible pneumonia.  Echocardiogram showed EF of 50 to 55%, RVSP 53.7 mmHg, mitral regurgitation with prolapse.  He was also noted to have atrial flutter.  Avoided anticoagulation given CHA2DS2-VASc score of 1 with history of alcohol abuse.  He partially completed Lexiscan Myoview and declined.  Wanted to left AMA and discharge. Never followed up in outpatient setting.  History of Present Illness:   Roy Ryan brought by EMS for altered mental status from hotel.  He was found hypoglycemic with CBG of 37 by EMS.  Given dextrose with improved sugar level.  He was also found in A. fib RVR with hypotension.  He was hypothermic and placed on [name]  Remained hypotensive despite 3 L of normal saline.  Admitted for severe sepsis secondary to unknown reason.  He was found to have low cortisol at 2.1.  Cosyntropin test pending for evaluation of adrenal insufficiency.  No infectious source has been identified.  He was found to have C5-C7 fracture.  Magnesium 1.6>> supplemented.  Patient is poor historian with poor hygiene.  Urine drug screen clear.  Troponin negative.  Normal thyroid function test.  Cardiology is asked for evaluation of atrial fibrillation.  Patient reports using walker for ambulation.  Denies palpitation, chest pain or shortness of breath.  No syncope.   Past Medical  History:  Diagnosis Date   Alcohol abuse    Atrial flutter (HCC)    Mitral regurgitation    Seizures (HCC)     Inpatient Medications: Scheduled Meds:  Chlorhexidine Gluconate Cloth  6 each Topical Daily   [START ON 02/24/2021] cosyntropin  0.25 mg Intravenous Once   folic acid  1 mg Oral Daily   multivitamin with minerals  1 tablet Oral Daily   thiamine  100 mg Oral Daily   Or   thiamine  100 mg Intravenous Daily   Continuous Infusions:  sodium chloride 100 mL/hr at 02/23/21 7588   lactated ringers     piperacillin-tazobactam (ZOSYN)  IV 3.375 g (02/23/21 0845)   sodium chloride     [START ON 02/24/2021] vancomycin     PRN Meds: acetaminophen **OR** acetaminophen, LORazepam **OR** LORazepam, sodium chloride  Allergies:   No Known Allergies  Social History:   Social History   Socioeconomic History   Marital status: Single    Spouse name: Not on file   Number of children: Not on file   Years of education: Not on file   Highest education level: Not on file  Occupational History   Not on file  Tobacco Use   Smoking status: Never   Smokeless tobacco: Never  Substance and Sexual Activity   Alcohol use: Never   Drug use: Never   Sexual activity: Not on file  Other Topics Concern   Not on file  Social History Narrative   Not on file   Social Determinants of Health  Financial Resource Strain: Not on file  Food Insecurity: Not on file  Transportation Needs: Not on file  Physical Activity: Not on file  Stress: Not on file  Social Connections: Not on file  Intimate Partner Violence: Not on file    Family History:   Family History  Problem Relation Age of Onset   CAD Neg Hx    Heart failure Neg Hx    Stroke Neg Hx     ROS:  Please see the history of present illness.  All other ROS reviewed and negative.     Physical Exam/Data:   Vitals:   02/23/21 0630 02/23/21 0645 02/23/21 0700 02/23/21 0800  BP: 94/65 95/65 101/71 (!) 90/58  Pulse:      Resp: 15 13  15 14   Temp:   (!) 97.4 F (36.3 C)   TempSrc:   Oral   SpO2: 98% 100%  100%  Weight:      Height:        Intake/Output Summary (Last 24 hours) at 02/23/2021 0948 Last data filed at 02/23/2021 02/25/2021 Gross per 24 hour  Intake 4782.42 ml  Output 100 ml  Net 4682.42 ml   Last 3 Weights 02/22/2021 02/01/2020 01/31/2020  Weight (lbs) 116 lb 13.5 oz 135 lb 12.8 oz 165 lb  Weight (kg) 53 kg 61.598 kg 74.844 kg     Body mass index is 15 kg/m.  General: Frail ill-appearing male in no acute distress HEENT: normal Lymph: no adenopathy Neck: no JVD Endocrine:  No thryomegaly Vascular: No carotid bruits; FA pulses 2+ bilaterally without bruits  Cardiac:  normal S1, S2; irregularly irregular, systolic murmur Lungs:  clear to auscultation bilaterally, no wheezing, rhonchi or rales  Abd: soft, nontender, no hepatomegaly  Ext: no edema Musculoskeletal:  No deformities, BUE and BLE strength normal and equal Skin: warm and dry  Neuro:  CNs 2-12 intact, no focal abnormalities noted Psych:  Normal affect   EKG:  The EKG was personally reviewed and demonstrates: No EKG this admission. Telemetry:  Telemetry was personally reviewed and demonstrates: Atrial fibrillation at rate of 110  Relevant CV Studies:  Echo 01/31/20 1. Left ventricular ejection fraction, by estimation, is 50 to 55%. The  left ventricle has low normal function. The left ventricle has no regional  wall motion abnormalities. Left ventricular diastolic parameters are  indeterminate.   2. Right ventricular systolic function is moderately reduced. The right  ventricular size is normal. There is moderately elevated pulmonary artery  systolic pressure. The estimated right ventricular systolic pressure is  53.7 mmHg.   3. Left atrial size was severely dilated.   4. Right atrial size was moderately dilated.   5. The mitral valve is myxomatous. There is moderate holosystolic  prolapse of the medial scallop of the posterior leaflet of  the mitral  valve. The anterior leaflet is mildly elongated. Normal mobility of the  mitral valve leaflets. Moderate mitral valve  regurgitation, with eccentric anteriorly directed jet. No evidence of  mitral valve stenosis.   6. The aortic valve is tricuspid. Aortic valve regurgitation is not  visualized. Mild aortic valve sclerosis is present, with no evidence of  aortic valve stenosis.   7. The inferior vena cava is dilated in size with >50% respiratory  variability, suggesting right atrial pressure of 8 mmHg.   8. Consider TEE for further evaluation of mital valve.   Laboratory Data:  High Sensitivity Troponin:   Recent Labs  Lab 02/22/21 1310 02/22/21 1605  TROPONINIHS  6 7     Chemistry Recent Labs  Lab 02/22/21 1310 02/23/21 0047  NA 133*  --   K 4.5  --   CL 97*  --   CO2 22  --   GLUCOSE 250*  --   BUN 5*  --   CREATININE 0.83 0.71  CALCIUM 8.5*  --   GFRNONAA >60 >60  ANIONGAP 14  --     Recent Labs  Lab 02/22/21 1310  PROT 6.5  ALBUMIN 3.0*  AST 31  ALT 16  ALKPHOS 75  BILITOT 3.5*   Hematology Recent Labs  Lab 02/22/21 1310  WBC 3.0*  RBC 3.59*  HGB 10.3*  HCT 31.9*  MCV 88.9  MCH 28.7  MCHC 32.3  RDW 16.1*  PLT 92*    Radiology/Studies:  CT HEAD WO CONTRAST (5MM)  Result Date: 02/22/2021 CLINICAL DATA:  Delirium EXAM: CT HEAD WITHOUT CONTRAST TECHNIQUE: Contiguous axial images were obtained from the base of the skull through the vertex without intravenous contrast. COMPARISON:  01/31/2020 FINDINGS: Brain: No evidence of acute infarction, hemorrhage, hydrocephalus, extra-axial collection or mass lesion/mass effect. Mild low-density changes within the periventricular and subcortical white matter compatible with chronic microvascular ischemic change. Mild diffuse cerebral volume loss. Vascular: Atherosclerotic calcifications involving the large vessels of the skull base. No unexpected hyperdense vessel. Skull: Normal. Negative for fracture or  focal lesion. Sinuses/Orbits: Chronic complete right maxillary sinus opacification with associated bony thickening of the maxillary sinus walls. Mucosal thickening within the bilateral ethmoid air cells and bilateral frontal sinuses. Air-fluid level in the left frontal sinus. Other: None. IMPRESSION: 1. No acute intracranial findings. 2. Mild chronic microvascular ischemic change and cerebral volume loss. 3. Chronic right maxillary sinus disease. Air-fluid level in the left frontal sinus may represent acute on chronic sinusitis. Electronically Signed   By: Duanne GuessNicholas  Plundo D.O.   On: 02/22/2021 16:02   DG Chest Portable 1 View  Result Date: 02/22/2021 CLINICAL DATA:  Delirium. EXAM: PORTABLE CHEST 1 VIEW COMPARISON:  One-view scratched at two-view chest x-ray and CTA chest 01/31/2020 FINDINGS: Heart is upper limits of normal. 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 No edema or effusion is present. No focal airspace disease present. Degenerative changes are again noted in the thoracic spine. Progression fractures are again noted at C5, C6, and C7. IMPRESSION: 1. No acute cardiopulmonary disease or significant interval change. 2. Progression fractures at C5, C6, and C7. Electronically Signed   By: Marin Robertshristopher  Mattern M.D.   On: 02/22/2021 14:43     Assessment and Plan:   Atrial fibrillation with rapid ventricular rate -Patient was found to be in atrial fibrillation in setting up hypoglycemia, hypotension, possible sepsis of unknown etiology (resolved) and adrenal insufficiency. -Blood pressure is improving but remained low  -Heart rate remains in 110s but aysmptomatic  CHA2DS2-VASc Score = 1  This indicates a 0.6% annual risk of stroke. The patient's score is based upon: CHF History: No HTN History: No Diabetes History: No Stroke History: No Vascular Disease History: No Age Score: 1 Gender Score: 0    -We will plan for TEE cardioversion.  Start Eliquis 5 mg twice daily.  Consult case manager for 30-days  supply.  High risk of bleeding if ongoing alcohol abuse on anticoagulation.  Anticoagulation could be stoppedin 30 days?. Add PPI for Gi protection.    2.  Mitral regurgitation with prolapse -Pending echocardiogram this admission -TEE will help to evaluate mitral valve better  3.  Adrenal insufficiency -Undergoing work-up per  primary team  4.  Alcohol abuse -On CIWA protocol  For questions or updates, please contact [facility] Please consult [email or web address] for contact info under    Lorelei Pont, PA  02/23/2021 9:48 AM

## 2021-02-23 NOTE — Progress Notes (Signed)
[facility]  PROGRESS NOTE  Roy Ryan BTD:[id number] DOB: 1951/05/26 DOA: 02/22/2021 PCP: Patient, No Pcp Per (Inactive)   Brief HPI:    70 year old male with medical history of alcohol abuse, alcohol withdrawal seizures, atypical flutter, pulmonary hypertension presented to ED via EMS for evaluation of altered mental status.  Patient currently residing at hotel and was found to be hypoglycemic with CBG 37.  EMS reported poor living conditions, feces and urine bed, rotting food, multiple alcohol bottles.  Blood pressure was soft was given IV fluid bolus.  In the ED he was hypothermic with rectal temperature 95.2.  He was placed on [name]  Initially placed on empiric antibiotics for severe sepsis however no clear source of infection identified.  Due to persistent hypotension PCCM was consulted, found to have very low cortisol 2.1, cosyntropin stim test ordered for adrenal insufficiency.  He received IV fluid boluses, noted on pressor support.  Patient also found to be in A. fib with RVR.  Cardiology consulted.  Chest x-ray also showed progression fractures at C5-6-7    Subjective   Eating breakfast in bed, denies shortness of breath.  Blood pressure is stable.    Assessment/Plan:    Altered mental status -Resolved -Mentally back to baseline -EEG showed moderate diffuse encephalopathy, no seizures or epileptiform discharges seen -CT head was unremarkable, showed chronic right maxillary sinus disease, air-fluid in the left frontal sinus may represent acute or chronic sinusitis -B12 2040, ammonia 22 -UDS negative  Hypotension/adrenal insufficiency -Likely due to adrenal insufficiency; cortisol level was 2.1 -Cortisone stimulation test has been ordered for tomorrow morning -Patient received 1 dose of Solu-Cortef 100 mg IV x1.  We will repeat the dose of Solu-Cortef 100 mg x 1. -Also found to be in A. fib with RVR -No infectious source identified; chest x-ray showed no acute  cardiopulmonary disease, lactic acid 1.5, procalcitonin less than 0.10, WBC 4.3, UA was clear -Was started on empiric antibiotics, PCCM was also consulted -Did not require pressor support  Thoracic compression fracture  -Initial chest x-ray chest x-ray showed progression fractures at C5-6-7 -MRI cervical spine was ordered -However radiologist called back and told that this was an error -Corrected report states that patient has compression fractures at T5-T6 and T7 in thoracic spine which are stable from CT scan from 01/01/2020.  No evidence of progression  New onset atrial fibrillation with RVR -Started on apixaban 5 mg twice a day; CHA2DS2VASc score of 1 -Cardiology planning TEE/DCCV  Alcohol abuse -Started on CIWA protocol -Continue thiamine  Subclinical hypothyroidism -TSH 7.133, T4 1.21 -Will need repeat outpatient thyroid function test in 6 to 8 weeks  Hypomagnesemia -Magnesium replaced, will follow magnesium level in a.m.  Hyperbilirubinemia -Total bilirubin 3.5, improved to 3.1 this morning -Intact bilirubin is elevated at 1.9, direct bilirubin 1.2 -Unclear etiology, likely shock liver from hypotension -Follow LFTs in a.m.  Hypokalemia -Replace potassium and check potassium level in a.m.  Sinusitis -Seen on CT head -Patient is currently on antibiotics  Alcohol abuse -No signs of alcohol withdrawal -Continue CIWA protocol  Homeless issues -Social work consulted  Pancytopenia -Mild; likely from chronic alcohol abuse   Scheduled medications:    Chlorhexidine Gluconate Cloth  6 each Topical Daily   [START ON 02/24/2021] cosyntropin  0.25 mg Intravenous Once   folic acid  1 mg Oral Daily   multivitamin with minerals  1 tablet Oral Daily   thiamine  100 mg Oral Daily   Or   thiamine  100  mg Intravenous Daily         Data Reviewed:   CBG:  Recent Labs  Lab 02/22/21 1254 02/22/21 2019 02/22/21 2316 02/23/21 0305 02/23/21 0746  GLUCAP 135* 127*  139* 118* 90    SpO2: 100 %    Vitals:   02/23/21 0630 02/23/21 0645 02/23/21 0700 02/23/21 0800  BP: 94/65 95/65 101/71 (!) 90/58  Pulse:      Resp: 15 13 15 14   Temp:   (!) 97.4 F (36.3 C)   TempSrc:   Oral   SpO2: 98% 100%  100%  Weight:      Height:         Intake/Output Summary (Last 24 hours) at 02/23/2021 0914 Last data filed at 02/23/2021 02/25/2021 Gross per 24 hour  Intake 4782.42 ml  Output 100 ml  Net 4682.42 ml    08/21 1901 - 08/23 0700 In: 4759.2 [I.V.:1071.2] Out: 100 [Urine:100]  Filed Weights   02/22/21 2308  Weight: 53 kg    CBC:  Recent Labs  Lab 02/22/21 1310  WBC 3.0*  HGB 10.3*  HCT 31.9*  PLT 92*  MCV 88.9  MCH 28.7  MCHC 32.3  RDW 16.1*  LYMPHSABS 1.2  MONOABS 0.3  EOSABS 0.1  BASOSABS 0.0    Complete metabolic panel:  Recent Labs  Lab 02/22/21 1310 02/22/21 1605 02/23/21 0047  NA 133*  --   --   K 4.5  --   --   CL 97*  --   --   CO2 22  --   --   GLUCOSE 250*  --   --   BUN 5*  --   --   CREATININE 0.83  --  0.71  CALCIUM 8.5*  --   --   AST 31  --   --   ALT 16  --   --   ALKPHOS 75  --   --   BILITOT 3.5*  --   --   ALBUMIN 3.0*  --   --   MG  --   --  1.6*  PROCALCITON  --   --  <0.10  LATICACIDVEN  --  1.2 1.5  TSH 7.133*  --   --   AMMONIA 22  --   --     No results for input(s): LIPASE, AMYLASE in the last 168 hours.  Recent Labs  Lab 02/22/21 1704 02/23/21 0047  PROCALCITON  --  <0.10  SARSCOV2NAA NEGATIVE  --     ------------------------------------------------------------------------------------------------------------------ No results for input(s): CHOL, HDL, LDLCALC, TRIG, CHOLHDL, LDLDIRECT in the last 72 hours.  Lab Results  Component Value Date   HGBA1C 5.4 01/31/2020   ------------------------------------------------------------------------------------------------------------------ Recent Labs    02/22/21 1310  TSH 7.133*    ------------------------------------------------------------------------------------------------------------------ No results for input(s): VITAMINB12, FOLATE, FERRITIN, TIBC, IRON, RETICCTPCT in the last 72 hours.  Coagulation profile No results for input(s): INR, PROTIME in the last 168 hours. No results for input(s): DDIMER in the last 72 hours.  Cardiac Enzymes No results for input(s): CKTOTAL, CKMB, CKMBINDEX, TROPONINI in the last 168 hours.  ------------------------------------------------------------------------------------------------------------------    Component Value Date/Time   BNP 195.5 (H) 01/31/2020 1240     Antibiotics: Anti-infectives (From admission, onward)    Start     Dose/Rate Route Frequency Ordered Stop   02/24/21 0000  vancomycin (VANCOCIN) IVPB 1000 mg/200 mL premix        1,000 mg 200 mL/hr over 60 Minutes Intravenous Every 24 hours 02/23/21 0022  02/23/21 0115  piperacillin-tazobactam (ZOSYN) IVPB 3.375 g        3.375 g 12.5 mL/hr over 240 Minutes Intravenous Every 8 hours 02/23/21 0015     02/23/21 0115  vancomycin (VANCOCIN) IVPB 1000 mg/200 mL premix        1,000 mg 200 mL/hr over 60 Minutes Intravenous  Once 02/23/21 0015 02/23/21 0151        Radiology Reports  CT HEAD WO CONTRAST (5MM)  Result Date: 02/22/2021 CLINICAL DATA:  Delirium EXAM: CT HEAD WITHOUT CONTRAST TECHNIQUE: Contiguous axial images were obtained from the base of the skull through the vertex without intravenous contrast. COMPARISON:  01/31/2020 FINDINGS: Brain: No evidence of acute infarction, hemorrhage, hydrocephalus, extra-axial collection or mass lesion/mass effect. Mild low-density changes within the periventricular and subcortical white matter compatible with chronic microvascular ischemic change. Mild diffuse cerebral volume loss. Vascular: Atherosclerotic calcifications involving the large vessels of the skull base. No unexpected hyperdense vessel. Skull:  Normal. Negative for fracture or focal lesion. Sinuses/Orbits: Chronic complete right maxillary sinus opacification with associated bony thickening of the maxillary sinus walls. Mucosal thickening within the bilateral ethmoid air cells and bilateral frontal sinuses. Air-fluid level in the left frontal sinus. Other: None. IMPRESSION: 1. No acute intracranial findings. 2. Mild chronic microvascular ischemic change and cerebral volume loss. 3. Chronic right maxillary sinus disease. Air-fluid level in the left frontal sinus may represent acute on chronic sinusitis. Electronically Signed   By: Duanne GuessNicholas  Plundo D.O.   On: 02/22/2021 16:02   DG Chest Portable 1 View  Result Date: 02/22/2021 CLINICAL DATA:  Delirium. EXAM: PORTABLE CHEST 1 VIEW COMPARISON:  One-view scratched at two-view chest x-ray and CTA chest 01/31/2020 FINDINGS: Heart is upper limits of normal. 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 No edema or effusion is present. No focal airspace disease present. Degenerative changes are again noted in the thoracic spine. Progression fractures are again noted at C5, C6, and C7. IMPRESSION: 1. No acute cardiopulmonary disease or significant interval change. 2. Progression fractures at C5, C6, and C7. Electronically Signed   By: Marin Robertshristopher  Mattern M.D.   On: 02/22/2021 14:43      DVT prophylaxis: Apixaban  Code Status: Full code  Family Communication: No family at bedside   Consultants: PCCM Cardiology  Procedures:     Objective    Physical Examination:   General: Appears in no acute distress Cardiovascular: S1 S2, irregular, no murmur auscultated Respiratory: Clear to auscultation bilaterally Abdomen: Abdomen is soft, nontender, no organomegaly Extremities: No edema in the lower extremities Neurologic: Alert oriented x3, no focal deficit noted   Status is: Inpatient  Dispo: The patient is from: Homeless              Anticipated d/c is to: To be decided              Anticipated d/c  date is: 02/28/2021              Patient currently not stable for discharge  Barrier to discharge-ongoing evaluation management for persistent hypotension, hypoglycemia  COVID-19 Labs  No results for input(s): DDIMER, FERRITIN, LDH, CRP in the last 72 hours.  Lab Results  Component Value Date   SARSCOV2NAA NEGATIVE 02/22/2021   SARSCOV2NAA NEGATIVE 01/31/2020    Microbiology  Recent Results (from the past 240 hour(s))  Resp Panel by RT-PCR (Flu A&B, Covid) Nasopharyngeal Swab     Status: None   Collection Time: 02/22/21  5:04 PM   Specimen: Nasopharyngeal Swab; Nasopharyngeal(NP) swabs  in vial transport medium  Result Value Ref Range Status   SARS Coronavirus 2 by RT PCR NEGATIVE NEGATIVE Final    Comment: (NOTE) SARS-CoV-2 target nucleic acids are NOT DETECTED.  The SARS-CoV-2 RNA is generally detectable in upper respiratory specimens during the acute phase of infection. The lowest concentration of SARS-CoV-2 viral copies this assay can detect is 138 copies/mL. A negative result does not preclude SARS-Cov-2 infection and should not be used as the sole basis for treatment or other patient management decisions. A negative result may occur with  improper specimen collection/handling, submission of specimen other than nasopharyngeal swab, presence of viral mutation(s) within the areas targeted by this assay, and inadequate number of viral copies(<138 copies/mL). A negative result must be combined with clinical observations, patient history, and epidemiological information. The expected result is Negative.  Fact Sheet for Patients:  BloggerCourse.com  Fact Sheet for Healthcare Providers:  SeriousBroker.it  This test is no t yet approved or cleared by the [place] FDA and  has been authorized for detection and/or diagnosis of SARS-CoV-2 by FDA under an Emergency Use Authorization (EUA). This EUA will remain  in effect  (meaning this test can be used) for the duration of the COVID-19 declaration under Section 564(b)(1) of the Act, 21 U.S.C.section 360bbb-3(b)(1), unless the authorization is terminated  or revoked sooner.       Influenza A by PCR NEGATIVE NEGATIVE Final   Influenza B by PCR NEGATIVE NEGATIVE Final    Comment: (NOTE) The Xpert Xpress SARS-CoV-2/FLU/RSV plus assay is intended as an aid in the diagnosis of influenza from Nasopharyngeal swab specimens and should not be used as a sole basis for treatment. Nasal washings and aspirates are unacceptable for Xpert Xpress SARS-CoV-2/FLU/RSV testing.  Fact Sheet for Patients: BloggerCourse.com  Fact Sheet for Healthcare Providers: SeriousBroker.it  This test is not yet approved or cleared by the [place] FDA and has been authorized for detection and/or diagnosis of SARS-CoV-2 by FDA under an Emergency Use Authorization (EUA). This EUA will remain in effect (meaning this test can be used) for the duration of the COVID-19 declaration under Section 564(b)(1) of the Act, 21 U.S.C. section 360bbb-3(b)(1), unless the authorization is terminated or revoked.  Performed at [facility], [address] [place], [place] [address]   MRSA Next Gen by PCR, Nasal     Status: None   Collection Time: 02/22/21 11:27 PM   Specimen: Nasal Mucosa; Nasal Swab  Result Value Ref Range Status   MRSA by PCR Next Gen NOT DETECTED NOT DETECTED Final    Comment: (NOTE) The GeneXpert MRSA Assay (FDA approved for NASAL specimens only), is one component of a comprehensive MRSA colonization surveillance program. It is not intended to diagnose MRSA infection nor to guide or monitor treatment for MRSA infections. Test performance is not FDA approved in patients less than 60 years old. Performed at [facility], [address], [place], [place] [address]               Meredeth Ide   [facility] If 7PM-7AM, please contact night-coverage at [email or web address], Office  [phone]   02/23/2021, 9:14 AM  LOS: 1 day

## 2021-02-23 NOTE — Progress Notes (Signed)
Initial Nutrition Assessment  DOCUMENTATION CODES:   Severe malnutrition in context of social or environmental circumstances, Underweight  INTERVENTION:  - will order Boost Breeze BID, each supplement provides 250 kcal and 9 grams of protein. - will order Ensure Surgery once/day, each supplement provides 330 kcal and 18 grams of protein. - will order 30 ml Prosource Plus BID, each supplement provides 100 kcal and 15 grams protein.    NUTRITION DIAGNOSIS:   Severe Malnutrition related to social / environmental circumstances as evidenced by severe fat depletion, severe muscle depletion.  GOAL:   Patient will meet greater than or equal to 90% of their needs  MONITOR:   PO intake, Supplement acceptance, Labs, Weight trends  REASON FOR ASSESSMENT:   Malnutrition Screening Tool  ASSESSMENT:   70 y.o. male with medical history of alcohol abuse, alcohol withdrawal seizures, atypical flutter, and pulmonary HTN. He presented to the ED d/t AMS. He is residing at a motel. EMS reported very poor living conditions to include urine and feces covering the bed, rotting food, and multiple alcohol bottles. He reported drinking 3-4 beers/day and that he had no food or water in his motel for an unknown amount of time.  No intakes documented since admission. Patient laying in bed with no family or visitors present. Patient sleeping but did awake to name call x3 and shoulder rub x2. Patient drowsy at times throughout visit.   He recalls having pancakes and sausage for breakfast. Denies abdominal pain or nausea currently.   He recounts that he was living in a motel and that for 2-3 days PTA he was extremely weak and unable to ambulate d/t this. Several people knocked on his door to check on him and one man asked if he wanted anything from the store; patient goes on to state that this man was going to buy alcohol and wanted patient to ask for something so patient would pay for this man's alcohol as well.    Unable to obtain much other relevant information at this time.   Ordered lunch per preferences: salmon, mashed potatoes with gravy, a sugar cookie, and sweet tea.   Weight yesterday was 117 lb and PTA the most recently documented weight was on 02/01/20 when he weighed 135 lb. This indicates 18 lb weight loss (13.3% body weight) in the past 13 months.     Labs reviewed; CBGs: 118, 90, 106 mg/dl, Na: 678 mmol/l, K: 3.2 mmol/l, BUN: <5 mg/dl, Ca: 8.7 mg/dl, Mg: 1.6 mg/dl. Medications reviewed; 1 mg folvite/day, 4 g IV Mg sulfate x1 run 8/23, 1 tablet multivitamin with minerals/day, 40 mg oral protonix/day, 40 mEq Klor-Con every x2 doses 8/23, 100 mg oral thiamine/day.  IVF; NS @ 100 ml/hr.    NUTRITION - FOCUSED PHYSICAL EXAM:  Flowsheet Row Most Recent Value  Orbital Region Severe depletion  Upper Arm Region Severe depletion  Thoracic and Lumbar Region Severe depletion  Buccal Region Moderate depletion  Temple Region Severe depletion  Clavicle Bone Region Severe depletion  Clavicle and Acromion Bone Region Severe depletion  Scapular Bone Region Unable to assess  Dorsal Hand Severe depletion  Patellar Region Severe depletion  Anterior Thigh Region Severe depletion  Posterior Calf Region Severe depletion  Edema (RD Assessment) None  Hair Reviewed  Eyes Reviewed  Mouth Reviewed  Skin Reviewed  Nails Reviewed       Diet Order:   Diet Order             Diet Heart Room service appropriate?  Yes; Fluid consistency: Thin  Diet effective now                   EDUCATION NEEDS:   Not appropriate for education at this time  Skin:  Skin Assessment: Reviewed RN Assessment  Last BM:  PTA/unknown  Height:   Ht Readings from Last 1 Encounters:  02/22/21 6\' 2"  (1.88 m)    Weight:   Wt Readings from Last 1 Encounters:  02/22/21 53 kg     Estimated Nutritional Needs:  Kcal:  2100-2300 kcal Protein:  105-120 grams Fluid:  >/= 2.5 L/day     02/24/21,  MS, RD, LDN, CNSC Inpatient Clinical Dietitian RD pager # available in AMION  After hours/weekend pager # available in [facility]

## 2021-02-23 NOTE — Progress Notes (Addendum)
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   Notes, labs reviewed. LA normal. Procal low < 0.10 Flu/covid neg UA neg Urine tox neg Cortisol V low at 2.1 CTH: chr sinusitis, no acute changes.  Camera: Resting. Sinus tacy 120's. MAP ok, sats 100% on nasal o2.  Getting piptaz, mag replacement.  A/P: Hypotension from A fib RVR, seems new onset, HFpEF. EF 55% from 2021. CHAD2DS2-Vasc score low 2, trop wnl. Covered for sepsis, no clear source. Received fluids. Cardiology consultation . V low cortisol. Sod 133, K 4.5. Cr normal.  De escalate abx if cx negative. Chronic alcohol liver disease, low magnesium. Encephalopathy. Watch for sz's, alcohol withdrawals. Ammonia 22. 5. PEM,  Pancytopenia/Mild anemia, mostly nutritional.  6. TSH > 7, T4 >1.2 , mostly sick thyroidism. Follow through.  eICU Interventions  Continue current Rx. S/p 6 lit fluids and MAP ok. On VTE Lovenox.  Asp and sz precautions. EEG Consider Hydrocortisone if BP frops. Might need Cosyntropin test for addison's primary vs secondsry vs liver related. On MVI/thiamine. To go on CIWA protocol if worsening mentation/withdrawals.       Intervention Category Major Interventions: Sepsis - evaluation and management;Arrhythmia - evaluation and management Evaluation Type: New Patient Evaluation  Ranee Gosselin 02/23/2021, 4:42 AM  5:27 AM MAP low again.  LR 500 ml bolus Cosyntropin test Solucortef 100 mg IV once.  Discussed with RN.

## 2021-02-23 NOTE — Discharge Instructions (Signed)

## 2021-02-23 NOTE — Progress Notes (Signed)
  Echocardiogram 2D Echocardiogram has been performed.  Roy Ryan 02/23/2021, 2:20 PM

## 2021-02-23 NOTE — Progress Notes (Addendum)
PCCM Brief Interval Progress Note  Vitals stable with SBP 100 - 110 range.  Getting additional 500cc LR bolus as still dry.  Discussed with Dr. Sharl Ma - no CCM needs at this time. Will call us back if needed.  Can transfer out of SDU to tele given A.fib with RVR (cards consult currently underway).   Rutherford Guys, PA - [facility] For pager details, please see AMION or use Epic chat  After 1900, please call [facility] for cross coverage needs 02/23/2021, 9:29 AM

## 2021-02-23 NOTE — Progress Notes (Signed)
  Amiodarone Drug - Drug Interaction Consult Note  Recommendations: Moderate interaction with apixaban but no intervention required; monitor for bleeding  Magnesium replaced this AM, recommend replacing potassium; continue to monitor and replace potassium and magensium as needed  Amiodarone is metabolized by the cytochrome P450 system and therefore has the potential to cause many drug interactions. Amiodarone has an average plasma half-life of 50 days (range 20 to 100 days).   There is potential for drug interactions to occur several weeks or months after stopping treatment and the onset of drug interactions may be slow after initiating amiodarone.   []  Statins: Increased risk of myopathy. Simvastatin- restrict dose to 20mg  daily. Other statins: counsel patients to report any muscle pain or weakness immediately.  [x]  Anticoagulants: Amiodarone can increase anticoagulant effect. Consider warfarin dose reduction. Patients should be monitored closely and the dose of anticoagulant altered accordingly, remembering that amiodarone levels take several weeks to stabilize.  []  Antiepileptics: Amiodarone can increase plasma concentration of phenytoin, the dose should be reduced. Note that small changes in phenytoin dose can result in large changes in levels. Monitor patient and counsel on signs of toxicity.  []  Beta blockers: increased risk of bradycardia, AV block and myocardial depression. Sotalol - avoid concomitant use.  []   Calcium channel blockers (diltiazem and verapamil): increased risk of bradycardia, AV block and myocardial depression.  []   Cyclosporine: Amiodarone increases levels of cyclosporine. Reduced dose of cyclosporine is recommended.  []  Digoxin dose should be halved when amiodarone is started.  []  Diuretics: increased risk of cardiotoxicity if hypokalemia occurs.  []  Oral hypoglycemic agents (glyburide, glipizide, glimepiride): increased risk of hypoglycemia. Patient's glucose  levels should be monitored closely when initiating amiodarone therapy.   []  Drugs that prolong the QT interval:  Torsades de pointes risk may be increased with concurrent use - avoid if possible.  Monitor QTc, also keep magnesium/potassium WNL if concurrent therapy can't be avoided.  Antibiotics: e.g. fluoroquinolones, erythromycin.  Antiarrhythmics: e.g. quinidine, procainamide, disopyramide, sotalol.  Antipsychotics: e.g. phenothiazines, haloperidol.   Lithium, tricyclic antidepressants, and methadone. Thank You,  D  02/23/2021 12:00 PM

## 2021-02-23 NOTE — Plan of Care (Signed)
ongoing

## 2021-02-23 NOTE — Procedures (Signed)
Patient Name: Roy Ryan  MRN: [id number]  Epilepsy Attending: Charlsie Quest  Referring Physician/Provider: Dr John Giovanni, Date: 02/23/2021 Duration: 21.03 mins  Patient history: 70 year old male with altered mental status.  EEG to evaluate for seizures.  Level of alertness: Awake  AEDs during EEG study: None  Technical aspects: This EEG study was done with scalp electrodes positioned according to the 10-20 International system of electrode placement. Electrical activity was acquired at a sampling rate of 500Hz  and reviewed with a high frequency filter of 70Hz  and a low frequency filter of 1Hz . EEG data were recorded continuously and digitally stored.   Description: No clear posterior dominant rhythm was seen.  EEG showed continuous generalized 3 to 5 Hz theta-delta slowing. Hyperventilation and photic stimulation were not performed.     ABNORMALITY - Continuous slow, generalized  IMPRESSION: This study is suggestive of moderate diffuse encephalopathy, nonspecific etiology. No seizures or epileptiform discharges were seen throughout the recording.  Yuvia Plant 

## 2021-02-23 NOTE — Progress Notes (Signed)
EEG Completed; Results Pending  

## 2021-02-24 ENCOUNTER — Encounter (HOSPITAL_COMMUNITY)
Admission: EM | Disposition: A | Discharge status: Skilled Nursing Facility | Payer: Self-pay | Source: Home / Self Care | Attending: Internal Medicine

## 2021-02-24 DIAGNOSIS — I340 Nonrheumatic mitral (valve) insufficiency: Secondary | ICD-10-CM

## 2021-02-24 DIAGNOSIS — I341 Nonrheumatic mitral (valve) prolapse: Secondary | ICD-10-CM

## 2021-02-24 DIAGNOSIS — I951 Orthostatic hypotension: Secondary | ICD-10-CM | POA: Diagnosis not present

## 2021-02-24 DIAGNOSIS — I4891 Unspecified atrial fibrillation: Secondary | ICD-10-CM | POA: Diagnosis not present

## 2021-02-24 DIAGNOSIS — E860 Dehydration: Secondary | ICD-10-CM | POA: Diagnosis not present

## 2021-02-24 LAB — GLUCOSE, CAPILLARY
Glucose-Capillary: 171 mg/dL — ABNORMAL HIGH (ref 70–99)
Glucose-Capillary: 174 mg/dL — ABNORMAL HIGH (ref 70–99)
Glucose-Capillary: 179 mg/dL — ABNORMAL HIGH (ref 70–99)
Glucose-Capillary: 192 mg/dL — ABNORMAL HIGH (ref 70–99)
Glucose-Capillary: 222 mg/dL — ABNORMAL HIGH (ref 70–99)
Glucose-Capillary: 311 mg/dL — ABNORMAL HIGH (ref 70–99)

## 2021-02-24 LAB — CBC
HCT: 29.8 % — ABNORMAL LOW (ref 39.0–52.0)
Hemoglobin: 9.8 g/dL — ABNORMAL LOW (ref 13.0–17.0)
MCH: 28.3 pg (ref 26.0–34.0)
MCHC: 32.9 g/dL (ref 30.0–36.0)
MCV: 86.1 fL (ref 80.0–100.0)
Platelets: 97 10*3/uL — ABNORMAL LOW (ref 150–400)
RBC: 3.46 MIL/uL — ABNORMAL LOW (ref 4.22–5.81)
RDW: 16.3 % — ABNORMAL HIGH (ref 11.5–15.5)
WBC: 2.3 10*3/uL — ABNORMAL LOW (ref 4.0–10.5)
nRBC: 0 % (ref 0.0–0.2)

## 2021-02-24 LAB — COMPREHENSIVE METABOLIC PANEL
ALT: 8 U/L (ref 0–44)
AST: 22 U/L (ref 15–41)
Albumin: 2.9 g/dL — ABNORMAL LOW (ref 3.5–5.0)
Alkaline Phosphatase: 78 U/L (ref 38–126)
Anion gap: 7 (ref 5–15)
BUN: <5 mg/dL — ABNORMAL LOW (ref 8–23)
CO2: 20 mmol/L — ABNORMAL LOW (ref 22–32)
Calcium: 8.6 mg/dL — ABNORMAL LOW (ref 8.9–10.3)
Chloride: 107 mmol/L (ref 98–111)
Creatinine, Ser: 0.86 mg/dL (ref 0.61–1.24)
GFR, Estimated: >60 mL/min (ref >60)
Glucose, Bld: 315 mg/dL — ABNORMAL HIGH (ref 70–99)
Potassium: 4.6 mmol/L (ref 3.5–5.1)
Sodium: 134 mmol/L — ABNORMAL LOW (ref 135–145)
Total Bilirubin: 2 mg/dL — ABNORMAL HIGH (ref 0.3–1.2)
Total Protein: 6.5 g/dL (ref 6.5–8.1)

## 2021-02-24 LAB — MAGNESIUM: Magnesium: 2 mg/dL (ref 1.7–2.4)

## 2021-02-24 SURGERY — ECHOCARDIOGRAM, TRANSESOPHAGEAL
Anesthesia: Monitor Anesthesia Care

## 2021-02-24 MED ORDER — HEPARIN (PORCINE) 25000 UT/250ML-% IV SOLN
800.0000 [IU]/h | INTRAVENOUS | Status: DC
  Administered 2021-02-24: 800 [IU]/h via INTRAVENOUS
  Filled 2021-02-24: qty 250

## 2021-02-24 MED ORDER — SODIUM CHLORIDE 0.9 % IV SOLN: INTRAVENOUS | Status: DC

## 2021-02-24 MED ORDER — AMOXICILLIN-POT CLAVULANATE 875-125 MG PO TABS
1.0000 | ORAL_TABLET | Freq: Two times a day (BID) | ORAL | Status: AC
  Administered 2021-02-24 – 2021-03-01 (×11): 1 via ORAL
  Filled 2021-02-24 (×10): qty 1

## 2021-02-24 NOTE — Progress Notes (Signed)
 Progress Note  Patient Name: Roy Ryan Date of Encounter: 02/24/2021  [facility] Cardiologist: Jonathan Berry, MD   Subjective   Denies any chest pain or SOB  Inpatient Medications    Scheduled Meds:  (feeding supplement) PROSource Plus  30 mL Oral BID BM   amoxicillin-clavulanate  1 tablet Oral Q12H   apixaban  5 mg Oral BID   Chlorhexidine Gluconate Cloth  6 each Topical Daily   ciprofloxacin  1 drop Right Eye Q4H while awake   feeding supplement  1 Container Oral BID BM   feeding supplement  237 mL Oral Q24H   folic acid  1 mg Oral Daily   multivitamin with minerals  1 tablet Oral Daily   pantoprazole  40 mg Oral Daily   pneumococcal 23 valent vaccine  0.5 mL Intramuscular Tomorrow-1000   thiamine  100 mg Oral Daily   Or   thiamine  100 mg Intravenous Daily   Continuous Infusions:  sodium chloride 100 mL/hr at 02/24/21 0935   amiodarone 30 mg/hr (02/23/21 1820)   sodium chloride     PRN Meds: acetaminophen **OR** acetaminophen, LORazepam **OR** LORazepam, sodium chloride   Vital Signs    Vitals:   02/24/21 0600 02/24/21 0700 02/24/21 0800 02/24/21 0900  BP: (!) 86/71 92/72 101/72 96/72  Pulse: 92 84 92 (!) 103  Resp: 13     Temp:   97.9 F (36.6 C)   TempSrc:   Oral   SpO2: 99%     Weight:      Height:        Intake/Output Summary (Last 24 hours) at 02/24/2021 1002 Last data filed at 02/24/2021 0600 Gross per 24 hour  Intake 1814.03 ml  Output 300 ml  Net 1514.03 ml   Last 3 Weights 02/22/2021 02/01/2020 01/31/2020  Weight (lbs) 116 lb 13.5 oz 135 lb 12.8 oz 165 lb  Weight (kg) 53 kg 61.598 kg 74.844 kg      Telemetry    Atrial fibrillation with CVR in the 80's - Personally Reviewed  ECG    No new EKG to review- Personally Reviewed  Physical Exam   GEN: No acute distress.  Thin and cachectic appearing Neck: No JVD Cardiac: irregularly irregular, no rubs, or gallops. 2/6 holosystolic murmur at the LLSB to the apex Respiratory: Clear  to auscultation bilaterally. GI: Soft, nontender, non-distended  MS: No edema; No deformity. Neuro:  Nonfocal  Psych: Normal affect   Labs    High Sensitivity Troponin:   Recent Labs  Lab 02/22/21 1310 02/22/21 1605  TROPONINIHS 6 7      Chemistry Recent Labs  Lab 02/22/21 1310 02/23/21 0047 02/23/21 0937 02/24/21 0303  NA 133*  --  133* 134*  K 4.5  --  3.2* 4.6  CL 97*  --  105 107  CO2 22  --  20* 20*  GLUCOSE 250*  --  114* 315*  BUN 5*  --  <5* <5*  CREATININE 0.83 0.71 0.69 0.86  CALCIUM 8.5*  --  8.7* 8.6*  PROT 6.5  --   --  6.5  ALBUMIN 3.0*  --   --  2.9*  AST 31  --   --  22  ALT 16  --   --  8  ALKPHOS 75  --   --  78  BILITOT 3.5*  --  3.1* 2.0*  GFRNONAA >60 >60 >60 >60  ANIONGAP 14  --  8 7     Hematology   Recent Labs  Lab 02/22/21 1310 02/23/21 0937 02/24/21 0303  WBC 3.0* 4.3 2.3*  RBC 3.59* 3.70*  3.64* 3.46*  HGB 10.3* 10.5* 9.8*  HCT 31.9* 32.5* 29.8*  MCV 88.9 87.8 86.1  MCH 28.7 28.4 28.3  MCHC 32.3 32.3 32.9  RDW 16.1* 16.3* 16.3*  PLT 92* 101* 97*    BNPNo results for input(s): BNP, PROBNP in the last 168 hours.   DDimer  Recent Labs  Lab 02/23/21 0937  DDIMER 5.33*     CHA2DS2-VASc Score = 1  This indicates a 0.6% annual risk of stroke. The patient's score is based upon: CHF History: No HTN History: No Diabetes History: No Stroke History: No Vascular Disease History: No Age Score: 1 Gender Score: 0       Radiology    CT HEAD WO CONTRAST (5MM)  Result Date: 02/22/2021 CLINICAL DATA:  Delirium EXAM: CT HEAD WITHOUT CONTRAST TECHNIQUE: Contiguous axial images were obtained from the base of the skull through the vertex without intravenous contrast. COMPARISON:  01/31/2020 FINDINGS: Brain: No evidence of acute infarction, hemorrhage, hydrocephalus, extra-axial collection or mass lesion/mass effect. Mild low-density changes within the periventricular and subcortical white matter compatible with chronic  microvascular ischemic change. Mild diffuse cerebral volume loss. Vascular: Atherosclerotic calcifications involving the large vessels of the skull base. No unexpected hyperdense vessel. Skull: Normal. Negative for fracture or focal lesion. Sinuses/Orbits: Chronic complete right maxillary sinus opacification with associated bony thickening of the maxillary sinus walls. Mucosal thickening within the bilateral ethmoid air cells and bilateral frontal sinuses. Air-fluid level in the left frontal sinus. Other: None. IMPRESSION: 1. No acute intracranial findings. 2. Mild chronic microvascular ischemic change and cerebral volume loss. 3. Chronic right maxillary sinus disease. Air-fluid level in the left frontal sinus may represent acute on chronic sinusitis. Electronically Signed   By: Nicholas  Plundo D.O.   On: 02/22/2021 16:02   DG Chest Portable 1 View  Addendum Date: 02/23/2021   ADDENDUM REPORT: 02/23/2021 12:15 ADDENDUM: Voice recognition error: The last sentence of the Findings section should read "compression fractures at T5, T6 and T7. These fractures are in the thoracic spine. They are stable from the CT scan 01/01/2020. No evidence for progression. " Electronically Signed   By: Christopher  Mattern M.D.   On: 02/23/2021 12:15   Result Date: 02/23/2021 CLINICAL DATA:  Delirium. EXAM: PORTABLE CHEST 1 VIEW COMPARISON:  One-view scratched at two-view chest x-ray and CTA chest 01/31/2020 FINDINGS: Heart is upper limits of normal. 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 No edema or effusion is present. No focal airspace disease present. Degenerative changes are again noted in the thoracic spine. Progression fractures are again noted at C5, C6, and C7. IMPRESSION: 1. No acute cardiopulmonary disease or significant interval change. 2. Progression fractures at C5, C6, and C7. Electronically Signed: By: Christopher  Mattern M.D. On: 02/22/2021 14:43   EEG adult  Result Date: 02/23/2021 Yadav, Priyanka O, MD     02/23/2021  12:51 PM Patient Name: Roy Ryan MRN: [id number] Epilepsy Attending: Priyanka O Yadav Referring Physician/Provider: Dr Rathore, Vasundhra, Date: 02/23/2021 Duration: 21.03 mins Patient history: 69-year-old male with altered mental status.  EEG to evaluate for seizures. Level of alertness: Awake AEDs during EEG study: None Technical aspects: This EEG study was done with scalp electrodes positioned according to the 10-20 International system of electrode placement. Electrical activity was acquired at a sampling rate of 500Hz and reviewed with a high frequency filter of 70Hz and a low frequency filter of   1Hz. EEG data were recorded continuously and digitally stored. Description: No clear posterior dominant rhythm was seen.  EEG showed continuous generalized 3 to 5 Hz theta-delta slowing. Hyperventilation and photic stimulation were not performed.   ABNORMALITY - Continuous slow, generalized IMPRESSION: This study is suggestive of moderate diffuse encephalopathy, nonspecific etiology. No seizures or epileptiform discharges were seen throughout the recording. Priyanka O Yadav   ECHOCARDIOGRAM COMPLETE  Result Date: 02/23/2021    ECHOCARDIOGRAM REPORT   Patient Name:   Abou Barton Date of Exam: 02/23/2021 Medical Rec #:  [id number]    Height:       74.0 in Accession #:    [id number]   Weight:       116.8 lb Date of Birth:  02/21/1951     BSA:          1.730 m Patient Age:    69 years     BP:           91/63 mmHg Patient Gender: M            HR:           88 bpm. Exam Location:  Inpatient Procedure: 2D Echo, 3D Echo, Cardiac Doppler and Color Doppler Indications:    I48.91* Unspeicified atrial fibrillation  History:        Patient has prior history of Echocardiogram examinations, most                 recent 01/31/2020. CHF, Abnormal ECG, Mitral Valve Prolapse and                 Mitral Valve Disease, Arrythmias:Atrial Fibrillation;                 Signs/Symptoms:Hypotension and Chest Pain. ETOH.  Sonographer:    Tina  West RDCS Referring Phys: [id number] VASUNDHRA RATHORE IMPRESSIONS  1. 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 Very eccentric, anteriorly directed jet of mitral regurgitation. P2 scallop prolapse/possible flail noted on 3D images.. The mitral valve is myxomatous. Severe mitral valve regurgitation. No evidence of mitral stenosis.  2. Left ventricular ejection fraction, by estimation, is 50%. Left ventricular ejection fraction by 3D volume is 53 %. The left ventricle has mildly decreased function. The left ventricle has no regional wall motion abnormalities. There is mild left ventricular hypertrophy. Left ventricular diastolic parameters are indeterminate.  3. Right ventricular systolic function is moderately reduced. The right ventricular size is mildly enlarged. There is moderately elevated pulmonary artery systolic pressure. The estimated right ventricular systolic pressure is 52.2 mmHg.  4. Left atrial size was severely dilated.  5. Right atrial size was moderately dilated.  6. Tricuspid valve regurgitation is moderate.  7. The aortic valve is tricuspid. There is mild thickening of the aortic valve. Aortic valve regurgitation is mild. No aortic stenosis is present.  8. Aortic dilatation noted. There is borderline dilatation of the aortic root, measuring 39 mm.  9. The inferior vena cava is dilated in size with <50% respiratory variability, suggesting right atrial pressure of 15 mmHg. Conclusion(s)/Recommendation(s): Recommend TEE for further evaluation of mitral valve prolapse. FINDINGS  Left Ventricle: Left ventricular ejection fraction, by estimation, is 50%. Left ventricular ejection fraction by 3D volume is 53 %. The left ventricle has mildly decreased function. The left ventricle has no regional wall motion abnormalities. The left ventricular internal cavity size was normal in size. There is mild left ventricular hypertrophy. Left ventricular diastolic parameters are indeterminate. Right  Ventricle: The right ventricular size   is mildly enlarged. No increase in right ventricular wall thickness. Right ventricular systolic function is moderately reduced. There is moderately elevated pulmonary artery systolic pressure. The tricuspid regurgitant velocity is 3.05 m/s, and with an assumed right atrial pressure of 15 mmHg, the estimated right ventricular systolic pressure is 52.2 mmHg. Left Atrium: Left atrial size was severely dilated. Right Atrium: Right atrial size was moderately dilated. Pericardium: There is no evidence of pericardial effusion. Mitral Valve: Bileaflet mitral valve prolapse, posterior > anterior. Very eccentric, anteriorly directed jet of mitral regurgitation. P2 scallop prolapse/possible flail noted on 3D images. The mitral valve is myxomatous. Severe mitral valve regurgitation. No evidence of mitral valve stenosis. Tricuspid Valve: The tricuspid valve is normal in structure. Tricuspid valve regurgitation is moderate . No evidence of tricuspid stenosis. Aortic Valve: The aortic valve is tricuspid. There is mild thickening of the aortic valve. Aortic valve regurgitation is mild. Aortic regurgitation PHT measures 1091 msec. No aortic stenosis is present. Pulmonic Valve: The pulmonic valve was grossly normal. Pulmonic valve regurgitation is trivial. No evidence of pulmonic stenosis. Aorta: Aortic dilatation noted. There is borderline dilatation of the aortic root, measuring 39 mm. Venous: The inferior vena cava is dilated in size with less than 50% respiratory variability, suggesting right atrial pressure of 15 mmHg. IAS/Shunts: No atrial level shunt detected by color flow Doppler.  LEFT VENTRICLE PLAX 2D LVIDd:         4.70 cm LVIDs:         3.10 cm LV PW:         1.00 cm         3D Volume EF LV IVS:        1.10 cm         LV 3D EF:    Left LVOT diam:     1.90 cm                      ventricul LVOT Area:     2.84 cm                     ar                                              ejection                                             fraction LV Volumes (MOD)                            by 3D LV vol d, MOD    110.0 ml                   volume is A2C:                                        53 %. LV vol d, MOD    77.2 ml A4C: LV vol s, MOD    68.3 ml       3D Volume EF: A2C:                             3D EF:        53 % LV vol s, MOD    43.9 ml A4C: LV SV MOD A2C:   41.7 ml LV SV MOD A4C:   77.2 ml LV SV MOD BP:    40.3 ml RIGHT VENTRICLE            IVC RV S prime:     4.79 cm/s  IVC diam: 2.40 cm TAPSE (M-mode): 1.0 cm LEFT ATRIUM              Index       RIGHT ATRIUM           Index LA diam:        4.80 cm  2.78 cm/m  RA Area:     24.20 cm LA Vol (A2C):   170.0 ml 98.29 ml/m RA Volume:   86.80 ml  50.18 ml/m LA Vol (A4C):   71.1 ml  41.11 ml/m LA Biplane Vol: 111.0 ml 64.18 ml/m  AORTIC VALVE AI PHT:      1091 msec  AORTA Ao Root diam: 3.90 cm Ao Asc diam:  3.00 cm MITRAL VALVE                 TRICUSPID VALVE MV Area (PHT): 5.27 cm      TR Peak grad:   37.2 mmHg MV Decel Time: 144 msec      TR Vmax:        305.00 cm/s MR Peak grad:    72.9 mmHg MR Mean grad:    43.0 mmHg   SHUNTS MR Vmax:         427.00 cm/s Systemic Diam: 1.90 cm MR Vmean:        308.0 cm/s MR PISA:         6.93 cm MR PISA Eff ROA: 62 mm MR PISA Radius:  1.05 cm MV E velocity: 143.67 cm/s Gayatri Acharya MD Electronically signed by Gayatri Acharya MD Signature Date/Time: 02/23/2021/5:18:37 PM    Final     Cardiac Studies   Relevant CV Studies:   Echo 01/31/20 1. Left ventricular ejection fraction, by estimation, is 50 to 55%. The  left ventricle has low normal function. The left ventricle has no regional  wall motion abnormalities. Left ventricular diastolic parameters are  indeterminate.   2. Right ventricular systolic function is moderately reduced. The right  ventricular size is normal. There is moderately elevated pulmonary artery  systolic pressure. The estimated right ventricular systolic pressure is   53.7 mmHg.   3. Left atrial size was severely dilated.   4. Right atrial size was moderately dilated.   5. The mitral valve is myxomatous. There is moderate holosystolic  prolapse of the medial scallop of the posterior leaflet of the mitral  valve. The anterior leaflet is mildly elongated. Normal mobility of the  mitral valve leaflets. Moderate mitral valve  regurgitation, with eccentric anteriorly directed jet. No evidence of  mitral valve stenosis.   6. The aortic valve is tricuspid. Aortic valve regurgitation is not  visualized. Mild aortic valve sclerosis is present, with no evidence of  aortic valve stenosis.   7. The inferior vena cava is dilated in size with >50% respiratory  variability, suggesting right atrial pressure of 8 mmHg.   8. Consider TEE for further evaluation of mital valve.   2D echo 02/23/2021 IMPRESSIONS    1. 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 Very eccentric,  anteriorly directed jet of mitral regurgitation. P2   scallop  prolapse/possible flail noted on 3D images.. The mitral valve is  myxomatous. Severe mitral valve regurgitation. No  evidence of mitral stenosis.   2. Left ventricular ejection fraction, by estimation, is 50%. Left  ventricular ejection fraction by 3D volume is 53 %. The left ventricle has  mildly decreased function. The left ventricle has no regional wall motion  abnormalities. There is mild left  ventricular hypertrophy. Left ventricular diastolic parameters are  indeterminate.   3. Right ventricular systolic function is moderately reduced. The right  ventricular size is mildly enlarged. There is moderately elevated  pulmonary artery systolic pressure. The estimated right ventricular  systolic pressure is 52.2 mmHg.   4. Left atrial size was severely dilated.   5. Right atrial size was moderately dilated.   6. Tricuspid valve regurgitation is moderate.   7. The aortic valve is tricuspid. There is mild thickening of  the aortic  valve. Aortic valve regurgitation is mild. No aortic stenosis is present.   8. Aortic dilatation noted. There is borderline dilatation of the aortic  root, measuring 39 mm.   9. The inferior vena cava is dilated in size with <50% respiratory  variability, suggesting right atrial pressure of 15 mmHg.   Conclusion(s)/Recommendation(s): Recommend TEE for further evaluation of  mitral valve prolapse.   Patient Profile     69 y.o. male with a hx of mitral regurgitation with prolapse, pulmonary hypertension, homelessness, paroxysmal atrial flutter and alcohol abuse who is being seen 02/23/2021 for the evaluation of atrial fibrillation at the request of Dr. Lama.  Assessment & Plan    Atrial fibrillation with rapid ventricular rate -Patient was found to be in atrial fibrillation in setting up hypoglycemia, hypotension, possible sepsis of unknown etiology (resolved) and adrenal insufficiency. -He has very poor living conditions with out food or water at the hotel he has been staying out for unknown length of time. -hospitalized in 2021 with atrial flutter and left AMA and never followed up>>at that time deemed not a candidate for anticoagulation due to his low CHADS2VASC score of 1 and poor social situation with high likelihood of not following up, noncompliance with meds and ETOH abuse -suspect that he has been in atrial flutter since 2021 -uncontrolled afib likely related to rebound from hypotension and has improved with IVF and with IV Amio -2D echo with low normal LVF and now severe MR and severe LAE likely driving ongoing atrial arrhythmias -BP too soft for addition of CCB or BB -continue IV Amio for now as HR is controlled in the 80's -will stop Eliquis and place on IV Heparin gtt for now while workup of severe MR in progress but again do not feel he is a long term candidate for anticoagulation.   -Could consider MAZE procedure if he is deemed a surgical candidate for MV repair or  consider Watchman but would still have to be anticoagulated for 4 weeks after device placement   CHA2DS2-VASc Score = 1  This indicates a 0.6% annual risk of stroke. 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 in setting of severe MR and also has   moderate TR with elevated RAP of 15mmHg -recommend TEE for further assessment and help to guide appropriate therapy -will make NPO after MN for TEE tomorrow -CVTS consult pending results of TEE -Shared Decision Making/Informed Consent The risks [esophageal damage, perforation (1:10,000 risk), bleeding, pharyngeal hematoma as well as other potential complications associated with conscious sedation including aspiration, arrhythmia, respiratory failure and death], benefits (treatment guidance and diagnostic support) and alternatives of a transesophageal echocardiogram were discussed in detail with Roy Ryan and he is willing to proceed.     Adrenal insufficiency -Undergoing work-up per primary team   Alcohol abuse -On CIWA protocol    I have spent a total of 35 minutes with patient reviewing 2D echo , telemetry, EKGs, labs and examining patient as well as establishing an assessment and plan that was discussed with the patient.  > 50% of time was spent in direct patient care.     For questions or updates, please contact [facility] Please consult [email or web address] for contact info under        Signed, Jahlisa Rossitto, MD  02/24/2021, 10:02 AM    

## 2021-02-24 NOTE — Progress Notes (Addendum)
Inpatient Diabetes Program Recommendations  AACE/ADA: New Consensus Statement on Inpatient Glycemic Control (2015)  Target Ranges:  Prepandial:   less than 140 mg/dL      Peak postprandial:   less than 180 mg/dL (1-2 hours)      Critically ill patients:  140 - 180 mg/dL   Lab Results  Component Value Date   GLUCAP 222 (H) 02/24/2021   HGBA1C 5.4 01/31/2020    Review of Glycemic Control Results for Roy Ryan, Roy Ryan (MRN [id number]) as of 02/24/2021 09:12  Ref. Range 02/23/2021 07:46 02/23/2021 11:19 02/23/2021 15:42 02/23/2021 19:40 02/24/2021 00:21 02/24/2021 08:03  Glucose-Capillary Latest Ref Range: 70 - 99 mg/dL 90 568 (H) 127 (H) 517 (H) 311 (H) 222 (H)   Diabetes history: None A1c 5.4% on 01/31/2020 Current orders for Inpatient glycemic control:  None  Boost bid between meals  Inpatient Diabetes Program Recommendations:    Glucose trends increased after SoluCortef dose 100 mg given yesterday for Cortisone stimulation test this am  -   add Novolog 0-6 units tid   Thanks,  Christena Deem RN, MSN, BC-ADM Inpatient Diabetes Coordinator Team Pager [phone] (8a-5p)

## 2021-02-24 NOTE — Anesthesia Preprocedure Evaluation (Addendum)
Anesthesia Evaluation  Patient identified by MRN, date of birth, ID band Patient awake    Reviewed: Allergy & Precautions, NPO status , Patient's Chart, lab work & pertinent test results  Airway Mallampati: II  TM Distance: >3 FB Neck ROM: Full    Dental  (+) Poor Dentition   Pulmonary neg pulmonary ROS,    Pulmonary exam normal        Cardiovascular +CHF  + dysrhythmias Atrial Fibrillation + Valvular Problems/Murmurs MR  Rhythm:Irregular Rate:Normal + Diastolic murmurs    Neuro/Psych Seizures -,  negative psych ROS   GI/Hepatic negative GI ROS, (+)     substance abuse  alcohol use,   Endo/Other  negative endocrine ROS  Renal/GU negative Renal ROS  negative genitourinary   Musculoskeletal negative musculoskeletal ROS (+)   Abdominal (+)  Abdomen: soft.    Peds  Hematology negative hematology ROS (+)   Anesthesia Other Findings   Reproductive/Obstetrics                            Anesthesia Physical Anesthesia Plan  ASA: 3  Anesthesia Plan: MAC   Post-op Pain Management:    Induction: Intravenous  PONV Risk Score and Plan: 1 and Propofol infusion and Treatment may vary due to age or medical condition  Airway Management Planned: Simple Face Mask, Natural Airway and Nasal Cannula  Additional Equipment: None  Intra-op Plan:   Post-operative Plan:   Informed Consent: I have reviewed the patients History and Physical, chart, labs and discussed the procedure including the risks, benefits and alternatives for the proposed anesthesia with the patient or authorized representative who has indicated his/her understanding and acceptance.     Dental advisory given  Plan Discussed with: CRNA  Anesthesia Plan Comments: (Lab Results      Component                Value               Date                      WBC                      2.3 (L)             02/24/2021                HGB                       9.8 (L)             02/24/2021                HCT                      29.8 (L)            02/24/2021                MCV                      86.1                02/24/2021                PLT  97 (L)              02/24/2021           Lab Results      Component                Value               Date                      NA                       134 (L)             02/24/2021                K                        4.6                 02/24/2021                CO2                      20 (L)              02/24/2021                GLUCOSE                  315 (H)             02/24/2021                BUN                      <5 (L)              02/24/2021                CREATININE               0.86                02/24/2021                CALCIUM                  8.6 (L)             02/24/2021                GFRNONAA                 >60                 02/24/2021                GFRAA                    >60                 02/01/2020           TTE 02/23/21: 1. 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 Very eccentric,  anteriorly directed jet of mitral regurgitation. P2 scallop  prolapse/possible flail noted on 3D images.. The mitral valve is  myxomatous. Severe mitral valve regurgitation. No  evidence of mitral stenosis.  2. Left ventricular ejection fraction, by estimation, is 50%. Left  ventricular  ejection fraction by 3D volume is 53 %. The left ventricle has  mildly decreased function. The left ventricle has no regional wall motion  abnormalities. There is mild left  ventricular hypertrophy. Left ventricular diastolic parameters are  indeterminate.  3. Right ventricular systolic function is moderately reduced. The right  ventricular size is mildly enlarged. There is moderately elevated  pulmonary artery systolic pressure. The estimated right ventricular  systolic pressure is 59.9 mmHg.  4. Left atrial size was severely dilated.   5. Right atrial size was moderately dilated.  6. Tricuspid valve regurgitation is moderate.  7. The aortic valve is tricuspid. There is mild thickening of the aortic  valve. Aortic valve regurgitation is mild. No aortic stenosis is present.  8. Aortic dilatation noted. There is borderline dilatation of the aortic  root, measuring 39 mm.  9. The inferior vena cava is dilated in size with <50% respiratory  variability, suggesting right atrial pressure of 15 mmHg. )       Anesthesia Quick Evaluation

## 2021-02-24 NOTE — Progress Notes (Signed)
ANTICOAGULATION CONSULT NOTE - Initial Consult  Pharmacy Consult for Heparin  Indication: atrial fibrillation  No Known Allergies  Patient Measurements: Height: 6\' 2"  (188 cm) Weight: 53 kg (116 lb 13.5 oz) IBW/kg (Calculated) : 82.2 Heparin Dosing Weight: actual weight   Vital Signs: Temp: 97.9 F (36.6 C) (08/24 0800) Temp Source: Oral (08/24 0800) BP: 96/72 (08/24 0900) Pulse Rate: 103 (08/24 0900)  Labs: Recent Labs    02/22/21 1310 02/22/21 1605 02/23/21 0047 02/23/21 0937 02/24/21 0303  HGB 10.3*  --   --  10.5* 9.8*  HCT 31.9*  --   --  32.5* 29.8*  PLT 92*  --   --  101* 97*  CREATININE 0.83  --  0.71 0.69 0.86  TROPONINIHS 6 7  --   --   --     Estimated Creatinine Clearance: 62.5 mL/min (by C-G formula based on SCr of 0.86 mg/dL).   Medical History: Past Medical History:  Diagnosis Date   Alcohol abuse    Atrial flutter (HCC)    Mitral regurgitation    Seizures [facility])    Assessment: 70 y/o M being worked up by cardiology for atrial fibrillation and mitral regurgitation with prolapse. Patient is currently on Eliquis and is scheduled for TEE in AM. Pharmacy consulted to transition to heparin infusion.   02/24/21  - Hgb and PLTs low but stable - no significant bleeding reported - given last apixaban dose around 0930 this am  Goal of Therapy:  Heparin level 0.3-0.7 units/ml aPTT 66-102 seconds Monitor platelets by anticoagulation protocol: Yes   Plan:  Initiate heparin infusion at 800 units/hr this PM at 2130 aPTT 6 hours after initiating infusion F/U plans for anticoagulation after TEE  2131 D 02/24/2021,10:31 AM

## 2021-02-24 NOTE — Progress Notes (Signed)
Consent obtained for TEE . Placed in pts chart.

## 2021-02-24 NOTE — H&P (View-Only) (Signed)
Progress Note  Patient Name: Roy Ryan Date of Encounter: 02/24/2021  [facility] Cardiologist: Nanetta Batty, MD   Subjective   Denies any chest pain or SOB  Inpatient Medications    Scheduled Meds:  (feeding supplement) PROSource Plus  30 mL Oral BID BM   amoxicillin-clavulanate  1 tablet Oral Q12H   apixaban  5 mg Oral BID   Chlorhexidine Gluconate Cloth  6 each Topical Daily   ciprofloxacin  1 drop Right Eye Q4H while awake   feeding supplement  1 Container Oral BID BM   feeding supplement  237 mL Oral Q24H   folic acid  1 mg Oral Daily   multivitamin with minerals  1 tablet Oral Daily   pantoprazole  40 mg Oral Daily   pneumococcal 23 valent vaccine  0.5 mL Intramuscular Tomorrow-1000   thiamine  100 mg Oral Daily   Or   thiamine  100 mg Intravenous Daily   Continuous Infusions:  sodium chloride 100 mL/hr at 02/24/21 0935   amiodarone 30 mg/hr (02/23/21 1820)   sodium chloride     PRN Meds: acetaminophen **OR** acetaminophen, LORazepam **OR** LORazepam, sodium chloride   Vital Signs    Vitals:   02/24/21 0600 02/24/21 0700 02/24/21 0800 02/24/21 0900  BP: (!) 86/71 92/72 101/72 96/72  Pulse: 92 84 92 (!) 103  Resp: 13     Temp:   97.9 F (36.6 C)   TempSrc:   Oral   SpO2: 99%     Weight:      Height:        Intake/Output Summary (Last 24 hours) at 02/24/2021 1002 Last data filed at 02/24/2021 0600 Gross per 24 hour  Intake 1814.03 ml  Output 300 ml  Net 1514.03 ml   Last 3 Weights 02/22/2021 02/01/2020 01/31/2020  Weight (lbs) 116 lb 13.5 oz 135 lb 12.8 oz 165 lb  Weight (kg) 53 kg 61.598 kg 74.844 kg      Telemetry    Atrial fibrillation with CVR in the 80's - Personally Reviewed  ECG    No new EKG to review- Personally Reviewed  Physical Exam   GEN: No acute distress.  Thin and cachectic appearing Neck: No JVD Cardiac: irregularly irregular, no rubs, or gallops. 2/6 holosystolic murmur at the LLSB to the apex Respiratory: Clear  to auscultation bilaterally. GI: Soft, nontender, non-distended  MS: No edema; No deformity. Neuro:  Nonfocal  Psych: Normal affect   Labs    High Sensitivity Troponin:   Recent Labs  Lab 02/22/21 1310 02/22/21 1605  TROPONINIHS 6 7      Chemistry Recent Labs  Lab 02/22/21 1310 02/23/21 0047 02/23/21 0937 02/24/21 0303  NA 133*  --  133* 134*  K 4.5  --  3.2* 4.6  CL 97*  --  105 107  CO2 22  --  20* 20*  GLUCOSE 250*  --  114* 315*  BUN 5*  --  <5* <5*  CREATININE 0.83 0.71 0.69 0.86  CALCIUM 8.5*  --  8.7* 8.6*  PROT 6.5  --   --  6.5  ALBUMIN 3.0*  --   --  2.9*  AST 31  --   --  22  ALT 16  --   --  8  ALKPHOS 75  --   --  78  BILITOT 3.5*  --  3.1* 2.0*  GFRNONAA >60 >60 >60 >60  ANIONGAP 14  --  8 7     Hematology  Recent Labs  Lab 02/22/21 1310 02/23/21 0937 02/24/21 0303  WBC 3.0* 4.3 2.3*  RBC 3.59* 3.70*  3.64* 3.46*  HGB 10.3* 10.5* 9.8*  HCT 31.9* 32.5* 29.8*  MCV 88.9 87.8 86.1  MCH 28.7 28.4 28.3  MCHC 32.3 32.3 32.9  RDW 16.1* 16.3* 16.3*  PLT 92* 101* 97*    BNPNo results for input(s): BNP, PROBNP in the last 168 hours.   DDimer  Recent Labs  Lab 02/23/21 [phone]  DDIMER 5.33*     CHA2DS2-VASc Score = 1  This indicates a 0.6% annual risk of stroke. The patient's score is based upon: CHF History: No HTN History: No Diabetes History: No Stroke History: No Vascular Disease History: No Age Score: 1 Gender Score: 0       Radiology    CT HEAD WO CONTRAST ( )  Result Date: 02/22/2021 CLINICAL DATA:  Delirium EXAM: CT HEAD WITHOUT CONTRAST TECHNIQUE: Contiguous axial images were obtained from the base of the skull through the vertex without intravenous contrast. COMPARISON:  01/31/2020 FINDINGS: Brain: No evidence of acute infarction, hemorrhage, hydrocephalus, extra-axial collection or mass lesion/mass effect. Mild low-density changes within the periventricular and subcortical white matter compatible with chronic  microvascular ischemic change. Mild diffuse cerebral volume loss. Vascular: Atherosclerotic calcifications involving the large vessels of the skull base. No unexpected hyperdense vessel. Skull: Normal. Negative for fracture or focal lesion. Sinuses/Orbits: Chronic complete right maxillary sinus opacification with associated bony thickening of the maxillary sinus walls. Mucosal thickening within the bilateral ethmoid air cells and bilateral frontal sinuses. Air-fluid level in the left frontal sinus. Other: None. IMPRESSION: 1. No acute intracranial findings. 2. Mild chronic microvascular ischemic change and cerebral volume loss. 3. Chronic right maxillary sinus disease. Air-fluid level in the left frontal sinus may represent acute on chronic sinusitis. Electronically Signed   By: Duanne Guess D.O.   On: 02/22/2021 16:02   DG Chest Portable 1 View  Addendum Date: 02/23/2021   ADDENDUM REPORT: 02/23/2021 12:15 ADDENDUM: Voice recognition error: The last sentence of the Findings section should read "compression fractures at T5, T6 and T7. These fractures are in the thoracic spine. They are stable from the CT scan 01/01/2020. No evidence for progression. " Electronically Signed   By: Marin Roberts M.D.   On: 02/23/2021 12:15   Result Date: 02/23/2021 CLINICAL DATA:  Delirium. EXAM: PORTABLE CHEST 1 VIEW COMPARISON:  One-view scratched at two-view chest x-ray and CTA chest 01/31/2020 FINDINGS: Heart is upper limits of normal. 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 No edema or effusion is present. No focal airspace disease present. Degenerative changes are again noted in the thoracic spine. Progression fractures are again noted at C5, C6, and C7. IMPRESSION: 1. No acute cardiopulmonary disease or significant interval change. 2. Progression fractures at C5, C6, and C7. Electronically Signed: By: Marin Roberts M.D. On: 02/22/2021 14:43   EEG adult  Result Date: 02/23/2021 Charlsie Quest, MD     02/23/2021  12:51 PM Patient Name: Roy Ryan MRN: [id number] Epilepsy Attending: Charlsie Quest Referring Physician/Provider: Dr John Giovanni, Date: 02/23/2021 Duration: 21.03 mins Patient history: 70 year old male with altered mental status.  EEG to evaluate for seizures. Level of alertness: Awake AEDs during EEG study: None Technical aspects: This EEG study was done with scalp electrodes positioned according to the 10-20 International system of electrode placement. Electrical activity was acquired at a sampling rate of 500Hz  and reviewed with a high frequency filter of 70Hz  and a low frequency filter of  1Hz . EEG data were recorded continuously and digitally stored. Description: No clear posterior dominant rhythm was seen.  EEG showed continuous generalized 3 to 5 Hz theta-delta slowing. Hyperventilation and photic stimulation were not performed.   ABNORMALITY - Continuous slow, generalized IMPRESSION: This study is suggestive of moderate diffuse encephalopathy, nonspecific etiology. No seizures or epileptiform discharges were seen throughout the recording. Charlsie Questriyanka O Yadav   ECHOCARDIOGRAM COMPLETE  Result Date: 02/23/2021    ECHOCARDIOGRAM REPORT   Patient Name:   Arnetha CourserWILLIAM Klecka Date of Exam: 02/23/2021 Medical Rec #:  [id number]    Height:       74.0 in Accession #:    [id number]   Weight:       116.8 lb Date of Birth:  06/15/1951     BSA:          1.730 m Patient Age:    69 years     BP:           91/63 mmHg Patient Gender: M            HR:           88 bpm. Exam Location:  Inpatient Procedure: 2D Echo, 3D Echo, Cardiac Doppler and Color Doppler Indications:    I48.91* Unspeicified atrial fibrillation  History:        Patient has prior history of Echocardiogram examinations, most                 recent 01/31/2020. CHF, Abnormal ECG, Mitral Valve Prolapse and                 Mitral Valve Disease, Arrythmias:Atrial Fibrillation;                 Signs/Symptoms:Hypotension and Chest Pain. ETOH.  Sonographer:    Sheralyn Boatmanina  West RDCS Referring Phys: [id number] VASUNDHRA RATHORE IMPRESSIONS  1. 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 Very eccentric, anteriorly directed jet of mitral regurgitation. P2 scallop prolapse/possible flail noted on 3D images.. The mitral valve is myxomatous. Severe mitral valve regurgitation. No evidence of mitral stenosis.  2. Left ventricular ejection fraction, by estimation, is 50%. Left ventricular ejection fraction by 3D volume is 53 %. The left ventricle has mildly decreased function. The left ventricle has no regional wall motion abnormalities. There is mild left ventricular hypertrophy. Left ventricular diastolic parameters are indeterminate.  3. Right ventricular systolic function is moderately reduced. The right ventricular size is mildly enlarged. There is moderately elevated pulmonary artery systolic pressure. The estimated right ventricular systolic pressure is 52.2 mmHg.  4. Left atrial size was severely dilated.  5. Right atrial size was moderately dilated.  6. Tricuspid valve regurgitation is moderate.  7. The aortic valve is tricuspid. There is mild thickening of the aortic valve. Aortic valve regurgitation is mild. No aortic stenosis is present.  8. Aortic dilatation noted. There is borderline dilatation of the aortic root, measuring 39 mm.  9. The inferior vena cava is dilated in size with <50% respiratory variability, suggesting right atrial pressure of 15 mmHg. Conclusion(s)/Recommendation(s): Recommend TEE for further evaluation of mitral valve prolapse. FINDINGS  Left Ventricle: Left ventricular ejection fraction, by estimation, is 50%. Left ventricular ejection fraction by 3D volume is 53 %. The left ventricle has mildly decreased function. The left ventricle has no regional wall motion abnormalities. The left ventricular internal cavity size was normal in size. There is mild left ventricular hypertrophy. Left ventricular diastolic parameters are indeterminate. Right  Ventricle: The right ventricular size  is mildly enlarged. No increase in right ventricular wall thickness. Right ventricular systolic function is moderately reduced. There is moderately elevated pulmonary artery systolic pressure. The tricuspid regurgitant velocity is 3.05 m/s, and with an assumed right atrial pressure of 15 mmHg, the estimated right ventricular systolic pressure is 52.2 mmHg. Left Atrium: Left atrial size was severely dilated. Right Atrium: Right atrial size was moderately dilated. Pericardium: There is no evidence of pericardial effusion. Mitral Valve: Bileaflet mitral valve prolapse, posterior > anterior. Very eccentric, anteriorly directed jet of mitral regurgitation. P2 scallop prolapse/possible flail noted on 3D images. The mitral valve is myxomatous. Severe mitral valve regurgitation. No evidence of mitral valve stenosis. Tricuspid Valve: The tricuspid valve is normal in structure. Tricuspid valve regurgitation is moderate . No evidence of tricuspid stenosis. Aortic Valve: The aortic valve is tricuspid. There is mild thickening of the aortic valve. Aortic valve regurgitation is mild. Aortic regurgitation PHT measures 1091 msec. No aortic stenosis is present. Pulmonic Valve: The pulmonic valve was grossly normal. Pulmonic valve regurgitation is trivial. No evidence of pulmonic stenosis. Aorta: Aortic dilatation noted. There is borderline dilatation of the aortic root, measuring 39 mm. Venous: The inferior vena cava is dilated in size with less than 50% respiratory variability, suggesting right atrial pressure of 15 mmHg. IAS/Shunts: No atrial level shunt detected by color flow Doppler.  LEFT VENTRICLE PLAX 2D LVIDd:         4.70 cm LVIDs:         3.10 cm LV PW:         1.00 cm         3D Volume EF LV IVS:        1.10 cm         LV 3D EF:    Left LVOT diam:     1.90 cm                      ventricul LVOT Area:     2.84 cm                     ar                                              ejection                                             fraction LV Volumes (MOD)                            by 3D LV vol d, MOD    110.0 ml                   volume is A2C:                                        53 %. LV vol d, MOD    77.2 ml A4C: LV vol s, MOD    68.3 ml       3D Volume EF: A2C:  3D EF:        53 % LV vol s, MOD    43.9 ml A4C: LV SV MOD A2C:   41.7 ml LV SV MOD A4C:   77.2 ml LV SV MOD BP:    40.3 ml RIGHT VENTRICLE            IVC RV S prime:     4.79 cm/s  IVC diam: 2.40 cm TAPSE (M-mode): 1.0 cm LEFT ATRIUM              Index       RIGHT ATRIUM           Index LA diam:        4.80 cm  2.78 cm/m  RA Area:     24.20 cm LA Vol (A2C):   170.0 ml 98.29 ml/m RA Volume:   86.80 ml  50.18 ml/m LA Vol (A4C):   71.1 ml  41.11 ml/m LA Biplane Vol: 111.0 ml 64.18 ml/m  AORTIC VALVE AI PHT:      1091 msec  AORTA Ao Root diam: 3.90 cm Ao Asc diam:  3.00 cm MITRAL VALVE                 TRICUSPID VALVE MV Area (PHT): 5.27 cm      TR Peak grad:   37.2 mmHg MV Decel Time: 144 msec      TR Vmax:        305.00 cm/s MR Peak grad:    72.9 mmHg MR Mean grad:    43.0 mmHg   SHUNTS MR Vmax:         427.00 cm/s Systemic Diam: 1.90 cm MR Vmean:        308.0 cm/s MR PISA:         6.93 cm MR PISA Eff ROA: 62 mm MR PISA Radius:  1.05 cm MV E velocity: 143.67 cm/s Weston Brass MD Electronically signed by Weston Brass MD Signature Date/Time: 02/23/2021/5:18:37 PM    Final     Cardiac Studies   Relevant CV Studies:   Echo 01/31/20 1. Left ventricular ejection fraction, by estimation, is 50 to 55%. The  left ventricle has low normal function. The left ventricle has no regional  wall motion abnormalities. Left ventricular diastolic parameters are  indeterminate.   2. Right ventricular systolic function is moderately reduced. The right  ventricular size is normal. There is moderately elevated pulmonary artery  systolic pressure. The estimated right ventricular systolic pressure is   53.7 mmHg.   3. Left atrial size was severely dilated.   4. Right atrial size was moderately dilated.   5. The mitral valve is myxomatous. There is moderate holosystolic  prolapse of the medial scallop of the posterior leaflet of the mitral  valve. The anterior leaflet is mildly elongated. Normal mobility of the  mitral valve leaflets. Moderate mitral valve  regurgitation, with eccentric anteriorly directed jet. No evidence of  mitral valve stenosis.   6. The aortic valve is tricuspid. Aortic valve regurgitation is not  visualized. Mild aortic valve sclerosis is present, with no evidence of  aortic valve stenosis.   7. The inferior vena cava is dilated in size with >50% respiratory  variability, suggesting right atrial pressure of 8 mmHg.   8. Consider TEE for further evaluation of mital valve.   2D echo 02/23/2021 IMPRESSIONS    1. 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 Very eccentric,  anteriorly directed jet of mitral regurgitation. P2  scallop  prolapse/possible flail noted on 3D images.. The mitral valve is  myxomatous. Severe mitral valve regurgitation. No  evidence of mitral stenosis.   2. Left ventricular ejection fraction, by estimation, is 50%. Left  ventricular ejection fraction by 3D volume is 53 %. The left ventricle has  mildly decreased function. The left ventricle has no regional wall motion  abnormalities. There is mild left  ventricular hypertrophy. Left ventricular diastolic parameters are  indeterminate.   3. Right ventricular systolic function is moderately reduced. The right  ventricular size is mildly enlarged. There is moderately elevated  pulmonary artery systolic pressure. The estimated right ventricular  systolic pressure is 52.2 mmHg.   4. Left atrial size was severely dilated.   5. Right atrial size was moderately dilated.   6. Tricuspid valve regurgitation is moderate.   7. The aortic valve is tricuspid. There is mild thickening of  the aortic  valve. Aortic valve regurgitation is mild. No aortic stenosis is present.   8. Aortic dilatation noted. There is borderline dilatation of the aortic  root, measuring 39 mm.   9. The inferior vena cava is dilated in size with <50% respiratory  variability, suggesting right atrial pressure of 15 mmHg.   Conclusion(s)/Recommendation(s): Recommend TEE for further evaluation of  mitral valve prolapse.   Patient Profile     70 y.o. male with a hx of mitral regurgitation with prolapse, pulmonary hypertension, homelessness, paroxysmal atrial flutter and alcohol abuse who is being seen 02/23/2021 for the evaluation of atrial fibrillation at the request of Dr. Sharl Ma.  Assessment & Plan    Atrial fibrillation with rapid ventricular rate -Patient was found to be in atrial fibrillation in setting up hypoglycemia, hypotension, possible sepsis of unknown etiology (resolved) and adrenal insufficiency. -He has very poor living conditions with out food or water at the hotel he has been staying out for unknown length of time. -hospitalized in 2021 with atrial flutter and left AMA and never followed up>>at that time deemed not a candidate for anticoagulation due to his low CHADS2VASC score of 1 and poor social situation with high likelihood of not following up, noncompliance with meds and ETOH abuse -suspect that he has been in atrial flutter since 2021 -uncontrolled afib likely related to rebound from hypotension and has improved with IVF and with IV Amio -2D echo with low normal LVF and now severe MR and severe LAE likely driving ongoing atrial arrhythmias -BP too soft for addition of CCB or BB -continue IV Amio for now as HR is controlled in the 80's -will stop Eliquis and place on IV Heparin gtt for now while workup of severe MR in progress but again do not feel he is a long term candidate for anticoagulation.   -Could consider MAZE procedure if he is deemed a surgical candidate for MV repair or  consider Watchman but would still have to be anticoagulated for 4 weeks after device placement   CHA2DS2-VASc Score = 1  This indicates a 0.6% annual risk of stroke. 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 in setting of severe MR and also has  moderate TR with elevated RAP of -recommend TEE for further assessment and help to guide appropriate therapy -will make NPO after MN for TEE tomorrow -CVTS consult pending results of TEE -Shared Decision Making/Informed Consent The risks [esophageal damage, perforation (1:10,000 risk), bleeding, pharyngeal hematoma as well as other potential complications associated with conscious sedation including aspiration, arrhythmia, respiratory failure and death], benefits (treatment guidance and diagnostic support) and alternatives of a transesophageal echocardiogram were discussed in detail with Mr. Loeza and he is willing to proceed.     Adrenal insufficiency -Undergoing work-up per primary team   Alcohol abuse -On CIWA protocol    I have spent a total of 35 minutes with patient reviewing 2D echo , telemetry, EKGs, labs and examining patient as well as establishing an assessment and plan that was discussed with the patient.  > 50% of time was spent in direct patient care.     For questions or updates, please contact [facility] Please consult [email or web address] for contact info under        Signed, Armanda Magic, MD  02/24/2021, 10:02 AM

## 2021-02-24 NOTE — Progress Notes (Signed)
PROGRESS NOTE    Jaidon Ellery  XLK:[id number] DOB: 10-Aug-1950 DOA: 02/22/2021 PCP: Patient, No Pcp Per (Inactive)    Brief Narrative:  70 year old male with medical history of alcohol abuse, alcohol withdrawal seizures, atypical flutter, pulmonary hypertension presented to ED via EMS for evaluation of altered mental status.  Patient currently residing at hotel and was found to be hypoglycemic with CBG 37.  EMS reported poor living conditions, feces and urine bed, rotting food, multiple alcohol bottles.  Blood pressure was soft was given IV fluid bolus.  In the ED he was hypothermic with rectal temperature 95.2.  He was placed on [name]  Initially placed on empiric antibiotics for severe sepsis however no clear source of infection identified.  Due to persistent hypotension PCCM was consulted, found to have very low cortisol 2.1, cosyntropin stim test ordered for adrenal insufficiency.  He received IV fluid boluses, noted on pressor support.  Patient also found to be in A. fib with RVR.  Cardiology consulted.  Chest x-ray also showed progression fractures at C5-6-7   Assessment & Plan:   Principal Problem:   Hypotension Active Problems:   Atrial fibrillation with rapid ventricular response (HCC)   Hypomagnesemia   Hypoglycemia   Hypothermia   Protein-calorie malnutrition, severe  Toxic metabolic encephalopathy -Resolved -Mentally back to baseline -EEG showed moderate diffuse encephalopathy, no seizures or epileptiform discharges seen -CT head was unremarkable, showed chronic right maxillary sinus disease, air-fluid in the left frontal sinus may represent acute or chronic sinusitis -B12 2040, ammonia 22 -UDS noted to be neg   Hypotension/adrenal insufficiency -Likely due to adrenal insufficiency; cortisol level was 2.1 -Cortisone stimulation test has been ordered for tomorrow morning -Patient received 1 dose of Solu-Cortef 100 mg IV x1.  We will repeat the dose of Solu-Cortef 100 mg  x 1. -Also found to be in A. fib with RVR -No infectious source identified; chest x-ray showed no acute cardiopulmonary disease, lactic acid 1.5, procalcitonin less than 0.10, WBC 4.3, UA was clear -Was started on empiric antibiotics, PCCM was also consulted -Did not require pressor support   Thoracic compression fracture  -Initial chest x-ray chest x-ray showed progression fractures at C5-6-7 -Noted to have compression fractures at T5-T6 and T7 in thoracic spine which are stable from CT scan from 01/01/2020.  No evidence of progression   New onset atrial fibrillation with RVR -Started on apixaban 5 mg twice a day; CHA2DS2VASc score of 1 -Cardiology initially planned for TEE/DCCV, however given hx noncompliance and social issues, likely not candidate   Alcohol abuse -Started on CIWA protocol -Continue thiamine   Subclinical hypothyroidism -TSH 7.133, T4 1.21 -Will need repeat outpatient thyroid function test in 6 to 8 weeks   Hypomagnesemia -Cont to replace lytes as needed -repeat bmetin AM   Hyperbilirubinemia -Total bilirubin 3.5, improved to 3.1 this morning -Intact bilirubin is elevated at 1.9, direct bilirubin 1.2 -Unclear etiology, likely shock liver from hypotension -Recheck LFT's in AM   Hypokalemia -Replace potassium and check potassium level in a.m.   Sinusitis -Seen on CT head -on broad spec abx -Will transition to po augmentin   Alcohol abuse -No signs of alcohol withdrawal -Continue with CIWA protocol   Homeless issues -Social work consulted   Pancytopenia -Mild; likely from chronic alcohol abuse  Mitral regurg with prolapse  -Cardiology recommendations noted for TEE tomorrow with CT surgical consult pending results of TEE  DVT prophylaxis: Heparin drip Code Status: Full Family Communication: Pt in room, family not at  bedside  Status is: Inpatient  Remains inpatient appropriate because:Inpatient level of care appropriate due to severity of  illness  Dispo: The patient is from: Home              Anticipated d/c is to: Home              Patient currently is not medically stable to d/c.   Difficult to place patient No  Consultants:  PCCM Cardiology  Procedures:    Antimicrobials: Anti-infectives (From admission, onward)    Start     Dose/Rate Route Frequency Ordered Stop   02/24/21 1800  amoxicillin-clavulanate (AUGMENTIN) 875-125 MG per tablet 1 tablet        1 tablet Oral Every 12 hours 02/24/21 0949     02/24/21 0000  vancomycin (VANCOCIN) IVPB 1000 mg/200 mL premix  Status:  Discontinued        1,000 mg 200 mL/hr over 60 Minutes Intravenous Every 24 hours 02/23/21 0022 02/24/21 0949   02/23/21 0115  piperacillin-tazobactam (ZOSYN) IVPB 3.375 g  Status:  Discontinued        3.375 g 12.5 mL/hr over 240 Minutes Intravenous Every 8 hours 02/23/21 0015 02/24/21 0949   02/23/21 0115  vancomycin (VANCOCIN) IVPB 1000 mg/200 mL premix        1,000 mg 200 mL/hr over 60 Minutes Intravenous  Once 02/23/21 0015 02/23/21 0151       Subjective: Without complaints this morning  Objective: Vitals:   02/24/21 0900 02/24/21 1000 02/24/21 1100 02/24/21 1200  BP: 96/72  96/76 91/80  Pulse: (!) 103 (!) 103 (!) 102 98  Resp: 12 (!) 24 (!) 22   Temp:    (!) 97.5 F (36.4 C)  TempSrc:    Axillary  SpO2: 98% 98% 99%   Weight:      Height:        Intake/Output Summary (Last 24 hours) at 02/24/2021 1439 Last data filed at 02/24/2021 0600 Gross per 24 hour  Intake 594.6 ml  Output 300 ml  Net 294.6 ml   Filed Weights   02/22/21 2308  Weight: 53 kg    Examination: General exam: Awake, laying in bed, in nad Respiratory system: Normal respiratory effort, no wheezing Cardiovascular system: regular rate, s1, s2 Gastrointestinal system: Soft, nondistended, positive BS Central nervous system: CN2-12 grossly intact, strength intact Extremities: Perfused, no clubbing Skin: Normal skin turgor, no notable skin lesions  seen Psychiatry: Mood normal // no visual hallucinations   Data Reviewed: I have personally reviewed following labs and imaging studies  CBC: Recent Labs  Lab 02/22/21 1310 02/23/21 0937 02/24/21 0303  WBC 3.0* 4.3 2.3*  NEUTROABS 1.3*  --   --   HGB 10.3* 10.5* 9.8*  HCT 31.9* 32.5* 29.8*  MCV 88.9 87.8 86.1  PLT 92* 101* 97*   Basic Metabolic Panel: Recent Labs  Lab 02/22/21 1310 02/23/21 0047 02/23/21 0937 02/24/21 0303  NA 133*  --  133* 134*  K 4.5  --  3.2* 4.6  CL 97*  --  105 107  CO2 22  --  20* 20*  GLUCOSE 250*  --  114* 315*  BUN 5*  --  <5* <5*  CREATININE 0.83 0.71 0.69 0.86  CALCIUM 8.5*  --  8.7* 8.6*  MG  --  1.6*  --  2.0  PHOS  --  2.5  --   --    GFR: Estimated Creatinine Clearance: 62.5 mL/min (by C-G formula based on SCr of 0.86  mg/dL). Liver Function Tests: Recent Labs  Lab 02/22/21 1310 02/23/21 0937 02/24/21 0303  AST 31  --  22  ALT 16  --  8  ALKPHOS 75  --  78  BILITOT 3.5* 3.1* 2.0*  PROT 6.5  --  6.5  ALBUMIN 3.0*  --  2.9*   No results for input(s): LIPASE, AMYLASE in the last 168 hours. Recent Labs  Lab 02/22/21 1310  AMMONIA 22   Coagulation Profile: No results for input(s): INR, PROTIME in the last 168 hours. Cardiac Enzymes: No results for input(s): CKTOTAL, CKMB, CKMBINDEX, TROPONINI in the last 168 hours. BNP (last 3 results) No results for input(s): PROBNP in the last 8760 hours. HbA1C: No results for input(s): HGBA1C in the last 72 hours. CBG: Recent Labs  Lab 02/23/21 1542 02/23/21 1940 02/24/21 0021 02/24/21 0803 02/24/21 1217  GLUCAP 168* 286* 311* 222* 192*   Lipid Profile: No results for input(s): CHOL, HDL, LDLCALC, TRIG, CHOLHDL, LDLDIRECT in the last 72 hours. Thyroid Function Tests: Recent Labs    02/22/21 1310 02/22/21 1410  TSH 7.133*  --   FREET4  --  1.21*   Anemia Panel: Recent Labs    02/23/21 0937  VITAMINB12 2,040*  FOLATE 13.2  FERRITIN 328  TIBC 147*  IRON 40*   RETICCTPCT 0.8   Sepsis Labs: Recent Labs  Lab 02/22/21 1605 02/23/21 0047  PROCALCITON  --  <0.10  LATICACIDVEN 1.2 1.5    Recent Results (from the past 240 hour(s))  Resp Panel by RT-PCR (Flu A&B, Covid) Nasopharyngeal Swab     Status: None   Collection Time: 02/22/21  5:04 PM   Specimen: Nasopharyngeal Swab; Nasopharyngeal(NP) swabs in vial transport medium  Result Value Ref Range Status   SARS Coronavirus 2 by RT PCR NEGATIVE NEGATIVE Final    Comment: (NOTE) SARS-CoV-2 target nucleic acids are NOT DETECTED.  The SARS-CoV-2 RNA is generally detectable in upper respiratory specimens during the acute phase of infection. The lowest concentration of SARS-CoV-2 viral copies this assay can detect is 138 copies/mL. A negative result does not preclude SARS-Cov-2 infection and should not be used as the sole basis for treatment or other patient management decisions. A negative result may occur with  improper specimen collection/handling, submission of specimen other than nasopharyngeal swab, presence of viral mutation(s) within the areas targeted by this assay, and inadequate number of viral copies(<138 copies/mL). A negative result must be combined with clinical observations, patient history, and epidemiological information. The expected result is Negative.  Fact Sheet for Patients:  BloggerCourse.com  Fact Sheet for Healthcare Providers:  SeriousBroker.it  This test is no t yet approved or cleared by the [place] FDA and  has been authorized for detection and/or diagnosis of SARS-CoV-2 by FDA under an Emergency Use Authorization (EUA). This EUA will remain  in effect (meaning this test can be used) for the duration of the COVID-19 declaration under Section 564(b)(1) of the Act, 21 U.S.C.section 360bbb-3(b)(1), unless the authorization is terminated  or revoked sooner.       Influenza A by PCR NEGATIVE NEGATIVE Final    Influenza B by PCR NEGATIVE NEGATIVE Final    Comment: (NOTE) The Xpert Xpress SARS-CoV-2/FLU/RSV plus assay is intended as an aid in the diagnosis of influenza from Nasopharyngeal swab specimens and should not be used as a sole basis for treatment. Nasal washings and aspirates are unacceptable for Xpert Xpress SARS-CoV-2/FLU/RSV testing.  Fact Sheet for Patients: BloggerCourse.com  Fact Sheet for Healthcare Providers:  SeriousBroker.it  This test is not yet approved or cleared by the [place] and has been authorized for detection and/or diagnosis of SARS-CoV-2 by FDA under an Emergency Use Authorization (EUA). This EUA will remain in effect (meaning this test can be used) for the duration of the COVID-19 declaration under Section 564(b)(1) of the Act, 21 U.S.C. section 360bbb-3(b)(1), unless the authorization is terminated or revoked.  Performed at [facility], [address], [place], [place] [address]   MRSA Next Gen by PCR, Nasal     Status: None   Collection Time: 02/22/21 11:27 PM   Specimen: Nasal Mucosa; Nasal Swab  Result Value Ref Range Status   MRSA by PCR Next Gen NOT DETECTED NOT DETECTED Final    Comment: (NOTE) The GeneXpert MRSA Assay (FDA approved for NASAL specimens only), is one component of a comprehensive MRSA colonization surveillance program. It is not intended to diagnose MRSA infection nor to guide or monitor treatment for MRSA infections. Test performance is not FDA approved in patients less than 40 years old. Performed at [facility], [address] [place], [place] [address]   Culture, blood (routine x 2)     Status: None (Preliminary result)   Collection Time: 02/23/21 12:47 AM   Specimen: BLOOD  Result Value Ref Range Status   Specimen Description   Final    BLOOD BLOOD RIGHT HAND Performed at [facility], [address], [place], [place] [address]    Special Requests   Final    BOTTLES DRAWN AEROBIC ONLY Blood Culture adequate volume Performed at [facility], [address] [place], [place] [address]    Culture   Final    NO GROWTH 1 DAY Performed at [facility], [address] [place], [place] [address]    Report Status PENDING  Incomplete  Culture, blood (routine x 2)     Status: None (Preliminary result)   Collection Time: 02/23/21 12:47 AM   Specimen: BLOOD  Result Value Ref Range Status   Specimen Description   Final    BLOOD BLOOD LEFT HAND Performed at [facility], [address] [place], [place] [address]    Special Requests   Final    BOTTLES DRAWN AEROBIC ONLY Blood Culture adequate volume Performed at [facility], [address] [place], [place] [address]    Culture   Final    NO GROWTH 1 DAY Performed at [facility], [address] [place], [place] [address]    Report Status PENDING  Incomplete     Radiology Studies: CT HEAD WO CONTRAST ( )  Result Date: 02/22/2021 CLINICAL DATA:  Delirium EXAM: CT HEAD WITHOUT CONTRAST TECHNIQUE: Contiguous axial images were obtained from the base of the skull through the vertex without intravenous contrast. COMPARISON:  01/31/2020 FINDINGS: Brain: No evidence of acute infarction, hemorrhage, hydrocephalus, extra-axial collection or mass lesion/mass effect. Mild low-density changes within the periventricular and subcortical white matter compatible with chronic microvascular ischemic change. Mild diffuse cerebral volume loss. Vascular: Atherosclerotic calcifications involving the large vessels of the skull base. No unexpected hyperdense vessel. Skull: Normal. Negative for fracture or focal lesion. Sinuses/Orbits: Chronic complete right maxillary sinus opacification with associated bony thickening of the maxillary sinus walls. Mucosal thickening within the bilateral ethmoid  air cells and bilateral frontal sinuses. Air-fluid level in the left frontal sinus. Other: None. IMPRESSION: 1. No acute intracranial findings. 2. Mild chronic  microvascular ischemic change and cerebral volume loss. 3. Chronic right maxillary sinus disease. Air-fluid level in the left frontal sinus may represent acute on chronic sinusitis. Electronically Signed   By: Duanne Guess D.O.   On: 02/22/2021 16:02   EEG adult  Result Date: 02/23/2021 Charlsie Quest, MD     02/23/2021 12:51 PM Patient Name: Roy Ryan MRN: [id number] Epilepsy Attending: Charlsie Quest Referring Physician/Provider: Dr John Giovanni, Date: 02/23/2021 Duration: 21.03 mins Patient history: 70 year old male with altered mental status.  EEG to evaluate for seizures. Level of alertness: Awake AEDs during EEG study: None Technical aspects: This EEG study was done with scalp electrodes positioned according to the 10-20 International system of electrode placement. Electrical activity was acquired at a sampling rate of  and reviewed with a high frequency filter of  and a low frequency filter of . EEG data were recorded continuously and digitally stored. Description: No clear posterior dominant rhythm was seen.  EEG showed continuous generalized 3 to 5 Hz theta-delta slowing. Hyperventilation and photic stimulation were not performed.   ABNORMALITY - Continuous slow, generalized IMPRESSION: This study is suggestive of moderate diffuse encephalopathy, nonspecific etiology. No seizures or epileptiform discharges were seen throughout the recording. Charlsie Quest   ECHOCARDIOGRAM COMPLETE  Result Date: 02/23/2021    ECHOCARDIOGRAM REPORT   Patient Name:   GEAN LAROSE Date of Exam: 02/23/2021 Medical Rec #:  [id number]    Height:       74.0 in Accession #:    [id number]   Weight:       116.8 lb Date of Birth:  [birth year]     BSA:          1.730 m Patient Age:    69 years     BP:           91/63 mmHg Patient Gender: M             HR:           88 bpm. Exam Location:  Inpatient Procedure: 2D Echo, 3D Echo, Cardiac Doppler and Color Doppler Indications:    I48.91* Unspeicified atrial fibrillation  History:        Patient has prior history of Echocardiogram examinations, most                 recent 01/31/2020. CHF, Abnormal ECG, Mitral Valve Prolapse and                 Mitral Valve Disease, Arrythmias:Atrial Fibrillation;                 Signs/Symptoms:Hypotension and Chest Pain. ETOH.  Sonographer:    Sheralyn Boatman RDCS Referring Phys: [id number] VASUNDHRA RATHORE IMPRESSIONS  1. 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 Very eccentric, anteriorly directed jet of mitral regurgitation. P2 scallop prolapse/possible flail noted on 3D images.. The mitral valve is myxomatous. Severe mitral valve regurgitation. No evidence of mitral stenosis.  2. Left ventricular ejection fraction, by estimation, is 50%. Left ventricular ejection fraction by 3D volume is 53 %. The left ventricle has mildly decreased function. The left ventricle has no regional wall motion abnormalities. There is mild left ventricular hypertrophy. Left ventricular diastolic parameters are indeterminate.  3. Right ventricular systolic function is moderately reduced. The right ventricular size is mildly enlarged. There is moderately elevated pulmonary artery systolic pressure. The estimated right ventricular systolic pressure is 52.2 mmHg.  4. Left atrial size was severely dilated.  5. Right atrial size was  moderately dilated.  6. Tricuspid valve regurgitation is moderate.  7. The aortic valve is tricuspid. There is mild thickening of the aortic valve. Aortic valve regurgitation is mild. No aortic stenosis is present.  8. Aortic dilatation noted. There is borderline dilatation of the aortic root, measuring 39 mm.  9. The inferior vena cava is dilated in size with <50% respiratory variability, suggesting right atrial pressure of 15 mmHg. Conclusion(s)/Recommendation(s):  Recommend TEE for further evaluation of mitral valve prolapse. FINDINGS  Left Ventricle: Left ventricular ejection fraction, by estimation, is 50%. Left ventricular ejection fraction by 3D volume is 53 %. The left ventricle has mildly decreased function. The left ventricle has no regional wall motion abnormalities. The left ventricular internal cavity size was normal in size. There is mild left ventricular hypertrophy. Left ventricular diastolic parameters are indeterminate. Right Ventricle: The right ventricular size is mildly enlarged. No increase in right ventricular wall thickness. Right ventricular systolic function is moderately reduced. There is moderately elevated pulmonary artery systolic pressure. The tricuspid regurgitant velocity is 3.05 m/s, and with an assumed right atrial pressure of 15 mmHg, the estimated right ventricular systolic pressure is 52.2 mmHg. Left Atrium: Left atrial size was severely dilated. Right Atrium: Right atrial size was moderately dilated. Pericardium: There is no evidence of pericardial effusion. Mitral Valve: Bileaflet mitral valve prolapse, posterior > anterior. Very eccentric, anteriorly directed jet of mitral regurgitation. P2 scallop prolapse/possible flail noted on 3D images. The mitral valve is myxomatous. Severe mitral valve regurgitation. No evidence of mitral valve stenosis. Tricuspid Valve: The tricuspid valve is normal in structure. Tricuspid valve regurgitation is moderate . No evidence of tricuspid stenosis. Aortic Valve: The aortic valve is tricuspid. There is mild thickening of the aortic valve. Aortic valve regurgitation is mild. Aortic regurgitation PHT measures 1091 msec. No aortic stenosis is present. Pulmonic Valve: The pulmonic valve was grossly normal. Pulmonic valve regurgitation is trivial. No evidence of pulmonic stenosis. Aorta: Aortic dilatation noted. There is borderline dilatation of the aortic root, measuring 39 mm. Venous: The inferior vena cava  is dilated in size with less than 50% respiratory variability, suggesting right atrial pressure of 15 mmHg. IAS/Shunts: No atrial level shunt detected by color flow Doppler.  LEFT VENTRICLE PLAX 2D LVIDd:         4.70 cm LVIDs:         3.10 cm LV PW:         1.00 cm         3D Volume EF LV IVS:        1.10 cm         LV 3D EF:    Left LVOT diam:     1.90 cm                      ventricul LVOT Area:     2.84 cm                     ar                                             ejection  fraction LV Volumes (MOD)                            by 3D LV vol d, MOD    110.0 ml                   volume is A2C:                                        53 %. LV vol d, MOD    77.2 ml A4C: LV vol s, MOD    68.3 ml       3D Volume EF: A2C:                           3D EF:        53 % LV vol s, MOD    43.9 ml A4C: LV SV MOD A2C:   41.7 ml LV SV MOD A4C:   77.2 ml LV SV MOD BP:    40.3 ml RIGHT VENTRICLE            IVC RV S prime:     4.79 cm/s  IVC diam: 2.40 cm TAPSE (M-mode): 1.0 cm LEFT ATRIUM              Index       RIGHT ATRIUM           Index LA diam:        4.80 cm  2.78 cm/m  RA Area:     24.20 cm LA Vol (A2C):   170.0 ml 98.29 ml/m RA Volume:   86.80 ml  50.18 ml/m LA Vol (A4C):   71.1 ml  41.11 ml/m LA Biplane Vol: 111.0 ml 64.18 ml/m  AORTIC VALVE AI PHT:      1091 msec  AORTA Ao Root diam: 3.90 cm Ao Asc diam:  3.00 cm MITRAL VALVE                 TRICUSPID VALVE MV Area (PHT): 5.27 cm      TR Peak grad:   37.2 mmHg MV Decel Time: 144 msec      TR Vmax:        305.00 cm/s MR Peak grad:    72.9 mmHg MR Mean grad:    43.0 mmHg   SHUNTS MR Vmax:         427.00 cm/s Systemic Diam: 1.90 cm MR Vmean:        308.0 cm/s MR PISA:         6.93 cm MR PISA Eff ROA: 62 mm MR PISA Radius:  1.05 cm MV E velocity: 143.67 cm/s Weston Brass MD Electronically signed by Weston Brass MD Signature Date/Time: 02/23/2021/5:18:37 PM    Final     Scheduled Meds:  (feeding supplement)  PROSource Plus  30 mL Oral BID BM   amoxicillin-clavulanate  1 tablet Oral Q12H   Chlorhexidine Gluconate Cloth  6 each Topical Daily   ciprofloxacin  1 drop Right Eye Q4H while awake   feeding supplement  1 Container Oral BID BM   feeding supplement  237 mL Oral Q24H   folic acid  1 mg Oral Daily   multivitamin with minerals  1 tablet Oral Daily   pantoprazole  40 mg Oral Daily  pneumococcal 23 valent vaccine  0.5 mL Intramuscular Tomorrow-1000   thiamine  100 mg Oral Daily   Or   thiamine  100 mg Intravenous Daily   Continuous Infusions:  sodium chloride 100 mL/hr at 02/24/21 0935   sodium chloride     amiodarone 30 mg/hr (02/23/21 1820)   heparin     sodium chloride       LOS: 2 days   Rickey BarbaraStephen Aedyn Kempfer, MD [facility] Pager On Amion  If 7PM-7AM, please contact night-coverage 02/24/2021, 2:39 PM

## 2021-02-24 NOTE — Progress Notes (Signed)
Transport with [facility] set up to have pt to [facility] for scheduled TEE 02/25/21. Pt's RN, [place] Sink, made aware and will make sure that pt's signed consent form is sent with pt. Weston Settle, RN

## 2021-02-25 ENCOUNTER — Inpatient Hospital Stay (HOSPITAL_COMMUNITY): Payer: Medicare Other

## 2021-02-25 ENCOUNTER — Inpatient Hospital Stay (HOSPITAL_COMMUNITY): Payer: Medicare Other | Admitting: Anesthesiology

## 2021-02-25 ENCOUNTER — Encounter (HOSPITAL_COMMUNITY)
Admission: EM | Disposition: A | Discharge status: Skilled Nursing Facility | Payer: Self-pay | Source: Home / Self Care | Attending: Internal Medicine

## 2021-02-25 ENCOUNTER — Encounter (HOSPITAL_COMMUNITY): Payer: Self-pay | Admitting: Internal Medicine

## 2021-02-25 DIAGNOSIS — I951 Orthostatic hypotension: Secondary | ICD-10-CM | POA: Diagnosis not present

## 2021-02-25 DIAGNOSIS — I340 Nonrheumatic mitral (valve) insufficiency: Secondary | ICD-10-CM | POA: Diagnosis not present

## 2021-02-25 DIAGNOSIS — I4891 Unspecified atrial fibrillation: Secondary | ICD-10-CM | POA: Diagnosis not present

## 2021-02-25 DIAGNOSIS — I341 Nonrheumatic mitral (valve) prolapse: Secondary | ICD-10-CM

## 2021-02-25 DIAGNOSIS — E162 Hypoglycemia, unspecified: Secondary | ICD-10-CM | POA: Diagnosis not present

## 2021-02-25 HISTORY — PX: TEE WITHOUT CARDIOVERSION: SHX5443

## 2021-02-25 LAB — CBC
HCT: 28 % — ABNORMAL LOW (ref 39.0–52.0)
Hemoglobin: 9.3 g/dL — ABNORMAL LOW (ref 13.0–17.0)
MCH: 29.1 pg (ref 26.0–34.0)
MCHC: 33.2 g/dL (ref 30.0–36.0)
MCV: 87.5 fL (ref 80.0–100.0)
Platelets: 97 10*3/uL — ABNORMAL LOW (ref 150–400)
RBC: 3.2 MIL/uL — ABNORMAL LOW (ref 4.22–5.81)
RDW: 16.9 % — ABNORMAL HIGH (ref 11.5–15.5)
WBC: 4.7 10*3/uL (ref 4.0–10.5)
nRBC: 0.6 % — ABNORMAL HIGH (ref 0.0–0.2)

## 2021-02-25 LAB — GLUCOSE, CAPILLARY
Glucose-Capillary: 109 mg/dL — ABNORMAL HIGH (ref 70–99)
Glucose-Capillary: 97 mg/dL (ref 70–99)
Glucose-Capillary: 75 mg/dL (ref 70–99)
Glucose-Capillary: 95 mg/dL (ref 70–99)

## 2021-02-25 LAB — COMPREHENSIVE METABOLIC PANEL
ALT: 13 U/L (ref 0–44)
AST: 47 U/L — ABNORMAL HIGH (ref 15–41)
Albumin: 2.9 g/dL — ABNORMAL LOW (ref 3.5–5.0)
Alkaline Phosphatase: 67 U/L (ref 38–126)
Anion gap: 8 (ref 5–15)
BUN: 6 mg/dL — ABNORMAL LOW (ref 8–23)
CO2: 19 mmol/L — ABNORMAL LOW (ref 22–32)
Calcium: 8.7 mg/dL — ABNORMAL LOW (ref 8.9–10.3)
Chloride: 108 mmol/L (ref 98–111)
Creatinine, Ser: 0.92 mg/dL (ref 0.61–1.24)
GFR, Estimated: >60 mL/min (ref >60)
Glucose, Bld: 156 mg/dL — ABNORMAL HIGH (ref 70–99)
Potassium: 4.2 mmol/L (ref 3.5–5.1)
Sodium: 135 mmol/L (ref 135–145)
Total Bilirubin: 1.5 mg/dL — ABNORMAL HIGH (ref 0.3–1.2)
Total Protein: 6.2 g/dL — ABNORMAL LOW (ref 6.5–8.1)

## 2021-02-25 LAB — ECHO TEE
MV M vel: 4.26 m/s
MV Peak grad: 72.6 mmHg
MV Vena cont: 0.57 cm
Radius: 1.68 cm
S' Lateral: 3.1 cm
Single Plane A4C EF: 50.8 %

## 2021-02-25 LAB — MAGNESIUM: Magnesium: 1.7 mg/dL (ref 1.7–2.4)

## 2021-02-25 LAB — APTT: aPTT: 136 seconds — ABNORMAL HIGH (ref 24–36)

## 2021-02-25 SURGERY — ECHOCARDIOGRAM, TRANSESOPHAGEAL
Anesthesia: Monitor Anesthesia Care

## 2021-02-25 MED ORDER — AMIODARONE HCL 200 MG PO TABS
200.0000 mg | ORAL_TABLET | Freq: Two times a day (BID) | ORAL | Status: DC
  Administered 2021-02-25 – 2021-03-14 (×30): 200 mg via ORAL
  Filled 2021-02-25 (×32): qty 1

## 2021-02-25 MED ORDER — HEPARIN SODIUM (PORCINE) 5000 UNIT/ML IJ SOLN
5000.0000 [IU] | Freq: Three times a day (TID) | INTRAMUSCULAR | Status: DC
  Administered 2021-02-25 – 2021-03-12 (×45): 5000 [IU] via SUBCUTANEOUS
  Filled 2021-02-25 (×43): qty 1

## 2021-02-25 MED ORDER — ONDANSETRON HCL 4 MG/2ML IJ SOLN
INTRAMUSCULAR | Status: DC | PRN
  Administered 2021-02-25: 4 mg via INTRAVENOUS

## 2021-02-25 MED ORDER — CALCIUM CHLORIDE 10 % IV SOLN
INTRAVENOUS | Status: DC | PRN
  Administered 2021-02-25: 1 g via INTRAVENOUS

## 2021-02-25 MED ORDER — HEPARIN (PORCINE) 25000 UT/250ML-% IV SOLN: 650.0000 [IU]/h | INTRAVENOUS | Status: DC

## 2021-02-25 MED ORDER — PHENYLEPHRINE HCL-NACL 20-0.9 MG/250ML-% IV SOLN
INTRAVENOUS | Status: DC | PRN
  Administered 2021-02-25: 40 ug/min via INTRAVENOUS

## 2021-02-25 MED ORDER — PROPOFOL 10 MG/ML IV BOLUS
INTRAVENOUS | Status: DC | PRN
  Administered 2021-02-25: 20 mg via INTRAVENOUS

## 2021-02-25 MED ORDER — BUTAMBEN-TETRACAINE-BENZOCAINE 2-2-14 % EX AERO
INHALATION_SPRAY | CUTANEOUS | Status: DC | PRN
  Administered 2021-02-25: 1 via TOPICAL

## 2021-02-25 MED ORDER — MIDAZOLAM HCL 2 MG/2ML IJ SOLN
INTRAMUSCULAR | Status: DC | PRN
  Administered 2021-02-25: 2 mg via INTRAVENOUS

## 2021-02-25 MED ORDER — ALBUMIN HUMAN 5 % IV SOLN: INTRAVENOUS | Status: DC | PRN

## 2021-02-25 MED ORDER — LIDOCAINE 2% (20 MG/ML) 5 ML SYRINGE
INTRAMUSCULAR | Status: DC | PRN
  Administered 2021-02-25: 40 mg via INTRAVENOUS

## 2021-02-25 MED ORDER — PROPOFOL 500 MG/50ML IV EMUL
INTRAVENOUS | Status: DC | PRN
  Administered 2021-02-25: 80 ug/kg/min via INTRAVENOUS

## 2021-02-25 NOTE — Progress Notes (Signed)
PROGRESS NOTE    Roy Ryan  ZOX:[id number] DOB: Oct 27, 1950 DOA: 02/22/2021 PCP: Patient, No Pcp Per (Inactive)    Brief Narrative:  70 year old male with medical history of alcohol abuse, alcohol withdrawal seizures, atypical flutter, pulmonary hypertension presented to ED via EMS for evaluation of altered mental status.  Patient currently residing at hotel and was found to be hypoglycemic with CBG 37.  EMS reported poor living conditions, feces and urine bed, rotting food, multiple alcohol bottles.  Blood pressure was soft was given IV fluid bolus.  In the ED he was hypothermic with rectal temperature 95.2.  He was placed on [name]  Initially placed on empiric antibiotics for severe sepsis however no clear source of infection identified.  Due to persistent hypotension PCCM was consulted, found to have very low cortisol 2.1, cosyntropin stim test ordered for adrenal insufficiency.  He received IV fluid boluses, noted on pressor support.  Patient also found to be in A. fib with RVR.  Cardiology consulted.  Chest x-ray also showed progression fractures at C5-6-7   Assessment & Plan:   Principal Problem:   Hypotension Active Problems:   Atrial fibrillation with rapid ventricular response (HCC)   Hypomagnesemia   Hypoglycemia   Hypothermia   Protein-calorie malnutrition, severe   Nonrheumatic mitral valve regurgitation  Toxic metabolic encephalopathy -Noted to be back to baseline recently -EEG showed moderate diffuse encephalopathy, no seizures or epileptiform discharges seen -CT head was unremarkable, showed chronic right maxillary sinus disease, air-fluid in the left frontal sinus may represent acute or chronic sinusitis -B12 2040, ammonia 22 -UDS noted to be neg   Hypotension/adrenal insufficiency -Likely due to adrenal insufficiency; cortisol level was 2.1 -Cortisone stimulation test was ordered, pending -Patient received 1 dose of Solu-Cortef 100 mg IV x2 -Also found to be  in A. fib with RVR -No infectious source identified; chest x-ray showed no acute cardiopulmonary disease, lactic acid 1.5, procalcitonin less than 0.10, WBC 4.3, UA was clear -Was started on empiric antibiotics, PCCM was also consulted -Did not require pressor support, bp currently stable   Thoracic compression fracture  -Initial chest x-ray chest x-ray showed progression fractures at C5-6-7 -Noted to have compression fractures at T5-T6 and T7 in thoracic spine which are stable from CT scan from 01/01/2020.  Without evidence of progression   New onset atrial fibrillation with RVR -Started on apixaban 5 mg twice a day; CHA2DS2VASc score of 1 -Cardiology initially planned for TEE/DCCV, however given hx noncompliance and social issues, likely not candidate   Alcohol abuse -Had been continued on CIWA protocol -Continue thiamine   Subclinical hypothyroidism -TSH 7.133, T4 1.21 -Will need repeat outpatient thyroid function test in 6 to 8 weeks   Hypomagnesemia -Cont to replace lytes as needed -repeat bmetin AM   Hyperbilirubinemia -Total bilirubin 3.5, improved to 3.1 this morning -Intact bilirubin is elevated at 1.9, direct bilirubin 1.2 -Unclear etiology, likely shock liver from hypotension -Recheck LFT's in AM   Hypokalemia -Continue to replace lytes as needed   Sinusitis -Seen on CT head -Now on Augmentin   Alcohol abuse -No signs of alcohol withdrawal -Continue with CIWA protocol   Homeless issues -Social work consulted   Pancytopenia -Mild; likely from chronic alcohol abuse  Mitral regurg with prolapse  -Pt is now s/p TEE with evidence of severe MR with a component of acute MR likely present. Noted to be a poor candidate for surgical repair -Cardiology has recommended f/u with Palliative Care to establish goals of care -Palliative  Care consulted  DVT prophylaxis: Heparin subq Code Status: Full Family Communication: Pt in room, family not at bedside  Status is:  Inpatient  Remains inpatient appropriate because:Inpatient level of care appropriate due to severity of illness  Dispo: The patient is from: Home              Anticipated d/c is to: Home              Patient currently is not medically stable to d/c.   Difficult to place patient No  Consultants:  PCCM Cardiology Palliative Care  Procedures:    Antimicrobials: Anti-infectives (From admission, onward)    Start     Dose/Rate Route Frequency Ordered Stop   02/24/21 1800  amoxicillin-clavulanate (AUGMENTIN) 875-125 MG per tablet 1 tablet        1 tablet Oral Every 12 hours 02/24/21 0949     02/24/21 0000  vancomycin (VANCOCIN) IVPB 1000 mg/200 mL premix  Status:  Discontinued        1,000 mg 200 mL/hr over 60 Minutes Intravenous Every 24 hours 02/23/21 0022 02/24/21 0949   02/23/21 0115  piperacillin-tazobactam (ZOSYN) IVPB 3.375 g  Status:  Discontinued        3.375 g 12.5 mL/hr over 240 Minutes Intravenous Every 8 hours 02/23/21 0015 02/24/21 0949   02/23/21 0115  vancomycin (VANCOCIN) IVPB 1000 mg/200 mL premix        1,000 mg 200 mL/hr over 60 Minutes Intravenous  Once 02/23/21 0015 02/23/21 0151       Subjective: No complaints this afternoon  Objective: Vitals:   02/25/21 1400 02/25/21 1500 02/25/21 1613 02/25/21 1615  BP: (!) 89/72 98/69  91/71  Pulse:    80  Resp: (!) Temp:    (!) 96.7 F (35.9 C)  TempSrc:   Rectal   SpO2: 99% 99%  93%  Weight:      Height:        Intake/Output Summary (Last 24 hours) at 02/25/2021 1821 Last data filed at 02/25/2021 1141 Gross per 24 hour  Intake 1997.37 ml  Output 100 ml  Net 1897.37 ml    Filed Weights   02/22/21 2308  Weight: 53 kg    Examination: General exam: Conversant, in no acute distress Respiratory system: normal chest rise, clear, no audible wheezing Cardiovascular system: regular rhythm, s1-s2 Gastrointestinal system: Nondistended, nontender, pos BS Central nervous system: No seizures, no  tremors Extremities: No cyanosis, no joint deformities Skin: No rashes, no pallor Psychiatry: Affect normal // no auditory hallucinations   Data Reviewed: I have personally reviewed following labs and imaging studies  CBC: Recent Labs  Lab 02/22/21 1310 02/23/21 0937 02/24/21 0303 02/25/21 0357  WBC 3.0* 4.3 2.3* 4.7  NEUTROABS 1.3*  --   --   --   HGB 10.3* 10.5* 9.8* 9.3*  HCT 31.9* 32.5* 29.8* 28.0*  MCV 88.9 87.8 86.1 87.5  PLT 92* 101* 97* 97*    Basic Metabolic Panel: Recent Labs  Lab 02/22/21 1310 02/23/21 0047 02/23/21 0937 02/24/21 0303 02/25/21 0357  NA 133*  --  133* 134* 135  K 4.5  --  3.2* 4.6 4.2  CL 97*  --  105 107 108  CO2 22  --  20* 20* 19*  GLUCOSE 250*  --  114* 315* 156*  BUN 5*  --  <5* <5* 6*  CREATININE 0.83 0.71 0.69 0.86 0.92  CALCIUM 8.5*  --  8.7* 8.6* 8.7*  MG  --  1.6*  --  2.0  --   PHOS  --  2.5  --   --   --     GFR: Estimated Creatinine Clearance: 58.4 mL/min (by C-G formula based on SCr of 0.92 mg/dL). Liver Function Tests: Recent Labs  Lab 02/22/21 1310 02/23/21 0937 02/24/21 0303 02/25/21 0357  AST 31  --  22 47*  ALT 16  --  8 13  ALKPHOS 75  --  78 67  BILITOT 3.5* 3.1* 2.0* 1.5*  PROT 6.5  --  6.5 6.2*  ALBUMIN 3.0*  --  2.9* 2.9*    No results for input(s): LIPASE, AMYLASE in the last 168 hours. Recent Labs  Lab 02/22/21 1310  AMMONIA 22    Coagulation Profile: No results for input(s): INR, PROTIME in the last 168 hours. Cardiac Enzymes: No results for input(s): CKTOTAL, CKMB, CKMBINDEX, TROPONINI in the last 168 hours. BNP (last 3 results) No results for input(s): PROBNP in the last 8760 hours. HbA1C: No results for input(s): HGBA1C in the last 72 hours. CBG: Recent Labs  Lab 02/24/21 1951 02/24/21 2259 02/25/21 0312 02/25/21 1152 02/25/21 1648  GLUCAP 171* 179* 109* 97 75    Lipid Profile: No results for input(s): CHOL, HDL, LDLCALC, TRIG, CHOLHDL, LDLDIRECT in the last 72  hours. Thyroid Function Tests: No results for input(s): TSH, T4TOTAL, FREET4, T3FREE, THYROIDAB in the last 72 hours.  Anemia Panel: Recent Labs    02/23/21 0937  VITAMINB12 2,040*  FOLATE 13.2  FERRITIN 328  TIBC 147*  IRON 40*  RETICCTPCT 0.8    Sepsis Labs: Recent Labs  Lab 02/22/21 1605 02/23/21 0047  PROCALCITON  --  <0.10  LATICACIDVEN 1.2 1.5     Recent Results (from the past 240 hour(s))  Resp Panel by RT-PCR (Flu A&B, Covid) Nasopharyngeal Swab     Status: None   Collection Time: 02/22/21  5:04 PM   Specimen: Nasopharyngeal Swab; Nasopharyngeal(NP) swabs in vial transport medium  Result Value Ref Range Status   SARS Coronavirus 2 by RT PCR NEGATIVE NEGATIVE Final    Comment: (NOTE) SARS-CoV-2 target nucleic acids are NOT DETECTED.  The SARS-CoV-2 RNA is generally detectable in upper respiratory specimens during the acute phase of infection. The lowest concentration of SARS-CoV-2 viral copies this assay can detect is 138 copies/mL. A negative result does not preclude SARS-Cov-2 infection and should not be used as the sole basis for treatment or other patient management decisions. A negative result may occur with  improper specimen collection/handling, submission of specimen other than nasopharyngeal swab, presence of viral mutation(s) within the areas targeted by this assay, and inadequate number of viral copies(<138 copies/mL). A negative result must be combined with clinical observations, patient history, and epidemiological information. The expected result is Negative.  Fact Sheet for Patients:  BloggerCourse.com  Fact Sheet for Healthcare Providers:  SeriousBroker.it  This test is no t yet approved or cleared by the [place] FDA and  has been authorized for detection and/or diagnosis of SARS-CoV-2 by FDA under an Emergency Use Authorization (EUA). This EUA will remain  in effect (meaning this  test can be used) for the duration of the COVID-19 declaration under Section 564(b)(1) of the Act, 21 U.S.C.section 360bbb-3(b)(1), unless the authorization is terminated  or revoked sooner.       Influenza A by PCR NEGATIVE NEGATIVE Final   Influenza B by PCR NEGATIVE NEGATIVE Final    Comment: (NOTE) The Xpert Xpress SARS-CoV-2/FLU/RSV plus assay is intended as  an aid in the diagnosis of influenza from Nasopharyngeal swab specimens and should not be used as a sole basis for treatment. Nasal washings and aspirates are unacceptable for Xpert Xpress SARS-CoV-2/FLU/RSV testing.  Fact Sheet for Patients: BloggerCourse.comhttps://www.fda.gov/media/152166/download  Fact Sheet for Healthcare Providers: SeriousBroker.ithttps://www.fda.gov/media/152162/download  This test is not yet approved or cleared by the [place] FDA and has been authorized for detection and/or diagnosis of SARS-CoV-2 by FDA under an Emergency Use Authorization (EUA). This EUA will remain in effect (meaning this test can be used) for the duration of the COVID-19 declaration under Section 564(b)(1) of the Act, 21 U.S.C. section 360bbb-3(b)(1), unless the authorization is terminated or revoked.  Performed at [facility], [address] [place], [place] [address]   MRSA Next Gen by PCR, Nasal     Status: None   Collection Time: 02/22/21 11:27 PM   Specimen: Nasal Mucosa; Nasal Swab  Result Value Ref Range Status   MRSA by PCR Next Gen NOT DETECTED NOT DETECTED Final    Comment: (NOTE) The GeneXpert MRSA Assay (FDA approved for NASAL specimens only), is one component of a comprehensive MRSA colonization surveillance program. It is not intended to diagnose MRSA infection nor to guide or monitor treatment for MRSA infections. Test performance is not FDA approved in patients less than 68 years old. Performed at [facility], [address] [place], [place] [address]   Culture, blood (routine x 2)      Status: None (Preliminary result)   Collection Time: 02/23/21 12:47 AM   Specimen: BLOOD  Result Value Ref Range Status   Specimen Description   Final    BLOOD BLOOD RIGHT HAND Performed at [facility], [address] [place], [place] [address]    Special Requests   Final    BOTTLES DRAWN AEROBIC ONLY Blood Culture adequate volume Performed at [facility], [address] [place], [place] [address]    Culture   Final    NO GROWTH 2 DAYS Performed at [facility], [address] [place], [place] [address]    Report Status PENDING  Incomplete  Culture, blood (routine x 2)     Status: None (Preliminary result)   Collection Time: 02/23/21 12:47 AM   Specimen: BLOOD  Result Value Ref Range Status   Specimen Description   Final    BLOOD BLOOD LEFT HAND Performed at [facility], [address] [place], [place] [address]    Special Requests   Final    BOTTLES DRAWN AEROBIC ONLY Blood Culture adequate volume Performed at [facility], [address] [place], [place] [address]    Culture   Final    NO GROWTH 2 DAYS Performed at [facility], [address] [place], [place] [address]    Report Status PENDING  Incomplete      Radiology Studies: ECHO TEE  Result Date: 02/25/2021    TRANSESOPHOGEAL ECHO REPORT   Patient Name:   Arnetha CourserWILLIAM Twombly Date of Exam: 02/25/2021 Medical Rec #:  [id number]    Height:       74.0 in Accession #:    [id number]   Weight:       116.8 lb Date of Birth:  01/12/1951     BSA:          1.730 m Patient Age:    69 years     BP:  91/61 mmHg Patient Gender: M            HR:           110 bpm. Exam Location:  Inpatient Procedure: Transesophageal Echo, 3D Echo, Color Doppler and Cardiac Doppler Indications:     I34.1 Nonrheumatic mitral (valve) prolapse  History:         Patient has prior history of Echocardiogram examinations, most                  recent 02/23/2021.  Arrythmias:Atrial Fibrillation; Risk                  Factors:ETOH Abuse.  Sonographer:     Irving Burton Senior RDCS Referring Phys:  [id number] Corrin Parker Diagnosing Phys: Thurmon Fair MD PROCEDURE: After discussion of the risks and benefits of a TEE, an informed consent was obtained from the patient. The transesophogeal probe was passed without difficulty through the esophogus of the patient. Local oropharyngeal anesthetic was provided with Cetacaine. Sedation performed by different physician. The patient was monitored while under deep sedation. Anesthestetic sedation was provided intravenously by Anesthesiology: 100mg  of Propofol, 40mg  of Lidocaine. The patient developed no complications during the procedure. IMPRESSIONS  1. Left ventricular ejection fraction, by estimation, is 60 to 65%. The left ventricle has normal function. The left ventricle has no regional wall motion abnormalities. Left ventricular diastolic function could not be evaluated.  2. Right ventricular systolic function is mildly reduced. The right ventricular size is normal. There is moderately elevated pulmonary artery systolic pressure.  3. Left atrial size was moderately dilated. No left atrial/left atrial appendage thrombus was detected.  4. Right atrial size was moderately dilated.  5. Moderate pleural effusion in the left lateral region.  6. There is severe holosystolic prolapse of both mitral leaflets, but this is particularly severe at the level of the middle (P2) scallop and medial (P3) scallop of the posterior leaflet. P3 may be flail. The vena contracta is 6 mm and the effective regurgitant area is 1.3 cm. The regurgitant volume is 130 ml, the regurgitant fraction is 80%. The maximum flail gap is 8 mm (at P3). MV area is 6 cm. The mitral valve is myxomatous. Severe mitral valve regurgitation. The mean mitral valve gradient is 3.5 mmHg.  7. The tricuspid valve is myxomatous. Tricuspid valve regurgitation is mild to moderate.  8. The  aortic valve is tricuspid. Aortic valve regurgitation is not visualized. No aortic stenosis is present.  9. There is severe spontaneous echo contrast seen in the ascending aorta, consistent with very low forward cardiac output. FINDINGS  Left Ventricle: Left ventricular ejection fraction, by estimation, is 60 to 65%. The left ventricle has normal function. The left ventricle has no regional wall motion abnormalities. The left ventricular internal cavity size was normal in size. There is  no left ventricular hypertrophy. Left ventricular diastolic function could not be evaluated due to atrial fibrillation. Left ventricular diastolic function could not be evaluated. Right Ventricle: The right ventricular size is normal. No increase in right ventricular wall thickness. Right ventricular systolic function is mildly reduced. There is moderately elevated pulmonary artery systolic pressure. The tricuspid regurgitant velocity is 3.06 m/s, and with an assumed right atrial pressure of 10 mmHg, the estimated right ventricular systolic pressure is 47.5 mmHg. Left Atrium: Left atrial size was moderately dilated. No left atrial/left atrial appendage thrombus was detected. Right Atrium: Right atrial size was moderately dilated. Pericardium: There is no evidence of pericardial effusion. Mitral  Valve: There is severe holosystolic prolapse of both mitral leaflets, but this is particularly severe at the level of the middle (P2) scallop and medial (P3) scallop of the posterior leaflet. P3 may be flail. The vena contracta is 6 mm and the effective regurgitant area is 1.3 cm. The regurgitant volume is 130 ml, the regurgitant fraction is 80%. The maximum flail gap is 8 mm (at P3). MV area is 6 cm. The mitral valve is myxomatous. Severe mitral valve regurgitation, with anteriorly-directed  jet. The mean mitral valve gradient is 3.5 mmHg with average heart rate of 120 bpm. Tricuspid Valve: The tricuspid valve is myxomatous. Tricuspid  valve regurgitation is mild to moderate. There is mild prolapse of the tricuspid. Aortic Valve: The aortic valve is tricuspid. Aortic valve regurgitation is not visualized. No aortic stenosis is present. Pulmonic Valve: The pulmonic valve was normal in structure. Pulmonic valve regurgitation is mild. Aorta: There is severe spontaneous echo contrast seen in the ascending aorta, consistent with very low forward cardiac output. The aortic root, ascending aorta and aortic arch are all structurally normal, with no evidence of dilitation or obstruction. Venous: A pattern of systolic flow reversal, suggestive of severe mitral regurgitation is recorded from the right upper pulmonary vein. IAS/Shunts: No atrial level shunt detected by color flow Doppler. Additional Comments: There is a moderate pleural effusion in the left lateral region.  LEFT VENTRICLE PLAX 2D LVIDd:         4.60 cm LVIDs:         3.10 cm LVOT diam:     2.10 cm LV SV:         30 LV SV Index:   17 LVOT Area:     3.46 cm  LV Volumes (MOD) LV vol d, MOD A4C: 155.0 ml LV vol s, MOD A4C: 76.3 ml LV SV MOD A4C:     155.0 ml AORTIC VALVE LVOT Vmax:   77.40 cm/s LVOT Vmean:  50.000 cm/s LVOT VTI:    0.086 m MITRAL VALVE                   TRICUSPID VALVE MV Area (plan): 6.09 cm       TR Peak grad:   37.5 mmHg MV Mean grad:   3.5 mmHg       TR Vmax:        306.00 cm/s MR Peak grad:      72.6 mmHg MR Mean grad:      48.0 mmHg   SHUNTS MR Vmax:           426.00 cm/s Systemic VTI:  0.09 m MR Vmean:          327.0 cm/s  Systemic Diam: 2.10 cm MR Vena Contracta: 0.57 cm MR PISA:           17.82 cm MR PISA Eff ROA:   129 mm MR PISA Radius:    1.68 cm Mihai Croitoru MD Electronically signed by Thurmon Fair MD Signature Date/Time: 02/25/2021/11:02:17 AM    Final     Scheduled Meds:  (feeding supplement) PROSource Plus  30 mL Oral BID BM   amiodarone  200 mg Oral BID   amoxicillin-clavulanate  1 tablet Oral Q12H   Chlorhexidine Gluconate Cloth  6 each Topical  Daily   ciprofloxacin  1 drop Right Eye Q4H while awake   feeding supplement  1 Container Oral BID BM   feeding supplement  237 mL Oral Q24H   folic acid  1 mg Oral Daily   heparin injection (subcutaneous)  5,000 Units Subcutaneous Q8H   multivitamin with minerals  1 tablet Oral Daily   pantoprazole  40 mg Oral Daily   pneumococcal 23 valent vaccine  0.5 mL Intramuscular Tomorrow-1000   thiamine  100 mg Oral Daily   Or   thiamine  100 mg Intravenous Daily   Continuous Infusions:  sodium chloride 100 mL/hr at 02/25/21 1039   sodium chloride       LOS: 3 days   Rickey Barbara, MD [facility] Pager On Amion  If 7PM-7AM, please contact night-coverage 02/25/2021, 6:21 PM

## 2021-02-25 NOTE — Interval H&P Note (Signed)
History and Physical Interval Note:  02/25/2021 8:17 AM  Roy Ryan  has presented today for surgery, with the diagnosis of SEVERE MITRAL REGERGITATION.  The various methods of treatment have been discussed with the patient and family. After consideration of risks, benefits and other options for treatment, the patient has consented to  Procedure(s): TRANSESOPHAGEAL ECHOCARDIOGRAM (TEE) (N/A) as a surgical intervention.  The patient's history has been reviewed, patient examined, no change in status, stable for surgery.  I have reviewed the patient's chart and labs.  Questions were answered to the patient's satisfaction.     Roben Tatsch

## 2021-02-25 NOTE — Progress Notes (Signed)
ANTICOAGULATION CONSULT NOTE - follow up  Pharmacy Consult for Heparin  Indication: atrial fibrillation  No Known Allergies  Patient Measurements: Height: 6\' 2"  (188 cm) Weight: 53 kg (116 lb 13.5 oz) IBW/kg (Calculated) : 82.2 Heparin Dosing Weight: actual weight   Vital Signs: Temp: 97.4 F (36.3 C) (08/25 0200) Temp Source: Axillary (08/25 0200) BP: 123/101 (08/25 0215) Pulse Rate: 90 (08/25 0115)  Labs: Recent Labs    02/22/21 1310 02/22/21 1605 02/23/21 0047 02/23/21 0937 02/24/21 0303 02/25/21 0357  HGB 10.3*  --   --  10.5* 9.8* 9.3*  HCT 31.9*  --   --  32.5* 29.8* 28.0*  PLT 92*  --   --  101* 97* 97*  APTT  --   --   --   --   --  136*  CREATININE 0.83  --    < > 0.69 0.86 0.92  TROPONINIHS 6 7  --   --   --   --    < > = values in this interval not displayed.     Estimated Creatinine Clearance: 58.4 mL/min (by C-G formula based on SCr of 0.92 mg/dL).   Medical History: Past Medical History:  Diagnosis Date   Alcohol abuse    Atrial flutter (HCC)    Mitral regurgitation    Seizures [facility])    Assessment: 70 y/o M being worked up by cardiology for atrial fibrillation and mitral regurgitation with prolapse. Patient is currently on Eliquis and is scheduled for TEE in AM. Pharmacy consulted to transition to heparin infusion. last apixaban dose around 0930 on 8/24  02/25/21  aPTT 136 supra-therapeutic on 800 units/hr Hgb 9.3, Plts 97 - no significant bleeding reported   Goal of Therapy:  Heparin level 0.3-0.7 units/ml aPTT 66-102 seconds Monitor platelets by anticoagulation protocol: Yes   Plan:  Hold heparin x 1 hour then resume heparin at 650 units/hr aPTT in 6 hours F/U plans for anticoagulation after TEE  02/27/21 RPh 02/25/2021, 5:00 AM

## 2021-02-25 NOTE — Progress Notes (Signed)
F/u arranged, appt info placed on AVS.

## 2021-02-25 NOTE — Op Note (Signed)
INDICATIONS: Mitral insufficiency  PROCEDURE:   Informed consent was obtained prior to the procedure. The risks, benefits and alternatives for the procedure were discussed and the patient comprehended these risks.  Risks include, but are not limited to, cough, sore throat, vomiting, nausea, somnolence, esophageal and stomach trauma or perforation, bleeding, low blood pressure, aspiration, pneumonia, infection, trauma to the teeth and death.    After a procedural time-out, the oropharynx was anesthetized with 20% benzocaine spray.   During this procedure the patient was administered IV propofol by Anesthesiology.  The transesophageal probe was inserted in the esophagus and stomach without difficulty and multiple views were obtained.  The patient was kept under observation until the patient left the procedure room.  The patient left the procedure room in stable condition.   Agitated microbubble saline contrast was not administered.  COMPLICATIONS:    There were no immediate complications.  FINDINGS:  Normal LV function. Normal left atrial size. 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 There is flail motion of the P3 (medial) scallop and also severe prolapse of the P2 (middle) scallop of the posterior leaflet. There is severe, highly eccentric , mitral regurgitation oriented anteriorly. Moderate pulmonary HTN. Left pleural effusion. Spontaneous echo contrast in the thoracic aorta consistent with low forward cardiac output.  RECOMMENDATIONS:    Appears to have Barlow's sd with very severe MR. A component of acute MR is likely present. He is a very poor candidate for surgical repair (malnutrition, cachexia) and MitraClip repair would be very challenging due to the complex and extensive mitral valve pathology and the relatively small left atrium.  Time Spent Directly with the Patient:  45 minutes   Roy Ryan 02/25/2021,  8:41 AM

## 2021-02-25 NOTE — Progress Notes (Signed)
Echocardiogram Echocardiogram Transesophageal has been performed.  Warren Lacy Nailani Full RDCS 02/25/2021, 9:05 AM

## 2021-02-25 NOTE — Anesthesia Postprocedure Evaluation (Signed)
Anesthesia Post Note  Patient: Roy Ryan  Procedure(s) Performed: TRANSESOPHAGEAL ECHOCARDIOGRAM (TEE)     Patient location during evaluation: Endoscopy Anesthesia Type: MAC Level of consciousness: awake and alert Pain management: pain level controlled Vital Signs Assessment: post-procedure vital signs reviewed and stable Respiratory status: spontaneous breathing, nonlabored ventilation, respiratory function stable and patient connected to nasal cannula oxygen Cardiovascular status: stable and blood pressure returned to baseline Postop Assessment: no apparent nausea or vomiting Anesthetic complications: no   No notable events documented.  Last Vitals:  Vitals:   02/25/21 0923 02/25/21 0933  BP: 105/65 93/75  Pulse:    Resp: 11 15  Temp:    SpO2: 99% 98%    Last Pain:  Vitals:   02/25/21 1025  TempSrc:   PainSc: 0-No pain                 Belenda Cruise P Hershall Benkert

## 2021-02-25 NOTE — Progress Notes (Signed)
Progress Note  Patient Name: Roy Ryan Date of Encounter: 02/25/2021  [facility] Cardiologist: Roy Batty, MD   Subjective   No complaints.  Just back for TEE showing severely myxomatous MV disease with Barlow's and mluti segment prolapse of anterior and posterior MV leaflets with flail P3 scallop and severe prolapse of P2 scallop and severe AR, moderate pulmonary HTN.  Denies any SOB  Inpatient Medications    Scheduled Meds:  (feeding supplement) PROSource Plus  30 mL Oral BID BM   amoxicillin-clavulanate  1 tablet Oral Q12H   Chlorhexidine Gluconate Cloth  6 each Topical Daily   ciprofloxacin  1 drop Right Eye Q4H while awake   feeding supplement  1 Container Oral BID BM   feeding supplement  237 mL Oral Q24H   folic acid  1 mg Oral Daily   multivitamin with minerals  1 tablet Oral Daily   pantoprazole  40 mg Oral Daily   pneumococcal 23 valent vaccine  0.5 mL Intramuscular Tomorrow-1000   thiamine  100 mg Oral Daily   Or   thiamine  100 mg Intravenous Daily   Continuous Infusions:  sodium chloride 100 mL/hr at 02/25/21 1039   amiodarone 30 mg/hr (02/25/21 1042)   heparin 650 Units/hr (02/25/21 0843)   sodium chloride     PRN Meds: acetaminophen **OR** acetaminophen, LORazepam **OR** LORazepam, sodium chloride   Vital Signs    Vitals:   02/25/21 0903 02/25/21 0913 02/25/21 0923 02/25/21 0933  BP: 99/71 92/78 105/65 93/75  Pulse: (!) 103 (!) 103    Resp: Temp:      TempSrc:      SpO2: 100% 98% 99% 98%  Weight:      Height:        Intake/Output Summary (Last 24 hours) at 02/25/2021 1051 Last data filed at 02/25/2021 0854 Gross per 24 hour  Intake 4009.76 ml  Output 400 ml  Net 3609.76 ml    Last 3 Weights 02/22/2021 02/01/2020 01/31/2020  Weight (lbs) 116 lb 13.5 oz 135 lb 12.8 oz 165 lb  Weight (kg) 53 kg 61.598 kg 74.844 kg      Telemetry    Atrial fibrillation with CVR - Personally Reviewed  ECG    No new EKG to review  Personally Reviewed  Physical Exam   GEN: ill appearing, malnourished, cachectic HEENT: Normal NECK:No carotid bruits LYMPHATICS: No lymphadenopathy CARDIAC:irregularly irregular, no rubs, gallops 2.6 holosystolic murmur at the LLSB to apex RESPIRATORY:  Clear to auscultation without rales, wheezing or rhonchi  ABDOMEN: Soft, non-tender, non-distended MUSCULOSKELETAL:  No edema; No deformity  SKIN: Warm and dry NEUROLOGIC:  Alert and oriented x 3 PSYCHIATRIC:  Normal affect   Labs    High Sensitivity Troponin:   Recent Labs  Lab 02/22/21 1310 02/22/21 1605  TROPONINIHS 6 7       Chemistry Recent Labs  Lab 02/22/21 1310 02/23/21 0047 02/23/21 0937 02/24/21 0303 02/25/21 0357  NA 133*  --  133* 134* 135  K 4.5  --  3.2* 4.6 4.2  CL 97*  --  105 107 108  CO2 22  --  20* 20* 19*  GLUCOSE 250*  --  114* 315* 156*  BUN 5*  --  <5* <5* 6*  CREATININE 0.83   < > 0.69 0.86 0.92  CALCIUM 8.5*  --  8.7* 8.6* 8.7*  PROT 6.5  --   --  6.5 6.2*  ALBUMIN 3.0*  --   --  2.9* 2.9*  AST 31  --   --  22 47*  ALT 16  --   --  8 13  ALKPHOS 75  --   --  78 67  BILITOT 3.5*  --  3.1* 2.0* 1.5*  GFRNONAA >60   < > >60 >60 >60  ANIONGAP 14  --  < > = values in this interval not displayed.      Hematology Recent Labs  Lab 02/23/21 0937 02/24/21 0303 02/25/21 0357  WBC 4.3 2.3* 4.7  RBC 3.70*  3.64* 3.46* 3.20*  HGB 10.5* 9.8* 9.3*  HCT 32.5* 29.8* 28.0*  MCV 87.8 86.1 87.5  MCH 28.4 28.3 29.1  MCHC 32.3 32.9 33.2  RDW 16.3* 16.3* 16.9*  PLT 101* 97* 97*     BNPNo results for input(s): BNP, PROBNP in the last 168 hours.   DDimer  Recent Labs  Lab 02/23/21 [id number]  DDIMER 5.33*      CHA2DS2-VASc Score = 1  This indicates a 0.6% annual risk of stroke. The patient's score is based upon: CHF History: No HTN History: No Diabetes History: No Stroke History: No Vascular Disease History: No Age Score: 1 Gender Score: 0       Radiology    EEG  adult  Result Date: 02/23/2021 Roy Quest, MD     02/23/2021 12:51 PM Patient Name: Roy Ryan MRN: [id number] Epilepsy Attending: Charlsie Ryan Referring Physician/Provider: Dr Roy Ryan, Date: 02/23/2021 Duration: 21.03 mins Patient history: 70 year old male with altered mental status.  EEG to evaluate for seizures. Level of alertness: Awake AEDs during EEG study: None Technical aspects: This EEG study was done with scalp electrodes positioned according to the 10-20 International system of electrode placement. Electrical activity was acquired at a sampling rate of  and reviewed with a high frequency filter of  and a low frequency filter of . EEG data were recorded continuously and digitally stored. Description: No clear posterior dominant rhythm was seen.  EEG showed continuous generalized 3 to 5 Hz theta-delta slowing. Hyperventilation and photic stimulation were not performed.   ABNORMALITY - Continuous slow, generalized IMPRESSION: This study is suggestive of moderate diffuse encephalopathy, nonspecific etiology. No seizures or epileptiform discharges were seen throughout the recording. Roy Ryan   ECHOCARDIOGRAM COMPLETE  Result Date: 02/23/2021    ECHOCARDIOGRAM REPORT   Patient Name:   Roy Ryan Date of Exam: 02/23/2021 Medical Rec #:  [id number]    Height:       74.0 in Accession #:    [id number]   Weight:       116.8 lb Date of Birth:  [birth year]     BSA:          1.730 m Patient Age:    69 years     BP:           91/63 mmHg Patient Gender: M            HR:           88 bpm. Exam Location:  Inpatient Procedure: 2D Echo, 3D Echo, Cardiac Doppler and Color Doppler Indications:    I48.91* Unspeicified atrial fibrillation  History:        Patient has prior history of Echocardiogram examinations, most                 recent 01/31/2020. CHF, Abnormal ECG, Mitral Valve Prolapse and  Mitral Valve Disease, Arrythmias:Atrial Fibrillation;                  Signs/Symptoms:Hypotension and Chest Pain. ETOH.  Sonographer:    Sheralyn Boatman RDCS Referring Phys: [id number] Roy Ryan IMPRESSIONS  1. 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 Very eccentric, anteriorly directed jet of mitral regurgitation. P2 scallop prolapse/possible flail noted on 3D images.. The mitral valve is myxomatous. Severe mitral valve regurgitation. No evidence of mitral stenosis.  2. Left ventricular ejection fraction, by estimation, is 50%. Left ventricular ejection fraction by 3D volume is 53 %. The left ventricle has mildly decreased function. The left ventricle has no regional wall motion abnormalities. There is mild left ventricular hypertrophy. Left ventricular diastolic parameters are indeterminate.  3. Right ventricular systolic function is moderately reduced. The right ventricular size is mildly enlarged. There is moderately elevated pulmonary artery systolic pressure. The estimated right ventricular systolic pressure is 52.2 mmHg.  4. Left atrial size was severely dilated.  5. Right atrial size was moderately dilated.  6. Tricuspid valve regurgitation is moderate.  7. The aortic valve is tricuspid. There is mild thickening of the aortic valve. Aortic valve regurgitation is mild. No aortic stenosis is present.  8. Aortic dilatation noted. There is borderline dilatation of the aortic root, measuring 39 mm.  9. The inferior vena cava is dilated in size with <50% respiratory variability, suggesting right atrial pressure of 15 mmHg. Conclusion(s)/Recommendation(s): Recommend TEE for further evaluation of mitral valve prolapse. FINDINGS  Left Ventricle: Left ventricular ejection fraction, by estimation, is 50%. Left ventricular ejection fraction by 3D volume is 53 %. The left ventricle has mildly decreased function. The left ventricle has no regional wall motion abnormalities. The left ventricular internal cavity size was normal in size. There is mild left ventricular hypertrophy.  Left ventricular diastolic parameters are indeterminate. Right Ventricle: The right ventricular size is mildly enlarged. No increase in right ventricular wall thickness. Right ventricular systolic function is moderately reduced. There is moderately elevated pulmonary artery systolic pressure. The tricuspid regurgitant velocity is 3.05 m/s, and with an assumed right atrial pressure of 15 mmHg, the estimated right ventricular systolic pressure is 52.2 mmHg. Left Atrium: Left atrial size was severely dilated. Right Atrium: Right atrial size was moderately dilated. Pericardium: There is no evidence of pericardial effusion. Mitral Valve: Bileaflet mitral valve prolapse, posterior > anterior. Very eccentric, anteriorly directed jet of mitral regurgitation. P2 scallop prolapse/possible flail noted on 3D images. The mitral valve is myxomatous. Severe mitral valve regurgitation. No evidence of mitral valve stenosis. Tricuspid Valve: The tricuspid valve is normal in structure. Tricuspid valve regurgitation is moderate . No evidence of tricuspid stenosis. Aortic Valve: The aortic valve is tricuspid. There is mild thickening of the aortic valve. Aortic valve regurgitation is mild. Aortic regurgitation PHT measures 1091 msec. No aortic stenosis is present. Pulmonic Valve: The pulmonic valve was grossly normal. Pulmonic valve regurgitation is trivial. No evidence of pulmonic stenosis. Aorta: Aortic dilatation noted. There is borderline dilatation of the aortic root, measuring 39 mm. Venous: The inferior vena cava is dilated in size with less than 50% respiratory variability, suggesting right atrial pressure of 15 mmHg. IAS/Shunts: No atrial level shunt detected by color flow Doppler.  LEFT VENTRICLE PLAX 2D LVIDd:         4.70 cm LVIDs:         3.10 cm LV PW:         1.00 cm  3D Volume EF LV IVS:        1.10 cm         LV 3D EF:    Left LVOT diam:     1.90 cm                      ventricul LVOT Area:     2.84 cm                      ar                                             ejection                                             fraction LV Volumes (MOD)                            by 3D LV vol d, MOD    110.0 ml                   volume is A2C:                                        53 %. LV vol d, MOD    77.2 ml A4C: LV vol s, MOD    68.3 ml       3D Volume EF: A2C:                           3D EF:        53 % LV vol s, MOD    43.9 ml A4C: LV SV MOD A2C:   41.7 ml LV SV MOD A4C:   77.2 ml LV SV MOD BP:    40.3 ml RIGHT VENTRICLE            IVC RV S prime:     4.79 cm/s  IVC diam: 2.40 cm TAPSE (M-mode): 1.0 cm LEFT ATRIUM              Index       RIGHT ATRIUM           Index LA diam:        4.80 cm  2.78 cm/m  RA Area:     24.20 cm LA Vol (A2C):   170.0 ml 98.29 ml/m RA Volume:   86.80 ml  50.18 ml/m LA Vol (A4C):   71.1 ml  41.11 ml/m LA Biplane Vol: 111.0 ml 64.18 ml/m  AORTIC VALVE AI PHT:      1091 msec  AORTA Ao Root diam: 3.90 cm Ao Asc diam:  3.00 cm MITRAL VALVE                 TRICUSPID VALVE MV Area (PHT): 5.27 cm      TR Peak grad:   37.2 mmHg MV Decel Time: 144 msec      TR Vmax:        305.00 cm/s MR Peak grad:    72.9 mmHg MR  Mean grad:    43.0 mmHg   SHUNTS MR Vmax:         427.00 cm/s Systemic Diam: 1.90 cm MR Vmean:        308.0 cm/s MR PISA:         6.93 cm MR PISA Eff ROA: 62 mm MR PISA Radius:  1.05 cm MV E velocity: 143.67 cm/s Weston BrassGayatri Acharya MD Electronically signed by Weston BrassGayatri Acharya MD Signature Date/Time: 02/23/2021/5:18:37 PM    Final     Cardiac Studies   Relevant CV Studies:   Echo 01/31/20 1. Left ventricular ejection fraction, by estimation, is 50 to 55%. The  left ventricle has low normal function. The left ventricle has no regional  wall motion abnormalities. Left ventricular diastolic parameters are  indeterminate.   2. Right ventricular systolic function is moderately reduced. The right  ventricular size is normal. There is moderately elevated pulmonary artery  systolic  pressure. The estimated right ventricular systolic pressure is  53.7 mmHg.   3. Left atrial size was severely dilated.   4. Right atrial size was moderately dilated.   5. The mitral valve is myxomatous. There is moderate holosystolic  prolapse of the medial scallop of the posterior leaflet of the mitral  valve. The anterior leaflet is mildly elongated. Normal mobility of the  mitral valve leaflets. Moderate mitral valve  regurgitation, with eccentric anteriorly directed jet. No evidence of  mitral valve stenosis.   6. The aortic valve is tricuspid. Aortic valve regurgitation is not  visualized. Mild aortic valve sclerosis is present, with no evidence of  aortic valve stenosis.   7. The inferior vena cava is dilated in size with >50% respiratory  variability, suggesting right atrial pressure of 8 mmHg.   8. Consider TEE for further evaluation of mital valve.   2D echo 02/23/2021 IMPRESSIONS    1. 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 Very eccentric,  anteriorly directed jet of mitral regurgitation. P2 scallop  prolapse/possible flail noted on 3D images.. The mitral valve is  myxomatous. Severe mitral valve regurgitation. No  evidence of mitral stenosis.   2. Left ventricular ejection fraction, by estimation, is 50%. Left  ventricular ejection fraction by 3D volume is 53 %. The left ventricle has  mildly decreased function. The left ventricle has no regional wall motion  abnormalities. There is mild left  ventricular hypertrophy. Left ventricular diastolic parameters are  indeterminate.   3. Right ventricular systolic function is moderately reduced. The right  ventricular size is mildly enlarged. There is moderately elevated  pulmonary artery systolic pressure. The estimated right ventricular  systolic pressure is 52.2 mmHg.   4. Left atrial size was severely dilated.   5. Right atrial size was moderately dilated.   6. Tricuspid valve regurgitation is moderate.    7. The aortic valve is tricuspid. There is mild thickening of the aortic  valve. Aortic valve regurgitation is mild. No aortic stenosis is present.   8. Aortic dilatation noted. There is borderline dilatation of the aortic  root, measuring 39 mm.   9. The inferior vena cava is dilated in size with <50% respiratory  variability, suggesting right atrial pressure of 15 mmHg.   Conclusion(s)/Recommendation(s): Recommend TEE for further evaluation of  mitral valve prolapse.   Patient Profile     70 y.o. male with a hx of mitral regurgitation with prolapse, pulmonary hypertension, homelessness, paroxysmal atrial flutter and alcohol abuse who is being seen 02/23/2021 for the evaluation of atrial fibrillation at  the request of Roy. Sharl Ma.  Assessment & Plan    Atrial fibrillation with rapid ventricular rate -Patient was found to be in atrial fibrillation in setting up hypoglycemia, hypotension, possible sepsis of unknown etiology (resolved) and adrenal insufficiency. -He has very poor living conditions with out food or water at the hotel he has been staying out for unknown length of time. -hospitalized in 2021 with atrial flutter and left AMA and never followed up>>at that time deemed not a candidate for anticoagulation due to his low CHADS2VASC score of 1 and poor social situation with high likelihood of not following up, noncompliance with meds and ETOH abuse -suspect that he has been in atrial flutter since 2021 -uncontrolled afib likely related to rebound from hypotension and has improved with IVF and with IV Amio -2D echo with low normal LVF and now severe MR and severe LAE likely driving ongoing atrial arrhythmias -BP too soft for addition of CCB or BB -currently on IV Amio for HR control>>not the best longterm option in a patient with ETOH abuse but have no other options at this time due to hypotension -not a candidate for DCCV due to need to be compliant with DOAC post cardioversion for at  least 4 weeks and his poor social situation and noncompliance as well as ETOH abuse would make this difficult and would likely not maintain NSR in setting of severe MR -change IV Amio to Amio 200mg  BID (likely will not be compliant) -he is a long term candidate for anticoagulation due to his poor living conditions, ETOH abuse and noncompliance with followup.  Will stop IV Heparin gtt   CHA2DS2-VASc Score = 1  This indicates a 0.6% annual risk of stroke. 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 in setting of severe MR and also has moderate TR with elevated RAP of -TEE today showed  severely myxomatous MV disease with Barlow's and mluti segment prolapse of anterior and posterior MV leaflets with flail P3 scallop and severe prolapse of P2 scallop and severe AR, moderate pulmonary HTN.  -he is not a candidate for surgery due to severe malnutrition and cachexia -Discussed with Roy. who will discuss Mitra-Clip with Structural heart colleague but felt likely not to be a candidate -recommend Palliative Care Consult for goals of care  Adrenal insufficiency -Undergoing work-up per primary team   Alcohol abuse -On CIWA protocol  [facility] will sign off.   Medication Recommendations:  Amiodarone 200mg  BID Other recommendations (labs, testing, etc):  none Follow up as an outpatient:  Roy. Royann Shivers 1-2 weeks     I have spent a total of 35 minutes with patient reviewing 2D echo , telemetry, EKGs, labs and examining patient as well as establishing an assessment and plan that was  discussed with the patient.  > 50% of time was spent in direct patient care.     For questions or updates, please contact [facility] Please consult [email or web address] for contact info under        Signed, , MD  02/25/2021, 10:51 AM

## 2021-02-25 NOTE — Progress Notes (Signed)
[facility] has arrived to take patient to [facility] for TEE.

## 2021-02-25 NOTE — Transfer of Care (Signed)
Immediate Anesthesia Transfer of Care Note  Patient: Roy Ryan  Procedure(s) Performed: TRANSESOPHAGEAL ECHOCARDIOGRAM (TEE)  Patient Location: PACU and Endoscopy Unit  Anesthesia Type:MAC  Level of Consciousness: drowsy and responds to stimulation  Airway & Oxygen Therapy: Patient Spontanous Breathing and Patient connected to nasal cannula oxygen  Post-op Assessment: Report given to RN and Post -op Vital signs reviewed and stable  Post vital signs: Reviewed and stable  Last Vitals:  Vitals Value Taken Time  BP 98/65 02/25/21 0853  Temp 36.2 C 02/25/21 0853  Pulse 92 02/25/21 0854  Resp 11 02/25/21 0854  SpO2 100 % 02/25/21 0854  Vitals shown include unvalidated device data.  Last Pain:  Vitals:   02/25/21 0853  TempSrc: Temporal  PainSc: 0-No pain      Patients Stated Pain Goal: 2 (16/10/96 0454)  Complications: No notable events documented.

## 2021-02-26 ENCOUNTER — Encounter (HOSPITAL_COMMUNITY): Payer: Self-pay | Admitting: Cardiovascular Disease

## 2021-02-26 DIAGNOSIS — I4891 Unspecified atrial fibrillation: Secondary | ICD-10-CM | POA: Diagnosis not present

## 2021-02-26 DIAGNOSIS — E162 Hypoglycemia, unspecified: Secondary | ICD-10-CM | POA: Diagnosis not present

## 2021-02-26 LAB — GLUCOSE, CAPILLARY
Glucose-Capillary: 111 mg/dL — ABNORMAL HIGH (ref 70–99)
Glucose-Capillary: 73 mg/dL (ref 70–99)
Glucose-Capillary: 77 mg/dL (ref 70–99)
Glucose-Capillary: 79 mg/dL (ref 70–99)
Glucose-Capillary: 87 mg/dL (ref 70–99)
Glucose-Capillary: 90 mg/dL (ref 70–99)

## 2021-02-26 LAB — CBC
HCT: 28 % — ABNORMAL LOW (ref 39.0–52.0)
Hemoglobin: 9.1 g/dL — ABNORMAL LOW (ref 13.0–17.0)
MCH: 28.7 pg (ref 26.0–34.0)
MCHC: 32.5 g/dL (ref 30.0–36.0)
MCV: 88.3 fL (ref 80.0–100.0)
Platelets: 82 10*3/uL — ABNORMAL LOW (ref 150–400)
RBC: 3.17 MIL/uL — ABNORMAL LOW (ref 4.22–5.81)
RDW: 17.2 % — ABNORMAL HIGH (ref 11.5–15.5)
WBC: 6.3 10*3/uL (ref 4.0–10.5)
nRBC: 0 % (ref 0.0–0.2)

## 2021-02-26 MED ORDER — MAGNESIUM SULFATE 2 GM/50ML IV SOLN
2.0000 g | Freq: Once | INTRAVENOUS | Status: AC
  Administered 2021-02-26: 2 g via INTRAVENOUS
  Filled 2021-02-26: qty 50

## 2021-02-26 MED ORDER — COSYNTROPIN 0.25 MG IJ SOLR
0.2500 mg | Freq: Once | INTRAMUSCULAR | Status: AC
  Administered 2021-02-27: 0.25 mg via INTRAVENOUS
  Filled 2021-02-26: qty 0.25

## 2021-02-26 NOTE — Care Management Important Message (Signed)
Medicare IM printed for [place] [employer] to give to the patient. 

## 2021-02-26 NOTE — Progress Notes (Signed)
PROGRESS NOTE    Roy Ryan  SJG:[id number] DOB: [birth year] DOA: 02/22/2021 PCP: Patient, No Pcp Per (Inactive)    Brief Narrative:  70 year old male with medical history of alcohol abuse, alcohol withdrawal seizures, atypical flutter, pulmonary hypertension presented to ED via EMS for evaluation of altered mental status.  Patient currently residing at hotel and was found to be hypoglycemic with CBG 37.  EMS reported poor living conditions, feces and urine bed, rotting food, multiple alcohol bottles.  Blood pressure was soft was given IV fluid bolus.  In the ED he was hypothermic with rectal temperature 95.2.  He was placed on [name]  Initially placed on empiric antibiotics for severe sepsis however no clear source of infection identified.  Due to persistent hypotension PCCM was consulted, found to have very low cortisol 2.1, cosyntropin stim test ordered for adrenal insufficiency.  He received IV fluid boluses, noted on pressor support.  Patient also found to be in A. fib with RVR.  Cardiology consulted.  Chest x-ray also showed progression fractures at C5-6-7   Assessment & Plan:   Principal Problem:   Hypotension Active Problems:   Atrial fibrillation with rapid ventricular response (HCC)   Hypomagnesemia   Hypoglycemia   Hypothermia   Protein-calorie malnutrition, severe   Nonrheumatic mitral valve regurgitation  Toxic metabolic encephalopathy -Noted to be back to baseline, appropriately conversant -EEG showed moderate diffuse encephalopathy, no seizures or epileptiform discharges seen -CT head was unremarkable, showed chronic right maxillary sinus disease, air-fluid in the left frontal sinus may represent acute or chronic sinusitis -Recent B12 2040, ammonia 22 -UDS noted to be neg   Hypotension/adrenal insufficiency -Likely due to adrenal insufficiency; cortisol level was 2.1 -Cortisone stimulation test was ordered, pending -Patient received 1 dose of Solu-Cortef 100 mg IV  x2 -Also found to be in A. fib with RVR -No infectious source identified; chest x-ray showed no acute cardiopulmonary disease, lactic acid 1.5, procalcitonin less than 0.10, WBC 4.3, UA was clear -Was started on empiric antibiotics, PCCM was also consulted -Did not require pressor support, bp currently stable   Thoracic compression fracture  -Initial chest x-ray chest x-ray showed progression fractures at C5-6-7 -Noted to have compression fractures at T5-T6 and T7 in thoracic spine which are stable from CT scan from 01/01/2020.  Without evidence of progression   New onset atrial fibrillation with RVR -Started on apixaban 5 mg twice a day; CHA2DS2VASc score of 1 -Cardiology initially planned for TEE/DCCV, however given hx noncompliance and social issues, likely not candidate   Alcohol abuse -Had been continued on CIWA protocol -Continue thiamine   Subclinical hypothyroidism -TSH 7.133, T4 1.21 -Will need repeat outpatient thyroid function test in 6 to 8 weeks   Hypomagnesemia -Cont to replace lytes as needed -repeat bmetin AM   Hyperbilirubinemia -Total bilirubin 3.5, improved to 3.1 this morning -Intact bilirubin is elevated at 1.9, direct bilirubin 1.2 -Unclear etiology, likely shock liver from hypotension -Continue to follow LFT's   Hypokalemia -Continue to replace lytes as needed   Sinusitis -Seen on CT head -Now on Augmentin   Alcohol abuse -No signs of alcohol withdrawal -Continue with CIWA protocol   Homeless issues -Social work consulted   Pancytopenia -Mild; likely from chronic alcohol abuse  Mitral regurg with prolapse  -Pt is now s/p TEE with evidence of severe MR with a component of acute MR likely present. Noted to be a poor candidate for surgical repair -Cardiology has recommended f/u with Palliative Care to establish goals of  care -Palliative Care now consulted  DVT prophylaxis: Heparin subq Code Status: Full Family Communication: Pt in room,  family not at bedside  Status is: Inpatient  Remains inpatient appropriate because:Inpatient level of care appropriate due to severity of illness  Dispo: The patient is from: Home              Anticipated d/c is to: Home              Patient currently is not medically stable to d/c.   Difficult to place patient No  Consultants:  PCCM Cardiology Palliative Care  Procedures:    Antimicrobials: Anti-infectives (From admission, onward)    Start     Dose/Rate Route Frequency Ordered Stop   02/24/21 1800  amoxicillin-clavulanate (AUGMENTIN) 875-125 MG per tablet 1 tablet        1 tablet Oral Every 12 hours 02/24/21 0949 03/01/21 2359   02/24/21 0000  vancomycin (VANCOCIN) IVPB 1000 mg/200 mL premix  Status:  Discontinued        1,000 mg 200 mL/hr over 60 Minutes Intravenous Every 24 hours 02/23/21 0022 02/24/21 0949   02/23/21 0115  piperacillin-tazobactam (ZOSYN) IVPB 3.375 g  Status:  Discontinued        3.375 g 12.5 mL/hr over 240 Minutes Intravenous Every 8 hours 02/23/21 0015 02/24/21 0949   02/23/21 0115  vancomycin (VANCOCIN) IVPB 1000 mg/200 mL premix        1,000 mg 200 mL/hr over 60 Minutes Intravenous  Once 02/23/21 0015 02/23/21 0151       Subjective: Without complaints  Objective: Vitals:   02/25/21 2245 02/26/21 0222 02/26/21 0622 02/26/21 1436  BP: (!) 97/53 98/73 93/62  112/78  Pulse: 79 99 (!) 59 66  Resp:  20 14 16   Temp: 97.7 F (36.5 C) 98.2 F (36.8 C) (!) 97.5 F (36.4 C) 98.6 F (37 C)  TempSrc: Axillary Oral Oral Oral  SpO2: 100% 95%  98%  Weight:      Height:        Intake/Output Summary (Last 24 hours) at 02/26/2021 1530 Last data filed at 02/26/2021 1437 Gross per 24 hour  Intake 946 ml  Output 150 ml  Net 796 ml    Filed Weights   02/22/21 2308  Weight: 53 kg    Examination: General exam: Awake, laying in bed, in nad Respiratory system: Normal respiratory effort, no wheezing Cardiovascular system: regular rate, s1,  s2 Gastrointestinal system: Soft, nondistended, positive BS Central nervous system: CN2-12 grossly intact, strength intact Extremities: Perfused, no clubbing Skin: Normal skin turgor, no notable skin lesions seen Psychiatry: Mood normal // no visual hallucinations   Data Reviewed: I have personally reviewed following labs and imaging studies  CBC: Recent Labs  Lab 02/22/21 1310 02/23/21 0937 02/24/21 0303 02/25/21 0357 02/26/21 0531  WBC 3.0* 4.3 2.3* 4.7 6.3  NEUTROABS 1.3*  --   --   --   --   HGB 10.3* 10.5* 9.8* 9.3* 9.1*  HCT 31.9* 32.5* 29.8* 28.0* 28.0*  MCV 88.9 87.8 86.1 87.5 88.3  PLT 92* 101* 97* 97* 82*    Basic Metabolic Panel: Recent Labs  Lab 02/22/21 1310 02/23/21 0047 02/23/21 0937 02/24/21 0303 02/25/21 0357 02/25/21 1750  NA 133*  --  133* 134* 135  --   K 4.5  --  3.2* 4.6 4.2  --   CL 97*  --  105 107 108  --   CO2 22  --  20* 20* 19*  --  GLUCOSE 250*  --  114* 315* 156*  --   BUN 5*  --  <5* <5* 6*  --   CREATININE 0.83 0.71 0.69 0.86 0.92  --   CALCIUM 8.5*  --  8.7* 8.6* 8.7*  --   MG  --  1.6*  --  2.0  --  1.7  PHOS  --  2.5  --   --   --   --     GFR: Estimated Creatinine Clearance: 58.4 mL/min (by C-G formula based on SCr of 0.92 mg/dL). Liver Function Tests: Recent Labs  Lab 02/22/21 1310 02/23/21 0937 02/24/21 0303 02/25/21 0357  AST 31  --  22 47*  ALT 16  --  8 13  ALKPHOS 75  --  78 67  BILITOT 3.5* 3.1* 2.0* 1.5*  PROT 6.5  --  6.5 6.2*  ALBUMIN 3.0*  --  2.9* 2.9*    No results for input(s): LIPASE, AMYLASE in the last 168 hours. Recent Labs  Lab 02/22/21 1310  AMMONIA 22    Coagulation Profile: No results for input(s): INR, PROTIME in the last 168 hours. Cardiac Enzymes: No results for input(s): CKTOTAL, CKMB, CKMBINDEX, TROPONINI in the last 168 hours. BNP (last 3 results) No results for input(s): PROBNP in the last 8760 hours. HbA1C: No results for input(s): HGBA1C in the last 72 hours. CBG: Recent  Labs  Lab 02/25/21 2034 02/26/21 0016 02/26/21 0427 02/26/21 0741 02/26/21 1211  GLUCAP 95 77 73 90 87    Lipid Profile: No results for input(s): CHOL, HDL, LDLCALC, TRIG, CHOLHDL, LDLDIRECT in the last 72 hours. Thyroid Function Tests: No results for input(s): TSH, T4TOTAL, FREET4, T3FREE, THYROIDAB in the last 72 hours.  Anemia Panel: No results for input(s): VITAMINB12, FOLATE, FERRITIN, TIBC, IRON, RETICCTPCT in the last 72 hours.  Sepsis Labs: Recent Labs  Lab 02/22/21 1605 02/23/21 0047  PROCALCITON  --  <0.10  LATICACIDVEN 1.2 1.5     Recent Results (from the past 240 hour(s))  Resp Panel by RT-PCR (Flu A&B, Covid) Nasopharyngeal Swab     Status: None   Collection Time: 02/22/21  5:04 PM   Specimen: Nasopharyngeal Swab; Nasopharyngeal(NP) swabs in vial transport medium  Result Value Ref Range Status   SARS Coronavirus 2 by RT PCR NEGATIVE NEGATIVE Final    Comment: (NOTE) SARS-CoV-2 target nucleic acids are NOT DETECTED.  The SARS-CoV-2 RNA is generally detectable in upper respiratory specimens during the acute phase of infection. The lowest concentration of SARS-CoV-2 viral copies this assay can detect is 138 copies/mL. A negative result does not preclude SARS-Cov-2 infection and should not be used as the sole basis for treatment or other patient management decisions. A negative result may occur with  improper specimen collection/handling, submission of specimen other than nasopharyngeal swab, presence of viral mutation(s) within the areas targeted by this assay, and inadequate number of viral copies(<138 copies/mL). A negative result must be combined with clinical observations, patient history, and epidemiological information. The expected result is Negative.  Fact Sheet for Patients:  BloggerCourse.com  Fact Sheet for Healthcare Providers:  SeriousBroker.it  This test is no t yet approved or cleared by  the [place] FDA and  has been authorized for detection and/or diagnosis of SARS-CoV-2 by FDA under an Emergency Use Authorization (EUA). This EUA will remain  in effect (meaning this test can be used) for the duration of the COVID-19 declaration under Section 564(b)(1) of the Act, 21 U.S.C.section 360bbb-3(b)(1), unless the authorization  is terminated  or revoked sooner.       Influenza A by PCR NEGATIVE NEGATIVE Final   Influenza B by PCR NEGATIVE NEGATIVE Final    Comment: (NOTE) The Xpert Xpress SARS-CoV-2/FLU/RSV plus assay is intended as an aid in the diagnosis of influenza from Nasopharyngeal swab specimens and should not be used as a sole basis for treatment. Nasal washings and aspirates are unacceptable for Xpert Xpress SARS-CoV-2/FLU/RSV testing.  Fact Sheet for Patients: BloggerCourse.com  Fact Sheet for Healthcare Providers: SeriousBroker.it  This test is not yet approved or cleared by the [place] FDA and has been authorized for detection and/or diagnosis of SARS-CoV-2 by FDA under an Emergency Use Authorization (EUA). This EUA will remain in effect (meaning this test can be used) for the duration of the COVID-19 declaration under Section 564(b)(1) of the Act, 21 U.S.C. section 360bbb-3(b)(1), unless the authorization is terminated or revoked.  Performed at [facility], [address], [place], [place] [address]   MRSA Next Gen by PCR, Nasal     Status: None   Collection Time: 02/22/21 11:27 PM   Specimen: Nasal Mucosa; Nasal Swab  Result Value Ref Range Status   MRSA by PCR Next Gen NOT DETECTED NOT DETECTED Final    Comment: (NOTE) The GeneXpert MRSA Assay (FDA approved for NASAL specimens only), is one component of a comprehensive MRSA colonization surveillance program. It is not intended to diagnose MRSA infection nor to guide or monitor treatment for MRSA infections. Test  performance is not FDA approved in patients less than 26 years old. Performed at [facility], [address], [place], [place] [address]   Culture, blood (routine x 2)     Status: None (Preliminary result)   Collection Time: 02/23/21 12:47 AM   Specimen: BLOOD  Result Value Ref Range Status   Specimen Description   Final    BLOOD BLOOD RIGHT HAND Performed at [facility], [address] [place], [place] [address]    Special Requests   Final    BOTTLES DRAWN AEROBIC ONLY Blood Culture adequate volume Performed at [facility], [address], [place], [place] [address]    Culture   Final    NO GROWTH 3 DAYS Performed at [facility], [address] [place], [place] [address]    Report Status PENDING  Incomplete  Culture, blood (routine x 2)     Status: None (Preliminary result)   Collection Time: 02/23/21 12:47 AM   Specimen: BLOOD  Result Value Ref Range Status   Specimen Description   Final    BLOOD BLOOD LEFT HAND Performed at [facility], [address] [place], [place] [address]    Special Requests   Final    BOTTLES DRAWN AEROBIC ONLY Blood Culture adequate volume Performed at [facility], [address], [place], [place] [address]    Culture   Final    NO GROWTH 3 DAYS Performed at [facility], [address], [place], [place] [address]    Report Status PENDING  Incomplete      Radiology Studies: ECHO TEE  Result Date: 02/25/2021    TRANSESOPHOGEAL ECHO REPORT   Patient Name:   Roy Ryan Date of Exam: 02/25/2021 Medical Rec #:  [id number]    Height:       74.0 in Accession #:    [id number]   Weight:       116.8  lb Date of Birth:  [birth year]     BSA:          1.730 m Patient Age:    9 years     BP:           91/61 mmHg Patient Gender: M            HR:           110 bpm. Exam Location:  Inpatient Procedure: Transesophageal Echo, 3D Echo, Color Doppler and  Cardiac Doppler Indications:     I34.1 Nonrheumatic mitral (valve) prolapse  History:         Patient has prior history of Echocardiogram examinations, most                  recent 02/23/2021. Arrythmias:Atrial Fibrillation; Risk                  Factors:ETOH Abuse.  Sonographer:     Irving Burton Senior RDCS Referring Phys:  [id number] Corrin Parker Diagnosing Phys: Thurmon Fair MD PROCEDURE: After discussion of the risks and benefits of a TEE, an informed consent was obtained from the patient. The transesophogeal probe was passed without difficulty through the esophogus of the patient. Local oropharyngeal anesthetic was provided with Cetacaine. Sedation performed by different physician. The patient was monitored while under deep sedation. Anesthestetic sedation was provided intravenously by Anesthesiology:  of Propofol,  of Lidocaine. The patient developed no complications during the procedure. IMPRESSIONS  1. Left ventricular ejection fraction, by estimation, is 60 to 65%. The left ventricle has normal function. The left ventricle has no regional wall motion abnormalities. Left ventricular diastolic function could not be evaluated.  2. Right ventricular systolic function is mildly reduced. The right ventricular size is normal. There is moderately elevated pulmonary artery systolic pressure.  3. Left atrial size was moderately dilated. No left atrial/left atrial appendage thrombus was detected.  4. Right atrial size was moderately dilated.  5. Moderate pleural effusion in the left lateral region.  6. There is severe holosystolic prolapse of both mitral leaflets, but this is particularly severe at the level of the middle (P2) scallop and medial (P3) scallop of the posterior leaflet. P3 may be flail. The vena contracta is 6 mm and the effective regurgitant area is 1.3 cm. The regurgitant volume is 130 ml, the regurgitant fraction is 80%. The maximum flail gap is 8 mm (at P3). MV area is 6 cm. The mitral valve  is myxomatous. Severe mitral valve regurgitation. The mean mitral valve gradient is 3.5 mmHg.  7. The tricuspid valve is myxomatous. Tricuspid valve regurgitation is mild to moderate.  8. The aortic valve is tricuspid. Aortic valve regurgitation is not visualized. No aortic stenosis is present.  9. There is severe spontaneous echo contrast seen in the ascending aorta, consistent with very low forward cardiac output. FINDINGS  Left Ventricle: Left ventricular ejection fraction, by estimation, is 60 to 65%. The left ventricle has normal function. The left ventricle has no regional wall motion abnormalities. The left ventricular internal cavity size was normal in size. There is  no left ventricular hypertrophy. Left ventricular diastolic function could not be evaluated due to atrial fibrillation. Left ventricular diastolic function could not be evaluated. Right Ventricle: The right ventricular size is normal. No increase in right ventricular wall thickness. Right ventricular systolic function is mildly reduced. There is moderately elevated pulmonary artery systolic pressure. The tricuspid regurgitant velocity is 3.06 m/s, and with an assumed right atrial pressure of  10 mmHg, the estimated right ventricular systolic pressure is 47.5 mmHg. Left Atrium: Left atrial size was moderately dilated. No left atrial/left atrial appendage thrombus was detected. Right Atrium: Right atrial size was moderately dilated. Pericardium: There is no evidence of pericardial effusion. Mitral Valve: There is severe holosystolic prolapse of both mitral leaflets, but this is particularly severe at the level of the middle (P2) scallop and medial (P3) scallop of the posterior leaflet. P3 may be flail. The vena contracta is 6 mm and the effective regurgitant area is 1.3 cm. The regurgitant volume is 130 ml, the regurgitant fraction is 80%. The maximum flail gap is 8 mm (at P3). MV area is 6 cm. The mitral valve is myxomatous. Severe mitral valve  regurgitation, with anteriorly-directed  jet. The mean mitral valve gradient is 3.5 mmHg with average heart rate of 120 bpm. Tricuspid Valve: The tricuspid valve is myxomatous. Tricuspid valve regurgitation is mild to moderate. There is mild prolapse of the tricuspid. Aortic Valve: The aortic valve is tricuspid. Aortic valve regurgitation is not visualized. No aortic stenosis is present. Pulmonic Valve: The pulmonic valve was normal in structure. Pulmonic valve regurgitation is mild. Aorta: There is severe spontaneous echo contrast seen in the ascending aorta, consistent with very low forward cardiac output. The aortic root, ascending aorta and aortic arch are all structurally normal, with no evidence of dilitation or obstruction. Venous: A pattern of systolic flow reversal, suggestive of severe mitral regurgitation is recorded from the right upper pulmonary vein. IAS/Shunts: No atrial level shunt detected by color flow Doppler. Additional Comments: There is a moderate pleural effusion in the left lateral region.  LEFT VENTRICLE PLAX 2D LVIDd:         4.60 cm LVIDs:         3.10 cm LVOT diam:     2.10 cm LV SV:         30 LV SV Index:   17 LVOT Area:     3.46 cm  LV Volumes (MOD) LV vol d, MOD A4C: 155.0 ml LV vol s, MOD A4C: 76.3 ml LV SV MOD A4C:     155.0 ml AORTIC VALVE LVOT Vmax:   77.40 cm/s LVOT Vmean:  50.000 cm/s LVOT VTI:    0.086 m MITRAL VALVE                   TRICUSPID VALVE MV Area (plan): 6.09 cm       TR Peak grad:   37.5 mmHg MV Mean grad:   3.5 mmHg       TR Vmax:        306.00 cm/s MR Peak grad:      72.6 mmHg MR Mean grad:      48.0 mmHg   SHUNTS MR Vmax:           426.00 cm/s Systemic VTI:  0.09 m MR Vmean:          327.0 cm/s  Systemic Diam: 2.10 cm MR Vena Contracta: 0.57 cm MR PISA:           17.82 cm MR PISA Eff ROA:   129 mm MR PISA Radius:    1.68 cm Mihai Croitoru MD Electronically signed by Thurmon Fair MD Signature Date/Time: 02/25/2021/11:02:17 AM    Final     Scheduled Meds:   (feeding supplement) PROSource Plus  30 mL Oral BID BM   amiodarone  200 mg Oral BID   amoxicillin-clavulanate  1 tablet Oral Q12H  Chlorhexidine Gluconate Cloth  6 each Topical Daily   [START ON 02/27/2021] cosyntropin  0.25 mg Intravenous Once   feeding supplement  1 Container Oral BID BM   feeding supplement  237 mL Oral Q24H   folic acid  1 mg Oral Daily   heparin injection (subcutaneous)  5,000 Units Subcutaneous Q8H   multivitamin with minerals  1 tablet Oral Daily   pantoprazole  40 mg Oral Daily   pneumococcal 23 valent vaccine  0.5 mL Intramuscular Tomorrow-1000   thiamine  100 mg Oral Daily   Or   thiamine  100 mg Intravenous Daily   Continuous Infusions:  sodium chloride 100 mL/hr at 02/26/21 1506   sodium chloride       LOS: 4 days   Rickey Barbara, MD [facility] Pager On Amion  If 7PM-7AM, please contact night-coverage 02/26/2021, 3:30 PM

## 2021-02-27 DIAGNOSIS — I4891 Unspecified atrial fibrillation: Secondary | ICD-10-CM | POA: Diagnosis not present

## 2021-02-27 DIAGNOSIS — F1010 Alcohol abuse, uncomplicated: Secondary | ICD-10-CM | POA: Diagnosis not present

## 2021-02-27 DIAGNOSIS — Z515 Encounter for palliative care: Secondary | ICD-10-CM

## 2021-02-27 DIAGNOSIS — I951 Orthostatic hypotension: Secondary | ICD-10-CM | POA: Diagnosis not present

## 2021-02-27 DIAGNOSIS — E860 Dehydration: Secondary | ICD-10-CM | POA: Diagnosis not present

## 2021-02-27 DIAGNOSIS — I340 Nonrheumatic mitral (valve) insufficiency: Secondary | ICD-10-CM | POA: Diagnosis not present

## 2021-02-27 DIAGNOSIS — Z7189 Other specified counseling: Secondary | ICD-10-CM

## 2021-02-27 LAB — COMPREHENSIVE METABOLIC PANEL
ALT: 15 U/L (ref 0–44)
AST: 45 U/L — ABNORMAL HIGH (ref 15–41)
Albumin: 2.5 g/dL — ABNORMAL LOW (ref 3.5–5.0)
Alkaline Phosphatase: 62 U/L (ref 38–126)
Anion gap: <3 — ABNORMAL LOW (ref 5–15)
BUN: 9 mg/dL (ref 8–23)
CO2: 22 mmol/L (ref 22–32)
Calcium: 7.9 mg/dL — ABNORMAL LOW (ref 8.9–10.3)
Chloride: 107 mmol/L (ref 98–111)
Creatinine, Ser: 0.86 mg/dL (ref 0.61–1.24)
GFR, Estimated: >60 mL/min (ref >60)
Glucose, Bld: 95 mg/dL (ref 70–99)
Potassium: 3.4 mmol/L — ABNORMAL LOW (ref 3.5–5.1)
Sodium: 131 mmol/L — ABNORMAL LOW (ref 135–145)
Total Bilirubin: 1.7 mg/dL — ABNORMAL HIGH (ref 0.3–1.2)
Total Protein: 5.4 g/dL — ABNORMAL LOW (ref 6.5–8.1)

## 2021-02-27 LAB — GLUCOSE, CAPILLARY
Glucose-Capillary: 103 mg/dL — ABNORMAL HIGH (ref 70–99)
Glucose-Capillary: 106 mg/dL — ABNORMAL HIGH (ref 70–99)
Glucose-Capillary: 109 mg/dL — ABNORMAL HIGH (ref 70–99)
Glucose-Capillary: 77 mg/dL (ref 70–99)
Glucose-Capillary: 89 mg/dL (ref 70–99)
Glucose-Capillary: 93 mg/dL (ref 70–99)

## 2021-02-27 LAB — CBC
HCT: 24.4 % — ABNORMAL LOW (ref 39.0–52.0)
Hemoglobin: 8.2 g/dL — ABNORMAL LOW (ref 13.0–17.0)
MCH: 29.4 pg (ref 26.0–34.0)
MCHC: 33.6 g/dL (ref 30.0–36.0)
MCV: 87.5 fL (ref 80.0–100.0)
Platelets: 79 10*3/uL — ABNORMAL LOW (ref 150–400)
RBC: 2.79 MIL/uL — ABNORMAL LOW (ref 4.22–5.81)
RDW: 17.1 % — ABNORMAL HIGH (ref 11.5–15.5)
WBC: 5.1 10*3/uL (ref 4.0–10.5)
nRBC: 0 % (ref 0.0–0.2)

## 2021-02-27 LAB — ACTH STIMULATION, 3 TIME POINTS
Cortisol, 30 Min: 6.5 ug/dL
Cortisol, 60 Min: 7.7 ug/dL
Cortisol, Base: 3.1 ug/dL

## 2021-02-27 MED ORDER — HYDROCORTISONE 5 MG PO TABS
5.0000 mg | ORAL_TABLET | Freq: Every day | ORAL | Status: DC
  Administered 2021-02-27 – 2021-03-02 (×4): 5 mg via ORAL
  Filled 2021-02-27 (×4): qty 1

## 2021-02-27 NOTE — Progress Notes (Deleted)
Cardiology Office Note:    Date:  02/27/2021   ID:  Roy Ryan, DOB 09/20/1950, MRN [id number]  PCP:  Patient, No Pcp Per (Inactive)  Cardiologist:  Nanetta Batty, MD  Electrophysiologist:  None   Referring MD: No ref. provider found   Chief Complaint: hospital follow-up for atrial fibrillation  History of Present Illness:    Roy Ryan is a 70 y.o. male with a history of paroxsymal atrial fibrillation/flutter not on anticoagulation due to noncompliance and alcohol abuse, mitral valve prolapse with severe mitral regurgitation, adrenal insufficiency on steroids, subclinical hypothyroidism, pulmonary hypertension, alcohol abuse, and homelessness who is a patient of Dr. Allyson Sabal and presents today for hospital follow-up of atrial fibrillation.  Patient was first seen by Cardiology Service (Dr. Allyson Sabal) in 01/2020 for evaluation of chest pain and dyspnea on exertion during an admission for pneumonia. Also noted to be in new onset atrial flutter. Echo showed LVEF of 50-55%, moderately reduced RV systolic function with moderate pulmonary hypertension, and mitral valve prolapse with moderate MR. He was note started on anticoagulation given history of alcohol abuse and CHA2DS2-VASc score of 1. He completed the resting images for a Lexiscan Myoview refused to proceed with the stress portion of the test and ended up leaving AMA. Outpatient follow-up was arranged but patient was lost to follow-up.   Patient recently readmitted from 02/22/2021 to *** for altered mental status. He was found to be severe hypoglycemic with CBG of 37, hypotension, and hypothermic and was placed on [name] Also found to be in atrial fibrillation with RVR. He was admitted with severe sepsis; however, no clear source of infection was identified. He had persistent hypotension requiring pressors and was ultimately diagnosed with adrenal insufficiency. He was  started on steroids with improvement in BP. Situation complicated by very  poor social situation. Patient is homeless and was living in a motel. Per EMS report, his living conditions were very poor with feces and urine in the bed, rotting food, and multiple alcohol bottles. He reportedly had not had any food to eat or water to drink at his motel for a while (unclear how long).  Hospitalization complicated by multiple compression fractures of cervical and thoracic, alcohol abuse with withdrawals, subclinical hypothyroidism started on Synthroid, toxic metabolic encephalopathy, hypoglycemia requiring dextrose infusion, acute hypoxic respiratory failure intermittent requiring BiPAP, rapid atrial fibrillation, and mitral valve prolapse with severe MR. In regards to cardiac issues, he was started on Amiodarone for his atrial fibrillation given hypotension. He was not felt to be an anticoagulation candidate given poor social situation with high likelihood of not follow-up, non-compliance, and ETOH abuse. TTE showed LVEF of 50% with normal wall motion, mildly enlarged RV with moderately reduced systolic function, biatrial enlargement, mitral valve prolapse with possible flail leaflet and severe mitral regurgitation, moderate  tricuspid  regurgitation,  and mild  aortic regurgitation. TEE showed severe holosystolic prolapse of  both mitral leaflets particularly severe at the level of the middle P2 scallop and medal P3 of the posterior leaflet (P3 may be flail) with severe mitral regurgitation. He was felt to not be a candidate for surgery due to severe malnutrition and cachexia. Palliative Care was recommended. Cardiology signed off on 02/25/2021. He was seen by Palliative Care and ***. He was discharged on *** to SNF.   Patient presents today for hospital follow-up. ***  Persistent Atrial Fibrillation/Flutter - Recently had a prolonged hospitalization for hypotension and hypoglycemia secondary to adrenal insufficiency. Also found to be in atrial fibrillation at  that time. Previously has had  atrial flutter.  - *** - Continue Amiodarone ***. Not great long-term option with known alcohol abuse. but options are limited.  - CHA2DS2-VASc = 1 (age). Not a long-term anticoagulation candidate given poor living situation high risk for not following up, noncompliance, and alcohol abuse.  Mitral Valve Prolapse with Severe MR - TEE during recent admission showed normal LV function with severe holosystolic prolapse of  both mitral leaflets particularly severe at the level of the middle P2 scallop and medal P3 of the posterior leaflet (P3 may be flail) with severe mitral regurgitation. - Not felt to be a surgical candidate given severe malnutrition and cachexia. Palliative Care was recommended.  Past Medical History:  Diagnosis Date   Alcohol abuse    Atrial flutter (HCC)    Mitral regurgitation    Seizures (HCC)     Past Surgical History:  Procedure Laterality Date   TEE WITHOUT CARDIOVERSION N/A 02/25/2021   Procedure: TRANSESOPHAGEAL ECHOCARDIOGRAM (TEE);  Surgeon: Thurmon Fairroitoru, Mihai, MD;  [facility];  Service: Cardiovascular;  Laterality: N/A;    Current Medications: No outpatient medications have been marked as taking for the 03/11/21 encounter (Appointment) with Corrin ParkerGoodrich, Jaxiel Kines E, PA-C.     Allergies:   Patient has no known allergies.   Social History   Socioeconomic History   Marital status: Single    Spouse name: Not on file   Number of children: Not on file   Years of education: Not on file   Highest education level: Not on file  Occupational History   Not on file  Tobacco Use   Smoking status: Never   Smokeless tobacco: Never  Substance and Sexual Activity   Alcohol use: Never   Drug use: Never   Sexual activity: Not on file  Other Topics Concern   Not on file  Social History Narrative   Not on file   Social Determinants of Health   Financial Resource Strain: Not on file  Food Insecurity: Not on file  Transportation Needs: Not on file  Physical  Activity: Not on file  Stress: Not on file  Social Connections: Not on file     Family History: The patient's ***family history is negative for CAD, Heart failure, and Stroke.  ROS:   Please see the history of present illness.    *** All other systems reviewed and are negative.  EKGs/Labs/Other Studies Reviewed:    The following studies were reviewed today:  TTE 02/23/2021: Impressions:  1. 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 Very eccentric,  anteriorly directed jet of mitral regurgitation. P2 scallop  prolapse/possible flail noted on 3D images.. The mitral valve is  myxomatous. Severe mitral valve regurgitation. No  evidence of mitral stenosis.   2. Left ventricular ejection fraction, by estimation, is 50%. Left  ventricular ejection fraction by 3D volume is 53 %. The left ventricle has  mildly decreased function. The left ventricle has no regional wall motion  abnormalities. There is mild left  ventricular hypertrophy. Left ventricular diastolic parameters are  indeterminate.   3. Right ventricular systolic function is moderately reduced. The right  ventricular size is mildly enlarged. There is moderately elevated  pulmonary artery systolic pressure. The estimated right ventricular  systolic pressure is 52.2 mmHg.   4. Left atrial size was severely dilated.   5. Right atrial size was moderately dilated.   6. Tricuspid valve regurgitation is moderate.   7. The aortic valve is tricuspid. There is mild thickening of  the aortic  valve. Aortic valve regurgitation is mild. No aortic stenosis is present.   8. Aortic dilatation noted. There is borderline dilatation of the aortic  root, measuring 39 mm.   9. The inferior vena cava is dilated in size with <50% respiratory  variability, suggesting right atrial pressure of 15 mmHg.   Conclusion(s)/Recommendation(s): Recommend TEE for further evaluation of  mitral valve prolapse.  _______________  TEE  02/25/2021: Impressions: 1. Left ventricular ejection fraction, by estimation, is 60 to 65%. The  left ventricle has normal function. The left ventricle has no regional  wall motion abnormalities. Left ventricular diastolic function could not  be evaluated.   2. Right ventricular systolic function is mildly reduced. The right  ventricular size is normal. There is moderately elevated pulmonary artery  systolic pressure.   3. Left atrial size was moderately dilated. No left atrial/left atrial  appendage thrombus was detected.   4. Right atrial size was moderately dilated.   5. Moderate pleural effusion in the left lateral region.   6. There is severe holosystolic prolapse of both mitral leaflets, but  this is particularly severe at the level of the middle (P2) scallop and  medial (P3) scallop of the posterior leaflet. P3 may be flail. The vena  contracta is 6 mm and the effective  regurgitant area is 1.3 cm. The regurgitant volume is 130 ml, the  regurgitant fraction is 80%. The maximum flail gap is 8 mm (at P3). MV  area is 6 cm. The mitral valve is myxomatous. Severe mitral valve  regurgitation. The mean mitral valve gradient is  3.5 mmHg.   7. The tricuspid valve is myxomatous. Tricuspid valve regurgitation is  mild to moderate.   8. The aortic valve is tricuspid. Aortic valve regurgitation is not  visualized. No aortic stenosis is present.   9. There is severe spontaneous echo contrast seen in the ascending aorta,  consistent with very low forward cardiac output.  EKG:  EKG ordered today. EKG personally reviewed and demonstrates ***.  Recent Labs: 02/22/2021: TSH 7.133 02/25/2021: Magnesium 1.7 02/27/2021: ALT 15; BUN 9; Creatinine, Ser 0.86; Hemoglobin 8.2; Platelets 79; Potassium 3.4; Sodium 131  Recent Lipid Panel    Component Value Date/Time   CHOL 107 01/31/2020 0930   TRIG 29 01/31/2020 0930   HDL 43 01/31/2020 0930   CHOLHDL 2.5 01/31/2020 0930   VLDL 6 01/31/2020  0930   LDLCALC 58 01/31/2020 0930    Physical Exam:    Vital Signs: There were no vitals taken for this visit.    Wt Readings from Last 3 Encounters:  02/22/21 116 lb 13.5 oz (53 kg)  02/01/20 135 lb 12.8 oz (61.6 kg)     General: 70 y.o. male in no acute distress. HEENT: Normocephalic and atraumatic. Sclera clear. EOMs intact. Neck: Supple. No carotid bruits. No JVD. Heart: *** RRR. Distinct S1 and S2. No murmurs, gallops, or rubs. Radial and distal pedal pulses 2+ and equal bilaterally. Lungs: No increased work of breathing. Clear to ausculation bilaterally. No wheezes, rhonchi, or rales.  Abdomen: Soft, non-distended, and non-tender to palpation. Bowel sounds present in all 4 quadrants.  MSK: Normal strength and tone for age. *** Extremities: No lower extremity edema.    Skin: Warm and dry. Neuro: Alert and oriented x3. No focal deficits. Psych: Normal affect. Responds appropriately.   Assessment:    No diagnosis found.  Plan:     Disposition: Follow up in ***   Medication Adjustments/Labs and Tests  Ordered: Current medicines are reviewed at length with the patient today.  Concerns regarding medicines are outlined above.  No orders of the defined types were placed in this encounter.  No orders of the defined types were placed in this encounter.   There are no Patient Instructions on file for this visit.   Signed, Corrin Parker, PA-C  02/27/2021 3:27 PM    [facility]

## 2021-02-27 NOTE — Progress Notes (Signed)
PROGRESS NOTE    Tin Engram  GNF:[id number] DOB: 1950-10-24 DOA: 02/22/2021 PCP: Patient, No Pcp Per (Inactive)    Brief Narrative:  70 year old male with medical history of alcohol abuse, alcohol withdrawal seizures, atypical flutter, pulmonary hypertension presented to ED via EMS for evaluation of altered mental status.  Patient currently residing at hotel and was found to be hypoglycemic with CBG 37.  EMS reported poor living conditions, feces and urine bed, rotting food, multiple alcohol bottles.  Blood pressure was soft was given IV fluid bolus.  In the ED he was hypothermic with rectal temperature 95.2.  He was placed on [name]  Initially placed on empiric antibiotics for severe sepsis however no clear source of infection identified.  Due to persistent hypotension PCCM was consulted, found to have very low cortisol 2.1, cosyntropin stim test ordered for adrenal insufficiency.  He received IV fluid boluses, noted on pressor support.  Patient also found to be in A. fib with RVR.  Cardiology consulted.  Chest x-ray also showed progression fractures at C5-6-7   Assessment & Plan:   Principal Problem:   Hypotension Active Problems:   Atrial fibrillation with rapid ventricular response (HCC)   Hypomagnesemia   Hypoglycemia   Hypothermia   Protein-calorie malnutrition, severe   Nonrheumatic mitral valve regurgitation  Toxic metabolic encephalopathy -Noted to be back to baseline, appropriately conversant -EEG showed moderate diffuse encephalopathy, no seizures or epileptiform discharges seen -CT head was unremarkable, showed chronic right maxillary sinus disease, air-fluid in the left frontal sinus may represent acute or chronic sinusitis -Recent B12 2040, ammonia 22 -UDS noted to be neg   Hypotension/adrenal insufficiency -Likely due to adrenal insufficiency -Cortisone stimulation test was done, reviewed, confirms adrenal insufficiency -Also found to be in A. fib with RVR -No  infectious source identified; chest x-ray showed no acute cardiopulmonary disease, lactic acid 1.5, procalcitonin less than 0.10, WBC 4.3, UA was clear -Was started on empiric antibiotics, PCCM was also consulted -Did not require pressor support, bp currently stable -Start on scheduled hydrocortisone   Thoracic compression fracture  -Initial chest x-ray chest x-ray showed progression fractures at C5-6-7 -Noted to have compression fractures at T5-T6 and T7 in thoracic spine which are stable from CT scan from 01/01/2020.  Without evidence of progression   New onset atrial fibrillation with RVR -Started on apixaban 5 mg twice a day; CHA2DS2VASc score of 1 -Cardiology initially planned for TEE/DCCV, however given hx noncompliance and social issues, not candidate for surgery    Alcohol abuse -Had been continued on CIWA protocol -Continue thiamine   Subclinical hypothyroidism -TSH 7.133, T4 1.21 -Will need repeat outpatient thyroid function test in 6 to 8 weeks   Hypomagnesemia -Cont to replace lytes as needed -cont to follow electrolytes and replace as needed   Hyperbilirubinemia -Total bilirubin 3.5, improved to 3.1 this morning -Intact bilirubin is elevated at 1.9, direct bilirubin 1.2 -Unclear etiology, likely shock liver from hypotension -Would continue to follow LFT's   Hypokalemia -Continue to replace lytes as needed   Sinusitis -Seen on CT head -Currently on Augmentin   Alcohol abuse -Without evidence of alcohol withdrawal -Continue with CIWA protocol   Homeless issues -Social work consulted   Pancytopenia -Mild; likely from chronic alcohol abuse  Mitral regurg with prolapse  -Pt is now s/p TEE with evidence of severe MR with a component of acute MR likely present. Noted to be a poor candidate for surgical repair -Cardiology has recommended f/u with Palliative Care to establish goals  of care -Palliative Care had been consulted  DVT prophylaxis: Heparin subq Code  Status: Full Family Communication: Pt in room, family not at bedside  Status is: Inpatient  Remains inpatient appropriate because:Inpatient level of care appropriate due to severity of illness  Dispo: The patient is from: Home              Anticipated d/c is to: Home              Patient currently is not medically stable to d/c.   Difficult to place patient No  Consultants:  PCCM Cardiology Palliative Care  Procedures:    Antimicrobials: Anti-infectives (From admission, onward)    Start     Dose/Rate Route Frequency Ordered Stop   02/24/21 1800  amoxicillin-clavulanate (AUGMENTIN) 875-125 MG per tablet 1 tablet        1 tablet Oral Every 12 hours 02/24/21 0949 03/01/21 2359   02/24/21 0000  vancomycin (VANCOCIN) IVPB 1000 mg/200 mL premix  Status:  Discontinued        1,000 mg 200 mL/hr over 60 Minutes Intravenous Every 24 hours 02/23/21 0022 02/24/21 0949   02/23/21 0115  piperacillin-tazobactam (ZOSYN) IVPB 3.375 g  Status:  Discontinued        3.375 g 12.5 mL/hr over 240 Minutes Intravenous Every 8 hours 02/23/21 0015 02/24/21 0949   02/23/21 0115  vancomycin (VANCOCIN) IVPB 1000 mg/200 mL premix        1,000 mg 200 mL/hr over 60 Minutes Intravenous  Once 02/23/21 0015 02/23/21 0151       Subjective: No complaints this AM  Objective: Vitals:   02/26/21 2122 02/27/21 0353 02/27/21 1219 02/27/21 1317  BP: 97/66 (!) 91/55 100/65 (!) 88/60  Pulse: 74 (!) 59 71 67  Resp: 20 16 15    Temp: 97.6 F (36.4 C) 98.6 F (37 C) 98 F (36.7 C)   TempSrc: Oral Oral Oral   SpO2: 99% 99% 98%   Weight:      Height:        Intake/Output Summary (Last 24 hours) at 02/27/2021 1506 Last data filed at 02/27/2021 1048 Gross per 24 hour  Intake 354 ml  Output 450 ml  Net -96 ml    Filed Weights   02/22/21 2308  Weight: 53 kg    Examination: General exam: Conversant, in no acute distress Respiratory system: normal chest rise, clear, no audible wheezing Cardiovascular  system: regular rhythm, s1-s2 Gastrointestinal system: Nondistended, nontender, pos BS Central nervous system: No seizures, no tremors Extremities: No cyanosis, no joint deformities Skin: No rashes, no pallor Psychiatry: Affect normal // no auditory hallucinations   Data Reviewed: I have personally reviewed following labs and imaging studies  CBC: Recent Labs  Lab 02/22/21 1310 02/23/21 0937 02/24/21 0303 02/25/21 0357 02/26/21 0531 02/27/21 0836  WBC 3.0* 4.3 2.3* 4.7 6.3 5.1  NEUTROABS 1.3*  --   --   --   --   --   HGB 10.3* 10.5* 9.8* 9.3* 9.1* 8.2*  HCT 31.9* 32.5* 29.8* 28.0* 28.0* 24.4*  MCV 88.9 87.8 86.1 87.5 88.3 87.5  PLT 92* 101* 97* 97* 82* 79*    Basic Metabolic Panel: Recent Labs  Lab 02/22/21 1310 02/23/21 0047 02/23/21 0937 02/24/21 0303 02/25/21 0357 02/25/21 1750 02/27/21 0836  NA 133*  --  133* 134* 135  --  131*  K 4.5  --  3.2* 4.6 4.2  --  3.4*  CL 97*  --  105 107 108  --  107  CO2 22  --  20* 20* 19*  --  22  GLUCOSE 250*  --  114* 315* 156*  --  95  BUN 5*  --  <5* <5* 6*  --  9  CREATININE 0.83 0.71 0.69 0.86 0.92  --  0.86  CALCIUM 8.5*  --  8.7* 8.6* 8.7*  --  7.9*  MG  --  1.6*  --  2.0  --  1.7  --   PHOS  --  2.5  --   --   --   --   --     GFR: Estimated Creatinine Clearance: 62.5 mL/min (by C-G formula based on SCr of 0.86 mg/dL). Liver Function Tests: Recent Labs  Lab 02/22/21 1310 02/23/21 0937 02/24/21 0303 02/25/21 0357 02/27/21 0836  AST 31  --  22 47* 45*  ALT 16  --  ALKPHOS 75  --  78 67 62  BILITOT 3.5* 3.1* 2.0* 1.5* 1.7*  PROT 6.5  --  6.5 6.2* 5.4*  ALBUMIN 3.0*  --  2.9* 2.9* 2.5*    No results for input(s): LIPASE, AMYLASE in the last 168 hours. Recent Labs  Lab 02/22/21 1310  AMMONIA 22    Coagulation Profile: No results for input(s): INR, PROTIME in the last 168 hours. Cardiac Enzymes: No results for input(s): CKTOTAL, CKMB, CKMBINDEX, TROPONINI in the last 168 hours. BNP (last 3  results) No results for input(s): PROBNP in the last 8760 hours. HbA1C: No results for input(s): HGBA1C in the last 72 hours. CBG: Recent Labs  Lab 02/26/21 2126 02/27/21 0002 02/27/21 0356 02/27/21 0721 02/27/21 1136  GLUCAP 111* 93 89 109* 77    Lipid Profile: No results for input(s): CHOL, HDL, LDLCALC, TRIG, CHOLHDL, LDLDIRECT in the last 72 hours. Thyroid Function Tests: No results for input(s): TSH, T4TOTAL, FREET4, T3FREE, THYROIDAB in the last 72 hours.  Anemia Panel: No results for input(s): VITAMINB12, FOLATE, FERRITIN, TIBC, IRON, RETICCTPCT in the last 72 hours.  Sepsis Labs: Recent Labs  Lab 02/22/21 1605 02/23/21 0047  PROCALCITON  --  <0.10  LATICACIDVEN 1.2 1.5     Recent Results (from the past 240 hour(s))  Resp Panel by RT-PCR (Flu A&B, Covid) Nasopharyngeal Swab     Status: None   Collection Time: 02/22/21  5:04 PM   Specimen: Nasopharyngeal Swab; Nasopharyngeal(NP) swabs in vial transport medium  Result Value Ref Range Status   SARS Coronavirus 2 by RT PCR NEGATIVE NEGATIVE Final    Comment: (NOTE) SARS-CoV-2 target nucleic acids are NOT DETECTED.  The SARS-CoV-2 RNA is generally detectable in upper respiratory specimens during the acute phase of infection. The lowest concentration of SARS-CoV-2 viral copies this assay can detect is 138 copies/mL. A negative result does not preclude SARS-Cov-2 infection and should not be used as the sole basis for treatment or other patient management decisions. A negative result may occur with  improper specimen collection/handling, submission of specimen other than nasopharyngeal swab, presence of viral mutation(s) within the areas targeted by this assay, and inadequate number of viral copies(<138 copies/mL). A negative result must be combined with clinical observations, patient history, and epidemiological information. The expected result is Negative.  Fact Sheet for Patients:   BloggerCourse.com  Fact Sheet for Healthcare Providers:  SeriousBroker.it  This test is no t yet approved or cleared by the [place] FDA and  has been authorized for detection and/or diagnosis of SARS-CoV-2 by FDA under an Emergency Use Authorization (  EUA). This EUA will remain  in effect (meaning this test can be used) for the duration of the COVID-19 declaration under Section 564(b)(1) of the Act, 21 U.S.C.section 360bbb-3(b)(1), unless the authorization is terminated  or revoked sooner.       Influenza A by PCR NEGATIVE NEGATIVE Final   Influenza B by PCR NEGATIVE NEGATIVE Final    Comment: (NOTE) The Xpert Xpress SARS-CoV-2/FLU/RSV plus assay is intended as an aid in the diagnosis of influenza from Nasopharyngeal swab specimens and should not be used as a sole basis for treatment. Nasal washings and aspirates are unacceptable for Xpert Xpress SARS-CoV-2/FLU/RSV testing.  Fact Sheet for Patients: BloggerCourse.com  Fact Sheet for Healthcare Providers: SeriousBroker.it  This test is not yet approved or cleared by the [place] FDA and has been authorized for detection and/or diagnosis of SARS-CoV-2 by FDA under an Emergency Use Authorization (EUA). This EUA will remain in effect (meaning this test can be used) for the duration of the COVID-19 declaration under Section 564(b)(1) of the Act, 21 U.S.C. section 360bbb-3(b)(1), unless the authorization is terminated or revoked.  Performed at [facility], [address] [place], [place] [address]   MRSA Next Gen by PCR, Nasal     Status: None   Collection Time: 02/22/21 11:27 PM   Specimen: Nasal Mucosa; Nasal Swab  Result Value Ref Range Status   MRSA by PCR Next Gen NOT DETECTED NOT DETECTED Final    Comment: (NOTE) The GeneXpert MRSA Assay (FDA approved for NASAL specimens only), is one  component of a comprehensive MRSA colonization surveillance program. It is not intended to diagnose MRSA infection nor to guide or monitor treatment for MRSA infections. Test performance is not FDA approved in patients less than 17 years old. Performed at [facility], [address] [place], [place] [address]   Culture, blood (routine x 2)     Status: None (Preliminary result)   Collection Time: 02/23/21 12:47 AM   Specimen: BLOOD  Result Value Ref Range Status   Specimen Description   Final    BLOOD BLOOD RIGHT HAND Performed at [facility], [address], [place], [place] [address]    Special Requests   Final    BOTTLES DRAWN AEROBIC ONLY Blood Culture adequate volume Performed at [facility], [address], [place], [place] [address]    Culture   Final    NO GROWTH 4 DAYS Performed at [facility], [address], [place], [place] [address]    Report Status PENDING  Incomplete  Culture, blood (routine x 2)     Status: None (Preliminary result)   Collection Time: 02/23/21 12:47 AM   Specimen: BLOOD  Result Value Ref Range Status   Specimen Description   Final    BLOOD BLOOD LEFT HAND Performed at [facility], [address] [place], [place] [address]    Special Requests   Final    BOTTLES DRAWN AEROBIC ONLY Blood Culture adequate volume Performed at [facility], [address], [place], [place] [address]    Culture   Final    NO GROWTH 4 DAYS Performed at [facility], [address] [place], [place] [address]    Report Status PENDING  Incomplete      Radiology Studies: No results found.  Scheduled Meds:  (feeding supplement) PROSource Plus  30 mL Oral BID BM   amiodarone  200 mg Oral  BID   amoxicillin-clavulanate  1 tablet Oral Q12H   Chlorhexidine Gluconate Cloth  6 each Topical Daily   feeding supplement  1 Container Oral BID BM   feeding  supplement  237 mL Oral Q24H   folic acid  1 mg Oral Daily   heparin injection (subcutaneous)  5,000 Units Subcutaneous Q8H   hydrocortisone  5 mg Oral Daily   multivitamin with minerals  1 tablet Oral Daily   pantoprazole  40 mg Oral Daily   pneumococcal 23 valent vaccine  0.5 mL Intramuscular Tomorrow-1000   thiamine  100 mg Oral Daily   Or   thiamine  100 mg Intravenous Daily   Continuous Infusions:  sodium chloride 100 mL/hr at 02/27/21 1200   sodium chloride       LOS: 5 days   Rickey Barbara, MD [facility] Pager On Amion  If 7PM-7AM, please contact night-coverage 02/27/2021, 3:06 PM

## 2021-02-28 DIAGNOSIS — F1010 Alcohol abuse, uncomplicated: Secondary | ICD-10-CM | POA: Diagnosis not present

## 2021-02-28 DIAGNOSIS — I4891 Unspecified atrial fibrillation: Secondary | ICD-10-CM | POA: Diagnosis not present

## 2021-02-28 LAB — GLUCOSE, CAPILLARY
Glucose-Capillary: 106 mg/dL — ABNORMAL HIGH (ref 70–99)
Glucose-Capillary: 110 mg/dL — ABNORMAL HIGH (ref 70–99)
Glucose-Capillary: 117 mg/dL — ABNORMAL HIGH (ref 70–99)
Glucose-Capillary: 118 mg/dL — ABNORMAL HIGH (ref 70–99)
Glucose-Capillary: 126 mg/dL — ABNORMAL HIGH (ref 70–99)
Glucose-Capillary: 99 mg/dL (ref 70–99)
Glucose-Capillary: 99 mg/dL (ref 70–99)

## 2021-02-28 LAB — COMPREHENSIVE METABOLIC PANEL
ALT: 16 U/L (ref 0–44)
AST: 44 U/L — ABNORMAL HIGH (ref 15–41)
Albumin: 2.8 g/dL — ABNORMAL LOW (ref 3.5–5.0)
Alkaline Phosphatase: 64 U/L (ref 38–126)
Anion gap: 6 (ref 5–15)
BUN: 11 mg/dL (ref 8–23)
CO2: 18 mmol/L — ABNORMAL LOW (ref 22–32)
Calcium: 8.2 mg/dL — ABNORMAL LOW (ref 8.9–10.3)
Chloride: 110 mmol/L (ref 98–111)
Creatinine, Ser: 0.63 mg/dL (ref 0.61–1.24)
GFR, Estimated: >60 mL/min (ref >60)
Glucose, Bld: 94 mg/dL (ref 70–99)
Potassium: 3.8 mmol/L (ref 3.5–5.1)
Sodium: 134 mmol/L — ABNORMAL LOW (ref 135–145)
Total Bilirubin: 1.4 mg/dL — ABNORMAL HIGH (ref 0.3–1.2)
Total Protein: 6.2 g/dL — ABNORMAL LOW (ref 6.5–8.1)

## 2021-02-28 LAB — CBC
HCT: 25.3 % — ABNORMAL LOW (ref 39.0–52.0)
Hemoglobin: 8.6 g/dL — ABNORMAL LOW (ref 13.0–17.0)
MCH: 28.9 pg (ref 26.0–34.0)
MCHC: 34 g/dL (ref 30.0–36.0)
MCV: 84.9 fL (ref 80.0–100.0)
Platelets: 91 10*3/uL — ABNORMAL LOW (ref 150–400)
RBC: 2.98 MIL/uL — ABNORMAL LOW (ref 4.22–5.81)
RDW: 16.9 % — ABNORMAL HIGH (ref 11.5–15.5)
WBC: 5 10*3/uL (ref 4.0–10.5)
nRBC: 0 % (ref 0.0–0.2)

## 2021-02-28 LAB — CULTURE, BLOOD (ROUTINE X 2)
Culture: NO GROWTH
Culture: NO GROWTH
Special Requests: ADEQUATE
Special Requests: ADEQUATE

## 2021-02-28 MED ORDER — SODIUM CHLORIDE 0.9% FLUSH: 10.0000 mL | INTRAVENOUS | Status: DC | PRN

## 2021-02-28 MED ORDER — SODIUM CHLORIDE 0.9% FLUSH
10.0000 mL | Freq: Two times a day (BID) | INTRAVENOUS | Status: DC
  Administered 2021-02-28 – 2021-03-02 (×5): 10 mL
  Administered 2021-03-03: 20 mL
  Administered 2021-03-03 – 2021-03-13 (×12): 10 mL

## 2021-02-28 NOTE — Progress Notes (Signed)
PROGRESS NOTE    Steen Bisig  NWG:[id number] DOB: [birth year] DOA: 02/22/2021 PCP: Patient, No Pcp Per (Inactive)    Brief Narrative:  70 year old male with medical history of alcohol abuse, alcohol withdrawal seizures, atypical flutter, pulmonary hypertension presented to ED via EMS for evaluation of altered mental status.  Patient currently residing at hotel and was found to be hypoglycemic with CBG 37.  EMS reported poor living conditions, feces and urine bed, rotting food, multiple alcohol bottles.  Blood pressure was soft was given IV fluid bolus.  In the ED he was hypothermic with rectal temperature 95.2.  He was placed on [name]  Initially placed on empiric antibiotics for severe sepsis however no clear source of infection identified.  Due to persistent hypotension PCCM was consulted, found to have very low cortisol 2.1, cosyntropin stim test ordered for adrenal insufficiency.  He received IV fluid boluses, noted on pressor support.  Patient also found to be in A. fib with RVR.  Cardiology consulted.  Chest x-ray also showed progression fractures at C5-6-7   Assessment & Plan:   Principal Problem:   Hypotension Active Problems:   Atrial fibrillation with rapid ventricular response (HCC)   Hypomagnesemia   Hypoglycemia   Hypothermia   Protein-calorie malnutrition, severe   Nonrheumatic mitral valve regurgitation  Toxic metabolic encephalopathy -Noted to be back to baseline, appropriately conversant -EEG showed moderate diffuse encephalopathy, no seizures or epileptiform discharges seen -CT head was unremarkable, showed chronic right maxillary sinus disease, air-fluid in the left frontal sinus may represent acute or chronic sinusitis -Recent B12 2040, ammonia 22 -UDS noted to be neg -Stable this morning   Hypotension/adrenal insufficiency -Likely due to adrenal insufficiency -Cortisone stimulation test was done, reviewed, confirms adrenal insufficiency -Also found to be in  A. fib with RVR -No infectious source identified; chest x-ray showed no acute cardiopulmonary disease, lactic acid 1.5, procalcitonin less than 0.10, WBC 4.3, UA was clear -Was started on empiric antibiotics, PCCM was also consulted -Did not require pressor support, bp currently stable -Now on daily hydrocortisone, blood pressure seems improved   Thoracic compression fracture  -Initial chest x-ray chest x-ray showed progression fractures at C5-6-7 -Noted to have compression fractures at T5-T6 and T7 in thoracic spine which are stable from CT scan from 01/01/2020.  Without evidence of progression   New onset atrial fibrillation with RVR -Started on apixaban 5 mg twice a day; CHA2DS2VASc score of 1 -Cardiology initially planned for TEE/DCCV, however given hx noncompliance and social issues, patient is not a candidate for surgery   Alcohol abuse -Had been continued on CIWA protocol -Continue thiamine   Subclinical hypothyroidism -TSH 7.133, T4 1.21 -Recommend repeat outpatient thyroid function test in 6 to 8 weeks   Hypomagnesemia -Cont to replace lytes as needed -cont to follow electrolytes and replace as needed   Hyperbilirubinemia -Total bilirubin 3.5, improved to 3.1 this morning -Intact bilirubin is elevated at 1.9, direct bilirubin 1.2 -Unclear etiology, likely shock liver from hypotension -Would continue to follow LFT's   Hypokalemia -Continue to replace lytes as needed   Sinusitis -Seen on CT head -Currently on Augmentin   Alcohol abuse -Without evidence of alcohol withdrawal -Patient had been continued with CIWA protocol   Homeless issues -TOC following   Pancytopenia -Mild; likely from chronic alcohol abuse  Mitral regurg with prolapse  -Pt is now s/p TEE with evidence of severe MR with a component of acute MR likely present. Noted to be a poor candidate for surgical repair -  Cardiology has recommended f/u with Palliative Care to establish goals of  care -Appreciate assistance by palliative care.  Unfortunately, at this time patient does not seem to want to engage in discussing his goals of care.  Recommendations for outpatient palliative care should patient discharge  DVT prophylaxis: Heparin subq Code Status: Full Family Communication: Pt in room, family not at bedside  Status is: Inpatient  Remains inpatient appropriate because:Inpatient level of care appropriate due to severity of illness  Dispo: The patient is from: Home              Anticipated d/c is to: Home              Patient currently is not medically stable to d/c.   Difficult to place patient No  Consultants:  PCCM Cardiology Palliative Care  Procedures:    Antimicrobials: Anti-infectives (From admission, onward)    Start     Dose/Rate Route Frequency Ordered Stop   02/24/21 1800  amoxicillin-clavulanate (AUGMENTIN) 875-125 MG per tablet 1 tablet        1 tablet Oral Every 12 hours 02/24/21 0949 03/01/21 2359   02/24/21 0000  vancomycin (VANCOCIN) IVPB 1000 mg/200 mL premix  Status:  Discontinued        1,000 mg 200 mL/hr over 60 Minutes Intravenous Every 24 hours 02/23/21 0022 02/24/21 0949   02/23/21 0115  piperacillin-tazobactam (ZOSYN) IVPB 3.375 g  Status:  Discontinued        3.375 g 12.5 mL/hr over 240 Minutes Intravenous Every 8 hours 02/23/21 0015 02/24/21 0949   02/23/21 0115  vancomycin (VANCOCIN) IVPB 1000 mg/200 mL premix        1,000 mg 200 mL/hr over 60 Minutes Intravenous  Once 02/23/21 0015 02/23/21 0151       Subjective: Reports feeling better today.  Eager to go home soon  Objective: Vitals:   02/27/21 2028 02/28/21 0403 02/28/21 0758 02/28/21 1348  BP: 112/67 95/62 (!) 158/90 102/65  Pulse: 67 78 93 88  Resp: 20 20  18   Temp: 97.6 F (36.4 C) 97.9 F (36.6 C)  98.3 F (36.8 C)  TempSrc: Oral Oral    SpO2: 98% 96% 98% 94%  Weight:      Height:        Intake/Output Summary (Last 24 hours) at 02/28/2021 1623 Last data  filed at 02/28/2021 1300 Gross per 24 hour  Intake 120 ml  Output --  Net 120 ml    Filed Weights   02/22/21 2308  Weight: 53 kg    Examination: General exam: Awake, laying in bed, in nad Respiratory system: Normal respiratory effort, no wheezing Cardiovascular system: regular rate, s1, s2 Gastrointestinal system: Soft, nondistended, positive BS Central nervous system: CN2-12 grossly intact, strength intact Extremities: Perfused, no clubbing Skin: Normal skin turgor, no notable skin lesions seen Psychiatry: Mood normal // no visual hallucinations   Data Reviewed: I have personally reviewed following labs and imaging studies  CBC: Recent Labs  Lab 02/22/21 1310 02/23/21 0937 02/24/21 0303 02/25/21 0357 02/26/21 0531 02/27/21 0836 02/28/21 0553  WBC 3.0*   < > 2.3* 4.7 6.3 5.1 5.0  NEUTROABS 1.3*  --   --   --   --   --   --   HGB 10.3*   < > 9.8* 9.3* 9.1* 8.2* 8.6*  HCT 31.9*   < > 29.8* 28.0* 28.0* 24.4* 25.3*  MCV 88.9   < > 86.1 87.5 88.3 87.5 84.9  PLT 92*   < >  97* 97* 82* 79* 91*   < > = values in this interval not displayed.    Basic Metabolic Panel: Recent Labs  Lab 02/23/21 0047 02/23/21 0937 02/24/21 0303 02/25/21 0357 02/25/21 1750 02/27/21 0836 02/28/21 0553  NA  --  133* 134* 135  --  131* 134*  K  --  3.2* 4.6 4.2  --  3.4* 3.8  CL  --  105 107 108  --  107 110  CO2  --  20* 20* 19*  --  22 18*  GLUCOSE  --  114* 315* 156*  --  95 94  BUN  --  <5* <5* 6*  --  9 11  CREATININE 0.71 0.69 0.86 0.92  --  0.86 0.63  CALCIUM  --  8.7* 8.6* 8.7*  --  7.9* 8.2*  MG 1.6*  --  2.0  --  1.7  --   --   PHOS 2.5  --   --   --   --   --   --     GFR: Estimated Creatinine Clearance: 67.2 mL/min (by C-G formula based on SCr of 0.63 mg/dL). Liver Function Tests: Recent Labs  Lab 02/22/21 1310 02/23/21 0937 02/24/21 0303 02/25/21 0357 02/27/21 0836 02/28/21 0553  AST 31  --  22 47* 45* 44*  ALT 16  --  ALKPHOS 75  --  78 67 62 64   BILITOT 3.5* 3.1* 2.0* 1.5* 1.7* 1.4*  PROT 6.5  --  6.5 6.2* 5.4* 6.2*  ALBUMIN 3.0*  --  2.9* 2.9* 2.5* 2.8*    No results for input(s): LIPASE, AMYLASE in the last 168 hours. Recent Labs  Lab 02/22/21 1310  AMMONIA 22    Coagulation Profile: No results for input(s): INR, PROTIME in the last 168 hours. Cardiac Enzymes: No results for input(s): CKTOTAL, CKMB, CKMBINDEX, TROPONINI in the last 168 hours. BNP (last 3 results) No results for input(s): PROBNP in the last 8760 hours. HbA1C: No results for input(s): HGBA1C in the last 72 hours. CBG: Recent Labs  Lab 02/27/21 2030 02/28/21 0125 02/28/21 0405 02/28/21 0757 02/28/21 1206  GLUCAP 103* 99 117* 106* 118*    Lipid Profile: No results for input(s): CHOL, HDL, LDLCALC, TRIG, CHOLHDL, LDLDIRECT in the last 72 hours. Thyroid Function Tests: No results for input(s): TSH, T4TOTAL, FREET4, T3FREE, THYROIDAB in the last 72 hours.  Anemia Panel: No results for input(s): VITAMINB12, FOLATE, FERRITIN, TIBC, IRON, RETICCTPCT in the last 72 hours.  Sepsis Labs: Recent Labs  Lab 02/22/21 1605 02/23/21 0047  PROCALCITON  --  <0.10  LATICACIDVEN 1.2 1.5     Recent Results (from the past 240 hour(s))  Resp Panel by RT-PCR (Flu A&B, Covid) Nasopharyngeal Swab     Status: None   Collection Time: 02/22/21  5:04 PM   Specimen: Nasopharyngeal Swab; Nasopharyngeal(NP) swabs in vial transport medium  Result Value Ref Range Status   SARS Coronavirus 2 by RT PCR NEGATIVE NEGATIVE Final    Comment: (NOTE) SARS-CoV-2 target nucleic acids are NOT DETECTED.  The SARS-CoV-2 RNA is generally detectable in upper respiratory specimens during the acute phase of infection. The lowest concentration of SARS-CoV-2 viral copies this assay can detect is 138 copies/mL. A negative result does not preclude SARS-Cov-2 infection and should not be used as the sole basis for treatment or other patient management decisions. A negative result may  occur with  improper specimen collection/handling, submission of specimen other than nasopharyngeal  swab, presence of viral mutation(s) within the areas targeted by this assay, and inadequate number of viral copies(<138 copies/mL). A negative result must be combined with clinical observations, patient history, and epidemiological information. The expected result is Negative.  Fact Sheet for Patients:  BloggerCourse.com  Fact Sheet for Healthcare Providers:  SeriousBroker.it  This test is no t yet approved or cleared by the [place] FDA and  has been authorized for detection and/or diagnosis of SARS-CoV-2 by FDA under an Emergency Use Authorization (EUA). This EUA will remain  in effect (meaning this test can be used) for the duration of the COVID-19 declaration under Section 564(b)(1) of the Act, 21 U.S.C.section 360bbb-3(b)(1), unless the authorization is terminated  or revoked sooner.       Influenza A by PCR NEGATIVE NEGATIVE Final   Influenza B by PCR NEGATIVE NEGATIVE Final    Comment: (NOTE) The Xpert Xpress SARS-CoV-2/FLU/RSV plus assay is intended as an aid in the diagnosis of influenza from Nasopharyngeal swab specimens and should not be used as a sole basis for treatment. Nasal washings and aspirates are unacceptable for Xpert Xpress SARS-CoV-2/FLU/RSV testing.  Fact Sheet for Patients: BloggerCourse.com  Fact Sheet for Healthcare Providers: SeriousBroker.it  This test is not yet approved or cleared by the [place] FDA and has been authorized for detection and/or diagnosis of SARS-CoV-2 by FDA under an Emergency Use Authorization (EUA). This EUA will remain in effect (meaning this test can be used) for the duration of the COVID-19 declaration under Section 564(b)(1) of the Act, 21 U.S.C. section 360bbb-3(b)(1), unless the authorization is terminated  or revoked.  Performed at [facility] [address] [place], [place] [address]   MRSA Next Gen by PCR, Nasal     Status: None   Collection Time: 02/22/21 11:27 PM   Specimen: Nasal Mucosa; Nasal Swab  Result Value Ref Range Status   MRSA by PCR Next Gen NOT DETECTED NOT DETECTED Final    Comment: (NOTE) The GeneXpert MRSA Assay (FDA approved for NASAL specimens only), is one component of a comprehensive MRSA colonization surveillance program. It is not intended to diagnose MRSA infection nor to guide or monitor treatment for MRSA infections. Test performance is not FDA approved in patients less than 44 years old. Performed at [facility], [address], [place], [place] [address]   Culture, blood (routine x 2)     Status: None   Collection Time: 02/23/21 12:47 AM   Specimen: BLOOD  Result Value Ref Range Status   Specimen Description   Final    BLOOD BLOOD RIGHT HAND Performed at [facility], [address] [place], [place] [address]    Special Requests   Final    BOTTLES DRAWN AEROBIC ONLY Blood Culture adequate volume Performed at [facility], [address], [place], [place] [address]    Culture   Final    NO GROWTH 5 DAYS Performed at [facility], [address], [place], [place] [address]    Report Status 02/28/2021 FINAL  Final  Culture, blood (routine x 2)     Status: None   Collection Time: 02/23/21 12:47 AM   Specimen: BLOOD  Result Value Ref Range Status   Specimen Description   Final    BLOOD BLOOD LEFT HAND Performed at [facility], [address] [place], [place] [address]    Special Requests   Final    BOTTLES DRAWN AEROBIC ONLY  Blood Culture adequate volume Performed at [facility], [address] [place], [place] [address]    Culture   Final    NO GROWTH 5 DAYS Performed at [facility], [address]  [place], [place] [address]    Report Status 02/28/2021 FINAL  Final      Radiology Studies: No results found.  Scheduled Meds:  (feeding supplement) PROSource Plus  30 mL Oral BID BM   amiodarone  200 mg Oral BID   amoxicillin-clavulanate  1 tablet Oral Q12H   feeding supplement  1 Container Oral BID BM   feeding supplement  237 mL Oral Q24H   folic acid  1 mg Oral Daily   heparin injection (subcutaneous)  5,000 Units Subcutaneous Q8H   hydrocortisone  5 mg Oral Daily   multivitamin with minerals  1 tablet Oral Daily   pantoprazole  40 mg Oral Daily   pneumococcal 23 valent vaccine  0.5 mL Intramuscular Tomorrow-1000   thiamine  100 mg Oral Daily   Or   thiamine  100 mg Intravenous Daily   Continuous Infusions:     LOS: 6 days   Rickey BarbaraStephen Savreen Gebhardt, MD [facility] Pager On Amion  If 7PM-7AM, please contact night-coverage 02/28/2021, 4:23 PM

## 2021-02-28 NOTE — Consult Note (Signed)
Consultation Note Date: 02/28/2021   Patient Name: Roy Ryan  DOB: 11/21/1950  MRN: [id number]  Age / Sex: 70 y.o., male  PCP: Patient, No Pcp Per (Inactive) Referring Physician: Donne Hazel, MD  Reason for Consultation: Establishing goals of care  HPI/Patient Profile: 70 y.o. male  with past medical history of alcohol abuse, alcohol withdrawal, atrial flutter, pulmonary hypertension admitted on 02/22/2021 after being found down hotel with hypoglycemia and living in poor conditions.  He was hypothermic and placed on antibiotics for sepsis no source of infection was identified.  He was found to have adrenal insufficiency and started on treatment.  He also had A. fib with RVR and was evaluated by cardiology.  Subsequent work-up revealed severe mitral regurg with prolapse.  Determined to be a poor candidate for surgical intervention and recommendation was for palliative care evaluation.  Clinical Assessment and Goals of Care: Palliative care consult received.  Chart reviewed including personal review of pertinent labs and imaging.  Discussed case with bedside care team.  His RN reports that Roy Ryan is irritable this afternoon and not wanting to participate in care.  States that he was in a much better mood yesterday.  I met today with Roy Ryan.  He was lying in bed and sleeping upon my arrival.  He aroused but was not engaged in conversation.  He states he does not feel like talking and wants to be left alone.  I attempted to discuss his understanding of his situation but he would not engage in conversation.  Attempted to discern who would be his surrogate decision maker as we have no other contacts on file.  He waved me off and closed his eyes to go back to sleep.  Questions and concerns addressed.   PMT will continue to support holistically.  SUMMARY OF RECOMMENDATIONS   -Full code/full scope -I  attempted to engage Roy Ryan in conversation regarding advance care planning and goals of care.  He was not open to conversation today.  His nurse reports that he has been agitated wanting to participate in care this afternoon. -He declined to discuss who would be his surrogate decision maker in the event he cannot make his own decisions. -Will attempt follow-up again sometime this weekend or early next week. -Would recommend outpatient palliative care to follow when he discharges.  Code Status/Advance Care Planning: Full code  Palliative Prophylaxis:  Bowel Regimen, Delirium Protocol, and Frequent Pain Assessment  Additional Recommendations (Limitations, Scope, Preferences): Full Scope Treatment  Psycho-social/Spiritual:  Desire for further Chaplaincy support:no Additional Recommendations: Caregiving  Support/Resources  Prognosis:  Guarded  Discharge Planning: To Be Determined      Primary Diagnoses: Present on Admission:  (Resolved) Severe sepsis ([place])  Hypotension  Atrial fibrillation with rapid ventricular response (HCC)  Hypomagnesemia  Hypoglycemia   I have reviewed the medical record, interviewed the patient and family, and examined the patient. The following aspects are pertinent.  Past Medical History:  Diagnosis Date   Alcohol abuse    Atrial flutter ([place])  Mitral regurgitation    Seizures (HCC)    Social History   Socioeconomic History   Marital status: Single    Spouse name: Not on file   Number of children: Not on file   Years of education: Not on file   Highest education level: Not on file  Occupational History   Not on file  Tobacco Use   Smoking status: Never   Smokeless tobacco: Never  Substance and Sexual Activity   Alcohol use: Never   Drug use: Never   Sexual activity: Not on file  Other Topics Concern   Not on file  Social History Narrative   Not on file   Social Determinants of Health   Financial Resource Strain: Not on file   Food Insecurity: Not on file  Transportation Needs: Not on file  Physical Activity: Not on file  Stress: Not on file  Social Connections: Not on file   Family History  Problem Relation Age of Onset   CAD Neg Hx    Heart failure Neg Hx    Stroke Neg Hx    Scheduled Meds:  (feeding supplement) PROSource Plus  30 mL Oral BID BM   amiodarone  200 mg Oral BID   amoxicillin-clavulanate  1 tablet Oral Q12H   feeding supplement  1 Container Oral BID BM   feeding supplement  237 mL Oral O27X   folic acid  1 mg Oral Daily   heparin injection (subcutaneous)  5,000 Units Subcutaneous Q8H   hydrocortisone  5 mg Oral Daily   multivitamin with minerals  1 tablet Oral Daily   pantoprazole  40 mg Oral Daily   pneumococcal 23 valent vaccine  0.5 mL Intramuscular Tomorrow-1000   thiamine  100 mg Oral Daily   Or   thiamine  100 mg Intravenous Daily   Continuous Infusions:  sodium chloride 100 mL/hr at 02/28/21 0500   sodium chloride     PRN Meds:.acetaminophen **OR** acetaminophen, sodium chloride Medications Prior to Admission:  Prior to Admission medications   Not on File   No Known Allergies Review of Systems Declines to answer other than reporting he is tired and wants to go to sleep.  Physical Exam General: Sleeping but arouses.  Irritable.   HEENT: No bruits, no goiter, no JVD Heart: Regular rate and rhythm. No murmur appreciated. Lungs: Good air movement, clear Abdomen: Soft, nontender, nondistended, positive bowel sounds.   Ext: No significant edema Skin: Warm and dry Neuro: Grossly intact, nonfocal.   Vital Signs: BP (!) 158/90 (BP Location: Left Arm)   Pulse 93   Temp 97.9 F (36.6 C) (Oral)   Resp 20   Ht '6\' 2"'  (1.88 m)   Wt 53 kg   SpO2 98%   BMI 15.00 kg/m  Pain Scale: 0-10   Pain Score: 0-No pain   SpO2: SpO2: 98 % O2 Device:SpO2: 98 % O2 Flow Rate: .O2 Flow Rate (L/min): 2 L/min  IO: Intake/output summary:  Intake/Output Summary (Last 24 hours) at  02/28/2021 1038 Last data filed at 02/28/2021 0900 Gross per 24 hour  Intake 238 ml  Output --  Net 238 ml    LBM:   Baseline Weight: Weight: 53 kg Most recent weight: Weight: 53 kg     Palliative Assessment/Data:   Flowsheet Rows    Flowsheet Row Most Recent Value  Intake Tab   Referral Department Hospitalist  Unit at Time of Referral Med/Surg Unit  Palliative Care Primary Diagnosis Cardiac  Date Notified 02/25/21  Palliative Care Type New Palliative care  Reason for referral Clarify Goals of Care  Date of Admission 02/22/21  Date first seen by Palliative Care 02/27/21  # of days Palliative referral response time 2 Day(s)  # of days IP prior to Palliative referral 3  Clinical Assessment   Palliative Performance Scale Score 40%  Psychosocial & Spiritual Assessment   Palliative Care Outcomes   Patient/Family meeting held? Yes  Who was at the meeting? Patient      Time Total: 55 minutes Greater than 50%  of this time was spent counseling and coordinating care related to the above assessment and plan.  Signed by: Micheline Rough, MD   Please contact Palliative Medicine Team phone at [phone] for questions and concerns.  For individual provider: See Shea Evans

## 2021-03-01 ENCOUNTER — Inpatient Hospital Stay (HOSPITAL_COMMUNITY): Payer: Medicare Other

## 2021-03-01 DIAGNOSIS — R0602 Shortness of breath: Secondary | ICD-10-CM

## 2021-03-01 LAB — GLUCOSE, CAPILLARY
Glucose-Capillary: 120 mg/dL — ABNORMAL HIGH (ref 70–99)
Glucose-Capillary: 71 mg/dL (ref 70–99)
Glucose-Capillary: 80 mg/dL (ref 70–99)
Glucose-Capillary: 45 mg/dL — ABNORMAL LOW (ref 70–99)
Glucose-Capillary: 37 mg/dL — CL (ref 70–99)
Glucose-Capillary: 48 mg/dL — ABNORMAL LOW (ref 70–99)
Glucose-Capillary: 227 mg/dL — ABNORMAL HIGH (ref 70–99)
Glucose-Capillary: 192 mg/dL — ABNORMAL HIGH (ref 70–99)

## 2021-03-01 LAB — COMPREHENSIVE METABOLIC PANEL
ALT: 16 U/L (ref 0–44)
AST: 43 U/L — ABNORMAL HIGH (ref 15–41)
Albumin: 2.8 g/dL — ABNORMAL LOW (ref 3.5–5.0)
Alkaline Phosphatase: 65 U/L (ref 38–126)
Anion gap: 7 (ref 5–15)
BUN: 13 mg/dL (ref 8–23)
CO2: 18 mmol/L — ABNORMAL LOW (ref 22–32)
Calcium: 8.3 mg/dL — ABNORMAL LOW (ref 8.9–10.3)
Chloride: 110 mmol/L (ref 98–111)
Creatinine, Ser: 0.86 mg/dL (ref 0.61–1.24)
GFR, Estimated: >60 mL/min (ref >60)
Glucose, Bld: 107 mg/dL — ABNORMAL HIGH (ref 70–99)
Potassium: 3.7 mmol/L (ref 3.5–5.1)
Sodium: 135 mmol/L (ref 135–145)
Total Bilirubin: 1.8 mg/dL — ABNORMAL HIGH (ref 0.3–1.2)
Total Protein: 6.2 g/dL — ABNORMAL LOW (ref 6.5–8.1)

## 2021-03-01 LAB — CBC
HCT: 25.6 % — ABNORMAL LOW (ref 39.0–52.0)
Hemoglobin: 8.7 g/dL — ABNORMAL LOW (ref 13.0–17.0)
MCH: 29.1 pg (ref 26.0–34.0)
MCHC: 34 g/dL (ref 30.0–36.0)
MCV: 85.6 fL (ref 80.0–100.0)
Platelets: 122 10*3/uL — ABNORMAL LOW (ref 150–400)
RBC: 2.99 MIL/uL — ABNORMAL LOW (ref 4.22–5.81)
RDW: 17.5 % — ABNORMAL HIGH (ref 11.5–15.5)
WBC: 6.7 10*3/uL (ref 4.0–10.5)
nRBC: 0 % (ref 0.0–0.2)

## 2021-03-01 LAB — MRSA NEXT GEN BY PCR, NASAL: MRSA by PCR Next Gen: NOT DETECTED

## 2021-03-01 MED ORDER — DEXTROSE-NACL 5-0.9 % IV SOLN: INTRAVENOUS | Status: DC

## 2021-03-01 MED ORDER — DEXTROSE 50 % IV SOLN
INTRAVENOUS | Status: AC
  Filled 2021-03-01: qty 50

## 2021-03-01 MED ORDER — GLUCAGON HCL RDNA (DIAGNOSTIC) 1 MG IJ SOLR
1.0000 mg | Freq: Once | INTRAMUSCULAR | Status: AC | PRN
  Administered 2021-03-02: 1 mg via INTRAVENOUS
  Filled 2021-03-01: qty 1

## 2021-03-01 MED ORDER — DEXTROSE 10 % IV SOLN: INTRAVENOUS | Status: DC

## 2021-03-01 MED ORDER — CHLORHEXIDINE GLUCONATE 0.12 % MT SOLN
15.0000 mL | Freq: Two times a day (BID) | OROMUCOSAL | Status: DC
  Administered 2021-03-01 – 2021-03-14 (×26): 15 mL via OROMUCOSAL
  Filled 2021-03-01 (×24): qty 15

## 2021-03-01 MED ORDER — DEXTROSE 50 % IV SOLN
1.0000 | Freq: Once | INTRAVENOUS | Status: AC
  Administered 2021-03-01: 50 mL via INTRAVENOUS

## 2021-03-01 MED ORDER — ORAL CARE MOUTH RINSE
15.0000 mL | Freq: Two times a day (BID) | OROMUCOSAL | Status: DC
Start: 2021-03-01 — End: 2021-03-07
  Administered 2021-03-01 – 2021-03-06 (×9): 15 mL via OROMUCOSAL

## 2021-03-01 MED ORDER — FUROSEMIDE 10 MG/ML IJ SOLN
40.0000 mg | Freq: Once | INTRAMUSCULAR | Status: AC
  Administered 2021-03-01: 40 mg via INTRAVENOUS
  Filled 2021-03-01: qty 4

## 2021-03-01 MED ORDER — CHLORHEXIDINE GLUCONATE CLOTH 2 % EX PADS
6.0000 | MEDICATED_PAD | Freq: Every day | CUTANEOUS | Status: DC
  Administered 2021-03-01 – 2021-03-07 (×7): 6 via TOPICAL

## 2021-03-01 MED ORDER — DEXTROSE 50 % IV SOLN
25.0000 g | INTRAVENOUS | Status: AC
  Administered 2021-03-01: 25 g via INTRAVENOUS

## 2021-03-01 NOTE — Progress Notes (Signed)
PROGRESS NOTE    Haaris Metallo  ZWC:[id number] DOB: 07-25-1950 DOA: 02/22/2021 PCP: Patient, No Pcp Per (Inactive)    Brief Narrative:  70 year old male with medical history of alcohol abuse, alcohol withdrawal seizures, atypical flutter, pulmonary hypertension presented to ED via EMS for evaluation of altered mental status.  Patient currently residing at hotel and was found to be hypoglycemic with CBG 37.  EMS reported poor living conditions, feces and urine bed, rotting food, multiple alcohol bottles.  Blood pressure was soft was given IV fluid bolus.  In the ED he was hypothermic with rectal temperature 95.2.  He was placed on [name]  Initially placed on empiric antibiotics for severe sepsis however no clear source of infection identified.  Due to persistent hypotension PCCM was consulted, found to have very low cortisol 2.1, cosyntropin stim test ordered for adrenal insufficiency.  He received IV fluid boluses, noted on pressor support.  Patient also found to be in A. fib with RVR.  Cardiology consulted.  Chest x-ray also showed progression fractures at C5-6-7   Assessment & Plan:   Principal Problem:   Hypotension Active Problems:   Atrial fibrillation with rapid ventricular response (HCC)   Hypomagnesemia   Hypoglycemia   Hypothermia   Protein-calorie malnutrition, severe   Nonrheumatic mitral valve regurgitation  Toxic metabolic encephalopathy -Noted to be back to baseline, appropriately conversant -EEG showed moderate diffuse encephalopathy, no seizures or epileptiform discharges seen -CT head was unremarkable, showed chronic right maxillary sinus disease, air-fluid in the left frontal sinus may represent acute or chronic sinusitis -Recent B12 2040, ammonia 22 -UDS noted to be neg -Patient seems to be mentating well this morning   Hypotension/adrenal insufficiency -Likely due to adrenal insufficiency -Cortisone stimulation test was done, reviewed, confirms adrenal  insufficiency -Also found to be in A. fib with RVR -No infectious source identified; chest x-ray showed no acute cardiopulmonary disease, lactic acid 1.5, procalcitonin less than 0.10, WBC 4.3, UA was clear -Was started on empiric antibiotics, PCCM was also consulted -Did not require pressor support, bp currently stable -Patient is now on daily hydrocortisone with more stable blood pressures   Thoracic compression fracture  -Initial chest x-ray chest x-ray showed progression fractures at C5-6-7 -Noted to have compression fractures at T5-T6 and T7 in thoracic spine which are stable from CT scan from 01/01/2020.  Without evidence of progression   New onset atrial fibrillation with RVR -Started on apixaban 5 mg twice a day; CHA2DS2VASc score of 1 -Cardiology initially planned for TEE/DCCV, however given hx noncompliance and social issues, patient is not a surgical candidate per cardiology -See below, palliative care was consulted however patient declined to address his goals ofcare or CODE STATUS with   Alcohol abuse -Had been continued on CIWA protocol -Continue thiamine   Subclinical hypothyroidism -TSH 7.133, T4 1.21 -Recommend repeating TSH in 6 to 8 weeks   Hypomagnesemia -Cont to replace lytes as needed -cont to follow electrolytes and replace as needed   Hyperbilirubinemia -Total bilirubin 3.5, improved to 3.1 this morning -Intact bilirubin is elevated at 1.9, direct bilirubin 1.2 -Unclear etiology, likely shock liver from hypotension -Would continue to follow LFT's   Hypokalemia -Continue to replace lytes as needed   Sinusitis -Seen on CT head -Currently on Augmentin   Alcohol abuse -Without evidence of alcohol withdrawal -Patient had been continued with CIWA protocol   Homeless issues -TOC following   Pancytopenia -Mild; likely from chronic alcohol abuse  Mitral regurg with prolapse  -Pt is now  s/p TEE with evidence of severe MR with a component of acute MR  likely present. Noted to be a poor candidate for surgical repair -Cardiology has recommended f/u with Palliative Care to establish goals of care -Appreciate assistance by palliative care.  Unfortunately, at this time patient does not seem to want to engage in discussing his goals of care.  -Today, patient noted to have increased O2 requirements and respiratory effort. -MAP response was called and patient was brought to ICU.  Stat chest x-ray ordered and reviewed personally, findings notable for pulmonary edema -Patient is now on BiPAP support.  We will continue Lasix diuresis as tolerated  Hypoglycemia -Patient noted to have glucose in the 30-40 range that was resistant to D50 injections -Following multiple doses of D50 as well as D10 IV fluids, glucose trends have since improved -Continue dextrose containing fluids and wean as tolerated  DVT prophylaxis: Heparin subq Code Status: Full Family Communication: Pt in room, family not at bedside  Status is: Inpatient  Remains inpatient appropriate because:Inpatient level of care appropriate due to severity of illness  Dispo: The patient is from: Home              Anticipated d/c is to: Home              Patient currently is not medically stable to d/c.   Difficult to place patient No  Consultants:  PCCM Cardiology Palliative Care  Procedures:    Antimicrobials: Anti-infectives (From admission, onward)    Start     Dose/Rate Route Frequency Ordered Stop   02/24/21 1800  amoxicillin-clavulanate (AUGMENTIN) 875-125 MG per tablet 1 tablet        1 tablet Oral Every 12 hours 02/24/21 0949 03/01/21 2359   02/24/21 0000  vancomycin (VANCOCIN) IVPB 1000 mg/200 mL premix  Status:  Discontinued        1,000 mg 200 mL/hr over 60 Minutes Intravenous Every 24 hours 02/23/21 0022 02/24/21 0949   02/23/21 0115  piperacillin-tazobactam (ZOSYN) IVPB 3.375 g  Status:  Discontinued        3.375 g 12.5 mL/hr over 240 Minutes Intravenous Every 8  hours 02/23/21 0015 02/24/21 0949   02/23/21 0115  vancomycin (VANCOCIN) IVPB 1000 mg/200 mL premix        1,000 mg 200 mL/hr over 60 Minutes Intravenous  Once 02/23/21 0015 02/23/21 0151       Subjective: Noted to be more short of breath today  Objective: Vitals:   03/01/21 1536 03/01/21 1603 03/01/21 1636 03/01/21 1640  BP: 113/78 131/79    Pulse: (!) 105 (!) 104  (!) 101  Resp: (!) 26 (!) 26  (!) 25  Temp: 97.7 F (36.5 C)     TempSrc: Axillary     SpO2: 99% 92% 95%   Weight: 77.5 kg     Height: 6\' 2"  (1.88 m)       Intake/Output Summary (Last 24 hours) at 03/01/2021 1651 Last data filed at 03/01/2021 1644 Gross per 24 hour  Intake 373.91 ml  Output 1200 ml  Net -826.09 ml    Filed Weights   02/22/21 2308 03/01/21 1536  Weight: 53 kg 77.5 kg    Examination: General exam: Awake, laying in bed, in nad Respiratory system: Increased respiratory effort, coarse Cardiovascular system: regular rate, s1, s2 Gastrointestinal system: Soft, nondistended, positive BS Central nervous system: CN2-12 grossly intact, strength intact Extremities: Perfused, no clubbing Skin: Normal skin turgor, no notable skin lesions seen Psychiatry: Mood normal //  no visual hallucinations    Data Reviewed: I have personally reviewed following labs and imaging studies  CBC: Recent Labs  Lab 02/25/21 0357 02/26/21 0531 02/27/21 0836 02/28/21 0553 03/01/21 0405  WBC 4.7 6.3 5.1 5.0 6.7  HGB 9.3* 9.1* 8.2* 8.6* 8.7*  HCT 28.0* 28.0* 24.4* 25.3* 25.6*  MCV 87.5 88.3 87.5 84.9 85.6  PLT 97* 82* 79* 91* 122*    Basic Metabolic Panel: Recent Labs  Lab 02/23/21 0047 02/23/21 0937 02/24/21 0303 02/25/21 0357 02/25/21 1750 02/27/21 0836 02/28/21 0553 03/01/21 0405  NA  --    < > 134* 135  --  131* 134* 135  K  --    < > 4.6 4.2  --  3.4* 3.8 3.7  CL  --    < > 107 108  --  107 110 110  CO2  --    < > 20* 19*  --  22 18* 18*  GLUCOSE  --    < > 315* 156*  --  95 94 107*  BUN  --     < > <5* 6*  --  9 11 13   CREATININE 0.71   < > 0.86 0.92  --  0.86 0.63 0.86  CALCIUM  --    < > 8.6* 8.7*  --  7.9* 8.2* 8.3*  MG 1.6*  --  2.0  --  1.7  --   --   --   PHOS 2.5  --   --   --   --   --   --   --    < > = values in this interval not displayed.    GFR: Estimated Creatinine Clearance: 91.4 mL/min (by C-G formula based on SCr of 0.86 mg/dL). Liver Function Tests: Recent Labs  Lab 02/24/21 0303 02/25/21 0357 02/27/21 0836 02/28/21 0553 03/01/21 0405  AST 22 47* 45* 44* 43*  ALT 8 13 15 16 16   ALKPHOS 78 67 62 64 65  BILITOT 2.0* 1.5* 1.7* 1.4* 1.8*  PROT 6.5 6.2* 5.4* 6.2* 6.2*  ALBUMIN 2.9* 2.9* 2.5* 2.8* 2.8*    No results for input(s): LIPASE, AMYLASE in the last 168 hours. No results for input(s): AMMONIA in the last 168 hours.  Coagulation Profile: No results for input(s): INR, PROTIME in the last 168 hours. Cardiac Enzymes: No results for input(s): CKTOTAL, CKMB, CKMBINDEX, TROPONINI in the last 168 hours. BNP (last 3 results) No results for input(s): PROBNP in the last 8760 hours. HbA1C: No results for input(s): HGBA1C in the last 72 hours. CBG: Recent Labs  Lab 03/01/21 1238 03/01/21 1451 03/01/21 1535 03/01/21 1606 03/01/21 1630  GLUCAP 80 45* 48* 37* 227*    Lipid Profile: No results for input(s): CHOL, HDL, LDLCALC, TRIG, CHOLHDL, LDLDIRECT in the last 72 hours. Thyroid Function Tests: No results for input(s): TSH, T4TOTAL, FREET4, T3FREE, THYROIDAB in the last 72 hours.  Anemia Panel: No results for input(s): VITAMINB12, FOLATE, FERRITIN, TIBC, IRON, RETICCTPCT in the last 72 hours.  Sepsis Labs: Recent Labs  Lab 02/23/21 0047  PROCALCITON <0.10  LATICACIDVEN 1.5     Recent Results (from the past 240 hour(s))  Resp Panel by RT-PCR (Flu A&B, Covid) Nasopharyngeal Swab     Status: None   Collection Time: 02/22/21  5:04 PM   Specimen: Nasopharyngeal Swab; Nasopharyngeal(NP) swabs in vial transport medium  Result Value Ref  Range Status   SARS Coronavirus 2 by RT PCR NEGATIVE NEGATIVE Final    Comment: (NOTE)  SARS-CoV-2 target nucleic acids are NOT DETECTED.  The SARS-CoV-2 RNA is generally detectable in upper respiratory specimens during the acute phase of infection. The lowest concentration of SARS-CoV-2 viral copies this assay can detect is 138 copies/mL. A negative result does not preclude SARS-Cov-2 infection and should not be used as the sole basis for treatment or other patient management decisions. A negative result may occur with  improper specimen collection/handling, submission of specimen other than nasopharyngeal swab, presence of viral mutation(s) within the areas targeted by this assay, and inadequate number of viral copies(<138 copies/mL). A negative result must be combined with clinical observations, patient history, and epidemiological information. The expected result is Negative.  Fact Sheet for Patients:  BloggerCourse.com  Fact Sheet for Healthcare Providers:  SeriousBroker.it  This test is no t yet approved or cleared by the [place] FDA and  has been authorized for detection and/or diagnosis of SARS-CoV-2 by FDA under an Emergency Use Authorization (EUA). This EUA will remain  in effect (meaning this test can be used) for the duration of the COVID-19 declaration under Section 564(b)(1) of the Act, 21 U.S.C.section 360bbb-3(b)(1), unless the authorization is terminated  or revoked sooner.       Influenza A by PCR NEGATIVE NEGATIVE Final   Influenza B by PCR NEGATIVE NEGATIVE Final    Comment: (NOTE) The Xpert Xpress SARS-CoV-2/FLU/RSV plus assay is intended as an aid in the diagnosis of influenza from Nasopharyngeal swab specimens and should not be used as a sole basis for treatment. Nasal washings and aspirates are unacceptable for Xpert Xpress SARS-CoV-2/FLU/RSV testing.  Fact Sheet for  Patients: BloggerCourse.com  Fact Sheet for Healthcare Providers: SeriousBroker.it  This test is not yet approved or cleared by the [place] FDA and has been authorized for detection and/or diagnosis of SARS-CoV-2 by FDA under an Emergency Use Authorization (EUA). This EUA will remain in effect (meaning this test can be used) for the duration of the COVID-19 declaration under Section 564(b)(1) of the Act, 21 U.S.C. section 360bbb-3(b)(1), unless the authorization is terminated or revoked.  Performed at [facility], [address], [place], [place] [address]   MRSA Next Gen by PCR, Nasal     Status: None   Collection Time: 02/22/21 11:27 PM   Specimen: Nasal Mucosa; Nasal Swab  Result Value Ref Range Status   MRSA by PCR Next Gen NOT DETECTED NOT DETECTED Final    Comment: (NOTE) The GeneXpert MRSA Assay (FDA approved for NASAL specimens only), is one component of a comprehensive MRSA colonization surveillance program. It is not intended to diagnose MRSA infection nor to guide or monitor treatment for MRSA infections. Test performance is not FDA approved in patients less than 55 years old. Performed at [facility], [address], [place], [place] [address]   Culture, blood (routine x 2)     Status: None   Collection Time: 02/23/21 12:47 AM   Specimen: BLOOD  Result Value Ref Range Status   Specimen Description   Final    BLOOD BLOOD RIGHT HAND Performed at [facility], [address] [place], [place] [address]    Special Requests   Final    BOTTLES DRAWN AEROBIC ONLY Blood Culture adequate volume Performed at [facility], [address], [place], [place] [address]    Culture   Final    NO GROWTH 5 DAYS Performed at [facility], [address], [place], [place] [address]  Report Status 02/28/2021 FINAL  Final  Culture, blood  (routine x 2)     Status: None   Collection Time: 02/23/21 12:47 AM   Specimen: BLOOD  Result Value Ref Range Status   Specimen Description   Final    BLOOD BLOOD LEFT HAND Performed at [facility], [address], [place], [place] [address]    Special Requests   Final    BOTTLES DRAWN AEROBIC ONLY Blood Culture adequate volume Performed at [facility], [address] [place], [place] [address]    Culture   Final    NO GROWTH 5 DAYS Performed at [facility], [address], [place], [place] [address]    Report Status 02/28/2021 FINAL  Final      Radiology Studies: DG Abd 1 View  Result Date: 03/01/2021 CLINICAL DATA:  Abdominal pain for 2 days EXAM: ABDOMEN - 1 VIEW COMPARISON:  Chest from 02/22/2021 FINDINGS: Scattered large and small bowel gas is noted. No free air is seen. Increased central vascular congestion is noted when compare with the recent exam. No bony abnormality is noted. Left hip replacement is noted. IMPRESSION: No obstructive changes are seen. Increased central vascular congestion. Electronically Signed   By: Alcide Clever M.D.   On: 03/01/2021 09:58   DG CHEST PORT 1 VIEW  Result Date: 03/01/2021 CLINICAL DATA:  Shortness of breath, altered mental status EXAM: PORTABLE CHEST 1 VIEW COMPARISON:  02/22/2021 FINDINGS: Cardiomegaly. There is new, diffuse bilateral heterogeneous and interstitial airspace opacity and small layering bilateral pleural effusions. IMPRESSION: Cardiomegaly with new, diffuse bilateral heterogeneous and interstitial airspace opacity and small layering bilateral pleural effusions, concerning for pulmonary edema. Electronically Signed   By: Lauralyn Primes M.D.   On: 03/01/2021 16:14    Scheduled Meds:  (feeding supplement) PROSource Plus  30 mL Oral BID BM   amiodarone  200 mg Oral BID   amoxicillin-clavulanate  1 tablet Oral Q12H   chlorhexidine  15 mL Mouth Rinse BID   Chlorhexidine Gluconate Cloth   6 each Topical Daily   feeding supplement  1 Container Oral BID BM   feeding supplement  237 mL Oral Q24H   folic acid  1 mg Oral Daily   heparin injection (subcutaneous)  5,000 Units Subcutaneous Q8H   hydrocortisone  5 mg Oral Daily   mouth rinse  15 mL Mouth Rinse q12n4p   multivitamin with minerals  1 tablet Oral Daily   pantoprazole  40 mg Oral Daily   pneumococcal 23 valent vaccine  0.5 mL Intramuscular Tomorrow-1000   sodium chloride flush  10-40 mL Intracatheter Q12H   thiamine  100 mg Oral Daily   Or   thiamine  100 mg Intravenous Daily   Continuous Infusions:  dextrose 75 mL/hr at 03/01/21 1645      LOS: 7 days   Rickey Barbara, MD [facility] Pager On Amion  If 7PM-7AM, please contact night-coverage 03/01/2021, 4:51 PM

## 2021-03-01 NOTE — Progress Notes (Addendum)
CBG 80 @ 1240. Pt sitting up eating meal at this time.

## 2021-03-01 NOTE — Progress Notes (Signed)
Daily Progress Note   Patient Name: Roy Ryan       Date: 03/01/2021 DOB: 24-Feb-1951  Age: 70 y.o. MRN#: [id number] Attending Physician: Jerald Kief, MD Primary Care Physician: Patient, No Pcp Per (Inactive) Admit Date: 02/22/2021  Reason for Consultation/Follow-up: Establishing goals of care  Subjective: Transferred to stepdown and currently on Bipap.  I saw and examined Roy Ryan.  He was lying in bed with Bipap in place.  He was alert and oriented.  I shared that I am concerned with the changes in his breathing and he nodded agreement.  He nodded yes when reviewing his clinical course andunderstanding of the situation.    I shared with him that if his breathing deteriorates further, the next step would be mechanical ventilation/intubation.  I asked his thoughts on life support and he stated "no."  I reviewed again ensure that he understood and he again indicated he would not want to be intubated.  I told him that we would continue with current interventions and support but not escalate care to intubation if that is his desire.  I also advised him that I do not think CPR would result in him ever being well enough to leave the hospital.  When I asked if he would want CPR, he also stated "no."  I attempted to clarify who would be his surrogate decision maker in the event he cannot make his own decisions.  He shook his head no and stated, "nobody."  Length of Stay: 7  Current Medications: Scheduled Meds:   (feeding supplement) PROSource Plus  30 mL Oral BID BM   amiodarone  200 mg Oral BID   amoxicillin-clavulanate  1 tablet Oral Q12H   chlorhexidine  15 mL Mouth Rinse BID   Chlorhexidine Gluconate Cloth  6 each Topical Daily   feeding supplement  1 Container Oral BID BM    feeding supplement  237 mL Oral Q24H   folic acid  1 mg Oral Daily   heparin injection (subcutaneous)  5,000 Units Subcutaneous Q8H   hydrocortisone  5 mg Oral Daily   mouth rinse  15 mL Mouth Rinse q12n4p   multivitamin with minerals  1 tablet Oral Daily   pantoprazole  40 mg Oral Daily   pneumococcal 23 valent vaccine  0.5 mL Intramuscular Tomorrow-1000   sodium  chloride flush  10-40 mL Intracatheter Q12H   thiamine  100 mg Oral Daily   Or   thiamine  100 mg Intravenous Daily    Continuous Infusions:  dextrose 50 mL/hr at 03/01/21 1930    PRN Meds: acetaminophen **OR** acetaminophen, glucagon (human recombinant), sodium chloride flush  Physical Exam     General: Alert, awake, currently on BiPAP  HEENT: No bruits, no goiter, no JVD Heart: Tachycardic. No murmur appreciated. Lungs: Decreased air movement, coarse throughout Abdomen: Soft, nontender, nondistended, positive bowel sounds.   Ext: No significant edema Skin: Warm and dry    Vital Signs: BP 122/78   Pulse (!) 109   Temp 97.7 F (36.5 C) (Oral)   Resp (!) 25   Ht 6\' 2"  (1.88 m)   Wt 77.5 kg   SpO2 92%   BMI 21.94 kg/m  SpO2: SpO2: 92 % O2 Device: O2 Device: Bi-PAP O2 Flow Rate: O2 Flow Rate (L/min): 15 L/min  Intake/output summary:  Intake/Output Summary (Last 24 hours) at 03/01/2021 2131 Last data filed at 03/01/2021 1842 Gross per 24 hour  Intake 273.91 ml  Output 2450 ml  Net -2176.09 ml   LBM: Last BM Date: 03/01/21 Baseline Weight: Weight: 53 kg Most recent weight: Weight: 77.5 kg       Palliative Assessment/Data:    Flowsheet Rows    Flowsheet Row Most Recent Value  Intake Tab   Referral Department Hospitalist  Unit at Time of Referral Med/Surg Unit  Palliative Care Primary Diagnosis Cardiac  Date Notified 02/25/21  Palliative Care Type New Palliative care  Reason for referral Clarify Goals of Care  Date of Admission 02/22/21  Date first seen by Palliative Care 02/27/21  # of days  Palliative referral response time 2 Day(s)  # of days IP prior to Palliative referral 3  Clinical Assessment   Palliative Performance Scale Score 40%  Psychosocial & Spiritual Assessment   Palliative Care Outcomes   Patient/Family meeting held? Yes  Who was at the meeting? Patient       Patient Active Problem List   Diagnosis Date Noted   Nonrheumatic mitral valve regurgitation    Hypotension 02/23/2021   HFrEF (heart failure with reduced ejection fraction) (HCC) 02/23/2021   Atrial fibrillation with rapid ventricular response (HCC) 02/23/2021   Hypomagnesemia 02/23/2021   Chronic alcoholic liver disease (HCC) 02/23/2021   Hypoalbuminemia 02/23/2021   Failure to thrive in adult 02/23/2021   Mild protein-calorie malnutrition (HCC) 02/23/2021   Thrombocytopenia (HCC) 02/23/2021   Hypoglycemia 02/23/2021   Hypothermia 02/23/2021   Protein-calorie malnutrition, severe 02/23/2021   Chest pain 01/31/2020    Palliative Care Assessment & Plan   Patient Profile: 70 y.o. male  with past medical history of alcohol abuse, alcohol withdrawal, atrial flutter, pulmonary hypertension admitted on 02/22/2021 after being found down hotel with hypoglycemia and living in poor conditions.  He was hypothermic and placed on antibiotics for sepsis no source of infection was identified.  He was found to have adrenal insufficiency and started on treatment.  He also had A. fib with RVR and was evaluated by cardiology.  Subsequent work-up revealed severe mitral regurg with prolapse.  Determined to be a poor candidate for surgical intervention and recommendation was for palliative care evaluation.  Recommendations/Plan: DNR/DNI Continue current care. He declined to name a surrogate 02/24/2021. Guarded prognosis  Code Status:    Code Status Orders  (From admission, onward)           Start  Ordered   03/01/21 1836  Do not attempt resuscitation (DNR)  Continuous       Question Answer  Comment  In the event of cardiac or respiratory ARREST Do not call a "code blue"   In the event of cardiac or respiratory ARREST Do not perform Intubation, CPR, defibrillation or ACLS   In the event of cardiac or respiratory ARREST Use medication by any route, position, wound care, and other measures to relive pain and suffering. May use oxygen, suction and manual treatment of airway obstruction as needed for comfort.      03/01/21 1835           Code Status History     Date Active Date Inactive Code Status Order ID Comments User Context   02/22/2021 2352 03/01/2021 1835 Full Code [id number]  John Giovanni, MD Inpatient   01/31/2020 1522 02/01/2020 1929 Full Code [id number]  Jae Dire, MD Inpatient       Discharge Planning: To Be Determined  Care plan was discussed with Patient, RN, Dr. Rhona Leavens  Thank you for allowing the Palliative Medicine Team to assist in the care of this patient.       Total Time 40 Prolonged Time Billed  no       Greater than 50%  of this time was spent counseling and coordinating care related to the above assessment and plan.  Romie Minus, MD  Please contact Palliative Medicine Team phone at [phone] for questions and concerns.

## 2021-03-01 NOTE — Progress Notes (Signed)
Upon arrival to ICU/SD, patient's CBG was 48. 1 amp D50 given per Dr. Johnna Acosta orders. Repeat CBG was 37. Another amp D50 given per protocol. Pt also has D10 infusing @ 75 mL/hr per orders. Dr Rhona Leavens notified by this RN; awaiting further orders at this time. RN will continue to carefully monitor pt.

## 2021-03-01 NOTE — Progress Notes (Signed)
Pt CBG 45 @ 1451; RR 22;O2 Sat 88% on 4L. Pt symptomatic. Hypoglycemic protocol followed. MD Rhona Leavens notified by charge RN. D50 given @ 1458 (See MAR). Pt placed on non-rebreather; O2 sat sustained at 88%. BP 118/91; HR 118. Rapid response called by charge RN.

## 2021-03-02 DIAGNOSIS — E43 Unspecified severe protein-calorie malnutrition: Secondary | ICD-10-CM

## 2021-03-02 LAB — GLUCOSE, CAPILLARY
Glucose-Capillary: 105 mg/dL — ABNORMAL HIGH (ref 70–99)
Glucose-Capillary: 63 mg/dL — ABNORMAL LOW (ref 70–99)
Glucose-Capillary: 59 mg/dL — ABNORMAL LOW (ref 70–99)
Glucose-Capillary: 71 mg/dL (ref 70–99)
Glucose-Capillary: 61 mg/dL — ABNORMAL LOW (ref 70–99)
Glucose-Capillary: 102 mg/dL — ABNORMAL HIGH (ref 70–99)
Glucose-Capillary: 72 mg/dL (ref 70–99)

## 2021-03-02 LAB — CBC
HCT: 29.6 % — ABNORMAL LOW (ref 39.0–52.0)
Hemoglobin: 9.5 g/dL — ABNORMAL LOW (ref 13.0–17.0)
MCH: 29.3 pg (ref 26.0–34.0)
MCHC: 32.1 g/dL (ref 30.0–36.0)
MCV: 91.4 fL (ref 80.0–100.0)
Platelets: 123 10*3/uL — ABNORMAL LOW (ref 150–400)
RBC: 3.24 MIL/uL — ABNORMAL LOW (ref 4.22–5.81)
RDW: 18.9 % — ABNORMAL HIGH (ref 11.5–15.5)
WBC: 8 10*3/uL (ref 4.0–10.5)
nRBC: 0 % (ref 0.0–0.2)

## 2021-03-02 LAB — HEMOGLOBIN A1C
Hgb A1c MFr Bld: 5.2 % (ref 4.8–5.6)
Mean Plasma Glucose: 102.54 mg/dL

## 2021-03-02 MED ORDER — FUROSEMIDE 10 MG/ML IJ SOLN
40.0000 mg | Freq: Once | INTRAMUSCULAR | Status: AC
  Administered 2021-03-02: 40 mg via INTRAVENOUS
  Filled 2021-03-02: qty 4

## 2021-03-02 MED ORDER — ENSURE ENLIVE PO LIQD
237.0000 mL | Freq: Two times a day (BID) | ORAL | Status: DC
  Administered 2021-03-03 – 2021-03-14 (×14): 237 mL via ORAL

## 2021-03-02 MED ORDER — FUROSEMIDE 10 MG/ML IJ SOLN
40.0000 mg | Freq: Every day | INTRAMUSCULAR | Status: DC
  Administered 2021-03-03 – 2021-03-04 (×2): 40 mg via INTRAVENOUS
  Filled 2021-03-02 (×3): qty 4

## 2021-03-02 MED ORDER — SODIUM CHLORIDE 0.9 % IV BOLUS
500.0000 mL | Freq: Once | INTRAVENOUS | Status: AC
  Administered 2021-03-02: 500 mL via INTRAVENOUS

## 2021-03-02 MED ORDER — HYDROCORTISONE 10 MG PO TABS
10.0000 mg | ORAL_TABLET | Freq: Two times a day (BID) | ORAL | Status: DC
  Filled 2021-03-02: qty 1

## 2021-03-02 MED ORDER — STERILE WATER FOR INJECTION IJ SOLN
INTRAMUSCULAR | Status: AC
  Filled 2021-03-02: qty 10

## 2021-03-02 MED ORDER — HYDROCORTISONE NA SUCCINATE PF 100 MG IJ SOLR
75.0000 mg | Freq: Two times a day (BID) | INTRAMUSCULAR | Status: DC
  Administered 2021-03-02 – 2021-03-05 (×6): 75 mg via INTRAVENOUS
  Filled 2021-03-02 (×6): qty 2

## 2021-03-02 NOTE — Progress Notes (Signed)
Nutrition Follow-up  DOCUMENTATION CODES:  Severe malnutrition in context of social or environmental circumstances, Underweight  INTERVENTION:  Continue Ensure Surgery daily.  Discontinue Boost Breeze BID.  Continue ProSource Plus BID.  Add Ensure Plus po BID, each supplement provides 350 kcal and 13 grams of protein.   Continue CIWA protocol.  NUTRITION DIAGNOSIS:  Severe Malnutrition related to social / environmental circumstances as evidenced by severe fat depletion, severe muscle depletion. - ongoing  GOAL:  Patient will meet greater than or equal to 90% of their needs - progressing  MONITOR:  PO intake, Supplement acceptance, Labs, Weight trends  REASON FOR ASSESSMENT:  Malnutrition Screening Tool    ASSESSMENT:  70 y.o. male with medical history of alcohol abuse, alcohol withdrawal seizures, atypical flutter, and pulmonary HTN. He presented to the ED d/t AMS. He is residing at a motel. EMS reported very poor living conditions to include urine and feces covering the bed, rotting food, and multiple alcohol bottles. He reported drinking 3-4 beers/day and that he had no food or water in his motel for an unknown amount of time.  Pt with continued varied intake - 0-90% over the past 8 meals.  Pt considering palliative care GOC per note yesterday and today regarding prognosis. Palliative thinks pt qualifies for hospice already, unfortunately.  Admit wt: 53 kg Current wt: 77.5 kg  Patient's weight increased from admission. This may not be true weight gain given the amount in the small period of time.  Supplements: ProSource Plus BID, Boost Breeze BID, Ensure Surgery daily  Medications: reviewed; folic acid, MVI with minerals, Protonix, thiamine, D10 @ 50 ml/hr, glucagon PRN (given once today)  Labs: reviewed; 59-105 (L-H) HbA1c: 5.2% (03/02/2021)  Diet Order:   Diet Order             Diet Heart Room service appropriate? Yes; Fluid consistency: Thin  Diet effective now                   EDUCATION NEEDS:  Not appropriate for education at this time  Skin:  Skin Assessment: Reviewed RN Assessment  Last BM:  03/01/21 - Type 5, smear  Height:  Ht Readings from Last 1 Encounters:  03/01/21 6\' 2"  (1.88 m)   Weight:  Wt Readings from Last 1 Encounters:  03/01/21 77.5 kg   Ideal Body Weight:  86.4 kg  BMI:  Body mass index is 21.94 kg/m.  Estimated Nutritional Needs:  Kcal:  2100-2300 kcal Protein:  105-120 grams Fluid:  >/= 2.5 L/day  03/03/21, RD, LDN (she/her/hers) Registered Dietitian I After-Hours/Weekend Pager # in [place]

## 2021-03-02 NOTE — Progress Notes (Signed)
NT checked blood glucose by fingerstick. NT reported to RN that CBG was 61. RN rechecked CBG through midline. This RN flushed midline with two 10 cc saline flushes and wasted 7cc of blood. This RN drew 1cc of blood and rechecked CBG. Current CBG reading is 102. Patient A/Ox4.Vitals stable.

## 2021-03-02 NOTE — Progress Notes (Signed)
Patient's BP 80/48(58). This RN notified Jeannett Senior Chiu,MD. This RN received an order to start a Normal Saline bolus. Patient is alert and asymptomatic at this time. RN at bedside. BP will be rechecked throughout treatment.

## 2021-03-02 NOTE — Progress Notes (Signed)
Patient currently on BIPAP with settings 12/6 and 60% FIO2. He appeared to have pursed lip breathing and would not keep the mask on his face. His oxygen saturations decreased as low as 82% ,Due to this problem , the BIPAP settings and FIO2 was changed. The following setting are the current settings: 12/6 and 60% with a resp rate 12. RT will continue to monitor

## 2021-03-02 NOTE — Progress Notes (Signed)
Daily Progress Note   Patient Name: Roy Ryan       Date: 03/02/2021 DOB: [birth year]  Age: 70 y.o. MRN#: [id number] Attending Physician: Jerald Kief, MD Primary Care Physician: Patient, No Pcp Per (Inactive) Admit Date: 02/22/2021  Reason for Consultation/Follow-up: Establishing goals of care  Subjective: Patient was seen and examined currently in stepdown unit at [facility] in [place], [place].  Patient appears to have pursed-lip breathing, appears to be using accessory muscles.  He is alert and oriented, denies shortness of breath.  States that his breakfast has gotten cold, complains of episodic abdominal discomfort.  Denies chest pain.  He is aware that he has a heart problem.  Length of Stay: 8  Current Medications: Scheduled Meds:   (feeding supplement) PROSource Plus  30 mL Oral BID BM   amiodarone  200 mg Oral BID   chlorhexidine  15 mL Mouth Rinse BID   Chlorhexidine Gluconate Cloth  6 each Topical Daily   feeding supplement  1 Container Oral BID BM   feeding supplement  237 mL Oral Q24H   folic acid  1 mg Oral Daily   heparin injection (subcutaneous)  5,000 Units Subcutaneous Q8H   hydrocortisone  5 mg Oral Daily   mouth rinse  15 mL Mouth Rinse q12n4p   multivitamin with minerals  1 tablet Oral Daily   pantoprazole  40 mg Oral Daily   pneumococcal 23 valent vaccine  0.5 mL Intramuscular Tomorrow-1000   sodium chloride flush  10-40 mL Intracatheter Q12H   thiamine  100 mg Oral Daily   Or   thiamine  100 mg Intravenous Daily    Continuous Infusions:  dextrose 50 mL/hr at 03/02/21 9628    PRN Meds: acetaminophen **OR** acetaminophen, glucagon (human recombinant), sodium chloride flush  Physical Exam     General: Alert, awake, currently on  oxygen via nasal cannula, appears to be tachypneic HEENT: No bruits, no goiter, no JVD Heart: Tachycardic.  Has systolic murmur Lungs: Decreased air movement, coarse throughout Abdomen: Soft, nontender, nondistended, positive bowel sounds.   Ext: No significant edema Skin: Warm and dry    Vital Signs: BP 101/76 (BP Location: Right Arm)   Pulse 74   Temp 98.3 F (36.8 C) (Axillary)   Resp (!) 29   Ht 6\' 2"  (1.88  m)   Wt 77.5 kg   SpO2 94%   BMI 21.94 kg/m  SpO2: SpO2: 94 % O2 Device: O2 Device: Bi-PAP O2 Flow Rate: O2 Flow Rate (L/min): 15 L/min  Intake/output summary:  Intake/Output Summary (Last 24 hours) at 03/02/2021 0912 Last data filed at 03/02/2021 0800 Gross per 24 hour  Intake 820.96 ml  Output 4586 ml  Net -3765.04 ml    LBM: Last BM Date: 03/01/21 Baseline Weight: Weight: 53 kg Most recent weight: Weight: 77.5 kg       Palliative Assessment/Data:    Flowsheet Rows    Flowsheet Row Most Recent Value  Intake Tab   Referral Department Hospitalist  Unit at Time of Referral Med/Surg Unit  Palliative Care Primary Diagnosis Cardiac  Date Notified 02/25/21  Palliative Care Type New Palliative care  Reason for referral Clarify Goals of Care  Date of Admission 02/22/21  Date first seen by Palliative Care 02/27/21  # of days Palliative referral response time 2 Day(s)  # of days IP prior to Palliative referral 3  Clinical Assessment   Palliative Performance Scale Score 40%  Psychosocial & Spiritual Assessment   Palliative Care Outcomes   Patient/Family meeting held? Yes  Who was at the meeting? Patient       Patient Active Problem List   Diagnosis Date Noted   Nonrheumatic mitral valve regurgitation    Hypotension 02/23/2021   HFrEF (heart failure with reduced ejection fraction) (HCC) 02/23/2021   Atrial fibrillation with rapid ventricular response (HCC) 02/23/2021   Hypomagnesemia 02/23/2021   Chronic alcoholic liver disease (HCC) 02/23/2021    Hypoalbuminemia 02/23/2021   Failure to thrive in adult 02/23/2021   Mild protein-calorie malnutrition (HCC) 02/23/2021   Thrombocytopenia (HCC) 02/23/2021   Hypoglycemia 02/23/2021   Hypothermia 02/23/2021   Protein-calorie malnutrition, severe 02/23/2021   Chest pain 01/31/2020    Palliative Care Assessment & Plan   Patient Profile: 70 y.o. male  with past medical history of alcohol abuse, alcohol withdrawal, atrial flutter, pulmonary hypertension admitted on 02/22/2021 after being found down hotel with hypoglycemia and living in poor conditions.  He was hypothermic and placed on antibiotics for sepsis no source of infection was identified.  He was found to have adrenal insufficiency and started on treatment.  He also had A. fib with RVR and was evaluated by cardiology.  Subsequent work-up revealed severe mitral regurg with prolapse.  Determined to be a poor candidate for surgical intervention and recommendation was for palliative care evaluation.  Recommendations/Plan: DNR/DNI Continue current care. He declined to name a surrogate decision maker: States he has a Museum/gallery conservator does not know her contact information.  States that he lives in a motel in [place].  Guarded prognosis: Patient noted to have severe mitral regurgitation, echocardiogram reviewed.  Also has severe physical deconditioning, poor oral intake.  Has low blood sugars for which he is on D10 drip.  At present, patient does not believe that he would want to go to a SNF rehab even if offered.  He would rather be home.  Appears to have a host of acute as well as underlying conditions that make prognosis guarded in my opinion.  Follow hospital course and overall disease trajectory of illness for now so as to be able to have further goals of care discussions with the patient and also help determine best possible disposition.  I worry that the patient is already hospice eligible based on the above.  Code Status:    Code Status  Orders  (From admission, onward)           Start     Ordered   03/01/21 1836  Do not attempt resuscitation (DNR)  Continuous       Question Answer Comment  In the event of cardiac or respiratory ARREST Do not call a "code blue"   In the event of cardiac or respiratory ARREST Do not perform Intubation, CPR, defibrillation or ACLS   In the event of cardiac or respiratory ARREST Use medication by any route, position, wound care, and other measures to relive pain and suffering. May use oxygen, suction and manual treatment of airway obstruction as needed for comfort.      03/01/21 1835           Code Status History     Date Active Date Inactive Code Status Order ID Comments User Context   02/22/2021 2352 03/01/2021 1835 Full Code [id number]  John Giovanni, MD Inpatient   01/31/2020 1522 02/01/2020 1929 Full Code [id number]  Jae Dire, MD Inpatient       Discharge Planning: To Be Determined  Care plan was discussed with Patient, RN,   Thank you for allowing the Palliative Medicine Team to assist in the care of this patient.       Total Time 35 Prolonged Time Billed  no       Greater than 50%  of this time was spent counseling and coordinating care related to the above assessment and plan.  Rosalin Hawking, MD  Please contact Palliative Medicine Team phone at [phone] for questions and concerns.

## 2021-03-02 NOTE — Progress Notes (Signed)
PROGRESS NOTE    Roy Ryan  WUJ:[id number] DOB: 12/08/1950 DOA: 02/22/2021 PCP: Patient, No Pcp Per (Inactive)    Brief Narrative:  70 year old male with medical history of alcohol abuse, alcohol withdrawal seizures, atypical flutter, pulmonary hypertension presented to ED via EMS for evaluation of altered mental status.  Patient currently residing at hotel and was found to be hypoglycemic with CBG 37.  EMS reported poor living conditions, feces and urine bed, rotting food, multiple alcohol bottles.  Blood pressure was soft was given IV fluid bolus.  In the ED he was hypothermic with rectal temperature 95.2.  He was placed on [name]  Initially placed on empiric antibiotics for severe sepsis however no clear source of infection identified.  Due to persistent hypotension PCCM was consulted, found to have very low cortisol 2.1, cosyntropin stim test ordered for adrenal insufficiency.  He received IV fluid boluses, noted on pressor support.  Patient also found to be in A. fib with RVR.  Cardiology consulted.  Chest x-ray also showed progression fractures at C5-6-7   Assessment & Plan:   Principal Problem:   Hypotension Active Problems:   Atrial fibrillation with rapid ventricular response (HCC)   Hypomagnesemia   Hypoglycemia   Hypothermia   Protein-calorie malnutrition, severe   Nonrheumatic mitral valve regurgitation  Toxic metabolic encephalopathy -Noted to be back to baseline, appropriately conversant -EEG showed moderate diffuse encephalopathy, no seizures or epileptiform discharges seen -CT head was unremarkable, showed chronic right maxillary sinus disease, air-fluid in the left frontal sinus may represent acute or chronic sinusitis -Recent B12 2040, ammonia 22 -Presenting UDS noted to be negative -More encephalopathic today   Hypotension/adrenal insufficiency -Likely due to adrenal insufficiency -Cortisone stimulation test was done, reviewed, confirms adrenal  insufficiency -No infectious source identified; chest x-ray showed no acute cardiopulmonary disease, lactic acid 1.5, procalcitonin less than 0.10, WBC 4.3, UA was clear -Was started on empiric antibiotics, PCCM was later consulted -Did not require pressor support, bp has since remained stable since starting steroids   Thoracic compression fracture  -Initial chest x-ray chest x-ray showed progression fractures at C5-6-7 -Noted to have compression fractures at T5-T6 and T7 in thoracic spine which are stable from CT scan from 01/01/2020.  Without evidence of progression   New onset atrial fibrillation with RVR -Started on apixaban 5 mg twice a day; CHA2DS2VASc score of 1 -Cardiology initially planned for TEE/DCCV, however given hx noncompliance and social issues, patient is not a surgical candidate per cardiology -See below, palliative care was consulted.  With recent decompensation, patient has decided on DNR and DNI status.  Given his incurable disease and poor overall prognosis, his wishes are very much appropriate   Alcohol abuse -Had been continued on CIWA protocol -Continue thiamine   Subclinical hypothyroidism -TSH 7.133, T4 1.21 -Recommend repeating TSH in 6 to 8 weeks   Hypomagnesemia -Cont to replace lytes as needed -cont to follow electrolytes and replace as needed   Hyperbilirubinemia -Unclear etiology, likely shock liver from hypotension -LFT trends have improved over time   Hypokalemia -Had been stable recently -Continue to replace lytes as needed   Sinusitis -Seen on CT head -Completed course of Augmentin   Alcohol abuse -Without evidence of alcohol withdrawal -Patient had been continued with CIWA protocol   Homeless issues -TOC following   Pancytopenia -Mild; likely from chronic alcohol abuse  Mitral regurg with prolapse  -Pt is now s/p TEE with evidence of severe MR with a component of acute MR likely present.  Noted to be a poor candidate for surgical  repair -Cardiology has recommended f/u with Palliative Care to establish goals of care -Appreciate assistance by palliative care.   -Recently noted to have increased O2 requirements and respiratory effort. -MAP response was called and patient was brought to ICU.  Stat chest x-ray ordered and reviewed personally, findings notable for pulmonary edema -Patient is now on BiPAP support.  We will continue Lasix diuresis as tolerated -See palliative care note from 03/01/2021.  Patient's wishes are now noted to be DNR/DNI  Hypoglycemia -Patient noted to have glucose in the 30-40 range that was resistant to D50 injections on 03/01/2021 -Following multiple doses of D50 as well as D10 IV fluids, glucose trends have since improved -Patient is continued on dextrose containing IV fluids  DVT prophylaxis: Heparin subq Code Status: Full Family Communication: Pt in room, family not at bedside  Status is: Inpatient  Remains inpatient appropriate because:Inpatient level of care appropriate due to severity of illness  Dispo: The patient is from: Home              Anticipated d/c is to: Home              Patient currently is not medically stable to d/c.   Difficult to place patient No  Consultants:  PCCM Cardiology Palliative Care  Procedures:    Antimicrobials: Anti-infectives (From admission, onward)    Start     Dose/Rate Route Frequency Ordered Stop   02/24/21 1800  amoxicillin-clavulanate (AUGMENTIN) 875-125 MG per tablet 1 tablet        1 tablet Oral Every 12 hours 02/24/21 0949 03/01/21 2149   02/24/21 0000  vancomycin (VANCOCIN) IVPB 1000 mg/200 mL premix  Status:  Discontinued        1,000 mg 200 mL/hr over 60 Minutes Intravenous Every 24 hours 02/23/21 0022 02/24/21 0949   02/23/21 0115  piperacillin-tazobactam (ZOSYN) IVPB 3.375 g  Status:  Discontinued        3.375 g 12.5 mL/hr over 240 Minutes Intravenous Every 8 hours 02/23/21 0015 02/24/21 0949   02/23/21 0115  vancomycin  (VANCOCIN) IVPB 1000 mg/200 mL premix        1,000 mg 200 mL/hr over 60 Minutes Intravenous  Once 02/23/21 0015 02/23/21 0151       Subjective: Denies shortness of breath this morning or chest pains claims to be feeling better while on BiPAP  Objective: Vitals:   03/02/21 1142 03/02/21 1200 03/02/21 1300 03/02/21 1454  BP: 113/81 106/69 111/81   Pulse: (!) 113 (!) 113 99 99  Resp: (!) 25 (!) 24 (!) 21 20  Temp:  98.8 F (37.1 C)    TempSrc:  Oral    SpO2: 97% (!) 88% 95% 95%  Weight:      Height:        Intake/Output Summary (Last 24 hours) at 03/02/2021 1513 Last data filed at 03/02/2021 1457 Gross per 24 hour  Intake 1240.61 ml  Output 6236 ml  Net -4995.39 ml    Filed Weights   02/22/21 2308 03/01/21 1536  Weight: 53 kg 77.5 kg    Examination: General exam: Conversant, in no acute distress Respiratory system: Increased respiratory effort, decreased breath sounds Cardiovascular system: regular rhythm, s1-s2 Gastrointestinal system: Nondistended, nontender, pos BS Central nervous system: No seizures, no tremors Extremities: No cyanosis, no joint deformities Skin: No rashes, no pallor Psychiatry: Affect normal // no auditory hallucinations    Data Reviewed: I have personally reviewed following labs  and imaging studies  CBC: Recent Labs  Lab 02/26/21 0531 02/27/21 0836 02/28/21 0553 03/01/21 0405 03/02/21 0243  WBC 6.3 5.1 5.0 6.7 8.0  HGB 9.1* 8.2* 8.6* 8.7* 9.5*  HCT 28.0* 24.4* 25.3* 25.6* 29.6*  MCV 88.3 87.5 84.9 85.6 91.4  PLT 82* 79* 91* 122* 123*    Basic Metabolic Panel: Recent Labs  Lab 02/24/21 0303 02/25/21 0357 02/25/21 1750 02/27/21 0836 02/28/21 0553 03/01/21 0405  NA 134* 135  --  131* 134* 135  K 4.6 4.2  --  3.4* 3.8 3.7  CL 107 108  --  107 110 110  CO2 20* 19*  --  22 18* 18*  GLUCOSE 315* 156*  --  95 94 107*  BUN <5* 6*  --  CREATININE 0.86 0.92  --  0.86 0.63 0.86  CALCIUM 8.6* 8.7*  --  7.9* 8.2* 8.3*  MG  2.0  --  1.7  --   --   --     GFR: Estimated Creatinine Clearance: 91.4 mL/min (by C-G formula based on SCr of 0.86 mg/dL). Liver Function Tests: Recent Labs  Lab 02/24/21 0303 02/25/21 0357 02/27/21 0836 02/28/21 0553 03/01/21 0405  AST 22 47* 45* 44* 43*  ALT ALKPHOS 78 67 62 64 65  BILITOT 2.0* 1.5* 1.7* 1.4* 1.8*  PROT 6.5 6.2* 5.4* 6.2* 6.2*  ALBUMIN 2.9* 2.9* 2.5* 2.8* 2.8*    No results for input(s): LIPASE, AMYLASE in the last 168 hours. No results for input(s): AMMONIA in the last 168 hours.  Coagulation Profile: No results for input(s): INR, PROTIME in the last 168 hours. Cardiac Enzymes: No results for input(s): CKTOTAL, CKMB, CKMBINDEX, TROPONINI in the last 168 hours. BNP (last 3 results) No results for input(s): PROBNP in the last 8760 hours. HbA1C: Recent Labs    03/02/21 0243  HGBA1C 5.2   CBG: Recent Labs  Lab 03/01/21 2058 03/02/21 0516 03/02/21 0803 03/02/21 1003 03/02/21 1115  GLUCAP 192* 105* 63* 59* 71    Lipid Profile: No results for input(s): CHOL, HDL, LDLCALC, TRIG, CHOLHDL, LDLDIRECT in the last 72 hours. Thyroid Function Tests: No results for input(s): TSH, T4TOTAL, FREET4, T3FREE, THYROIDAB in the last 72 hours.  Anemia Panel: No results for input(s): VITAMINB12, FOLATE, FERRITIN, TIBC, IRON, RETICCTPCT in the last 72 hours.  Sepsis Labs: No results for input(s): PROCALCITON, LATICACIDVEN in the last 168 hours.   Recent Results (from the past 240 hour(s))  Resp Panel by RT-PCR (Flu A&B, Covid) Nasopharyngeal Swab     Status: None   Collection Time: 02/22/21  5:04 PM   Specimen: Nasopharyngeal Swab; Nasopharyngeal(NP) swabs in vial transport medium  Result Value Ref Range Status   SARS Coronavirus 2 by RT PCR NEGATIVE NEGATIVE Final    Comment: (NOTE) SARS-CoV-2 target nucleic acids are NOT DETECTED.  The SARS-CoV-2 RNA is generally detectable in upper respiratory specimens during the acute phase of  infection. The lowest concentration of SARS-CoV-2 viral copies this assay can detect is 138 copies/mL. A negative result does not preclude SARS-Cov-2 infection and should not be used as the sole basis for treatment or other patient management decisions. A negative result may occur with  improper specimen collection/handling, submission of specimen other than nasopharyngeal swab, presence of viral mutation(s) within the areas targeted by this assay, and inadequate number of viral copies(<138 copies/mL). A negative result must be combined with clinical observations, patient history, and epidemiological information. The  expected result is Negative.  Fact Sheet for Patients:  BloggerCourse.com  Fact Sheet for Healthcare Providers:  SeriousBroker.it  This test is no t yet approved or cleared by the [place] FDA and  has been authorized for detection and/or diagnosis of SARS-CoV-2 by FDA under an Emergency Use Authorization (EUA). This EUA will remain  in effect (meaning this test can be used) for the duration of the COVID-19 declaration under Section 564(b)(1) of the Act, 21 U.S.C.section 360bbb-3(b)(1), unless the authorization is terminated  or revoked sooner.       Influenza A by PCR NEGATIVE NEGATIVE Final   Influenza B by PCR NEGATIVE NEGATIVE Final    Comment: (NOTE) The Xpert Xpress SARS-CoV-2/FLU/RSV plus assay is intended as an aid in the diagnosis of influenza from Nasopharyngeal swab specimens and should not be used as a sole basis for treatment. Nasal washings and aspirates are unacceptable for Xpert Xpress SARS-CoV-2/FLU/RSV testing.  Fact Sheet for Patients: BloggerCourse.com  Fact Sheet for Healthcare Providers: SeriousBroker.it  This test is not yet approved or cleared by the [place] FDA and has been authorized for detection and/or diagnosis of SARS-CoV-2  by FDA under an Emergency Use Authorization (EUA). This EUA will remain in effect (meaning this test can be used) for the duration of the COVID-19 declaration under Section 564(b)(1) of the Act, 21 U.S.C. section 360bbb-3(b)(1), unless the authorization is terminated or revoked.  Performed at [facility], [address], [place], [place] [address]   MRSA Next Gen by PCR, Nasal     Status: None   Collection Time: 02/22/21 11:27 PM   Specimen: Nasal Mucosa; Nasal Swab  Result Value Ref Range Status   MRSA by PCR Next Gen NOT DETECTED NOT DETECTED Final    Comment: (NOTE) The GeneXpert MRSA Assay (FDA approved for NASAL specimens only), is one component of a comprehensive MRSA colonization surveillance program. It is not intended to diagnose MRSA infection nor to guide or monitor treatment for MRSA infections. Test performance is not FDA approved in patients less than 68 years old. Performed at [facility], [address] [place], [place] [address]   Culture, blood (routine x 2)     Status: None   Collection Time: 02/23/21 12:47 AM   Specimen: BLOOD  Result Value Ref Range Status   Specimen Description   Final    BLOOD BLOOD RIGHT HAND Performed at [facility], [address], [place], [place] [address]    Special Requests   Final    BOTTLES DRAWN AEROBIC ONLY Blood Culture adequate volume Performed at [facility], [address], [place], [place] [address]    Culture   Final    NO GROWTH 5 DAYS Performed at [facility], [address] [place], [place] [address]    Report Status 02/28/2021 FINAL  Final  Culture, blood (routine x 2)     Status: None   Collection Time: 02/23/21 12:47 AM   Specimen: BLOOD  Result Value Ref Range Status   Specimen Description   Final    BLOOD BLOOD LEFT HAND Performed at [facility], [address] [place], [place] [address]     Special Requests   Final    BOTTLES DRAWN AEROBIC ONLY Blood Culture adequate volume Performed at [facility], [address], [place], [place] [address]    Culture   Final    NO GROWTH 5 DAYS Performed at  [facility], [address] [place], [place] [address]    Report Status 02/28/2021 FINAL  Final  MRSA Next Gen by PCR, Nasal     Status: None   Collection Time: 03/01/21  4:00 PM   Specimen: Nasal Mucosa; Nasal Swab  Result Value Ref Range Status   MRSA by PCR Next Gen NOT DETECTED NOT DETECTED Final    Comment: (NOTE) The GeneXpert MRSA Assay (FDA approved for NASAL specimens only), is one component of a comprehensive MRSA colonization surveillance program. It is not intended to diagnose MRSA infection nor to guide or monitor treatment for MRSA infections. Test performance is not FDA approved in patients less than 68 years old. Performed at [facility], [address] [place], [place] [address]       Radiology Studies: DG Abd 1 View  Result Date: 03/01/2021 CLINICAL DATA:  Abdominal pain for 2 days EXAM: ABDOMEN - 1 VIEW COMPARISON:  Chest from 02/22/2021 FINDINGS: Scattered large and small bowel gas is noted. No free air is seen. Increased central vascular congestion is noted when compare with the recent exam. No bony abnormality is noted. Left hip replacement is noted. IMPRESSION: No obstructive changes are seen. Increased central vascular congestion. Electronically Signed   By: Alcide CleverMark  Lukens M.D.   On: 03/01/2021 09:58   DG CHEST PORT 1 VIEW  Result Date: 03/01/2021 CLINICAL DATA:  Shortness of breath, altered mental status EXAM: PORTABLE CHEST 1 VIEW COMPARISON:  02/22/2021 FINDINGS: Cardiomegaly. There is new, diffuse bilateral heterogeneous and interstitial airspace opacity and small layering bilateral pleural effusions. IMPRESSION: Cardiomegaly with new, diffuse bilateral heterogeneous and interstitial airspace opacity and  small layering bilateral pleural effusions, concerning for pulmonary edema. Electronically Signed   By: Lauralyn PrimesAlex  Bibbey M.D.   On: 03/01/2021 16:14    Scheduled Meds:  (feeding supplement) PROSource Plus  30 mL Oral BID BM   amiodarone  200 mg Oral BID   chlorhexidine  15 mL Mouth Rinse BID   Chlorhexidine Gluconate Cloth  6 each Topical Daily   feeding supplement  237 mL Oral BID BM   feeding supplement  237 mL Oral Q24H   folic acid  1 mg Oral Daily   [START ON 03/03/2021] furosemide  40 mg Intravenous Daily   heparin injection (subcutaneous)  5,000 Units Subcutaneous Q8H   hydrocortisone  5 mg Oral Daily   mouth rinse  15 mL Mouth Rinse q12n4p   multivitamin with minerals  1 tablet Oral Daily   pantoprazole  40 mg Oral Daily   pneumococcal 23 valent vaccine  0.5 mL Intramuscular Tomorrow-1000   sodium chloride flush  10-40 mL Intracatheter Q12H   sterile water (preservative free)       thiamine  100 mg Oral Daily   Or   thiamine  100 mg Intravenous Daily   Continuous Infusions:  dextrose 50 mL/hr at 03/02/21 1226      LOS: 8 days   Rickey BarbaraStephen Tynia Wiers, MD [facility] Pager On Amion  If 7PM-7AM, please contact night-coverage 03/02/2021, 3:13 PM

## 2021-03-03 DIAGNOSIS — Z515 Encounter for palliative care: Secondary | ICD-10-CM

## 2021-03-03 DIAGNOSIS — Z7189 Other specified counseling: Secondary | ICD-10-CM

## 2021-03-03 DIAGNOSIS — R531 Weakness: Secondary | ICD-10-CM

## 2021-03-03 LAB — CBC
HCT: 27.6 % — ABNORMAL LOW (ref 39.0–52.0)
Hemoglobin: 9.1 g/dL — ABNORMAL LOW (ref 13.0–17.0)
MCH: 29.3 pg (ref 26.0–34.0)
MCHC: 33 g/dL (ref 30.0–36.0)
MCV: 88.7 fL (ref 80.0–100.0)
Platelets: 107 10*3/uL — ABNORMAL LOW (ref 150–400)
RBC: 3.11 MIL/uL — ABNORMAL LOW (ref 4.22–5.81)
RDW: 19.5 % — ABNORMAL HIGH (ref 11.5–15.5)
WBC: 5 10*3/uL (ref 4.0–10.5)
nRBC: 0 % (ref 0.0–0.2)

## 2021-03-03 LAB — GLUCOSE, CAPILLARY
Glucose-Capillary: 102 mg/dL — ABNORMAL HIGH (ref 70–99)
Glucose-Capillary: 108 mg/dL — ABNORMAL HIGH (ref 70–99)
Glucose-Capillary: 126 mg/dL — ABNORMAL HIGH (ref 70–99)
Glucose-Capillary: 134 mg/dL — ABNORMAL HIGH (ref 70–99)
Glucose-Capillary: 148 mg/dL — ABNORMAL HIGH (ref 70–99)
Glucose-Capillary: 75 mg/dL (ref 70–99)
Glucose-Capillary: 90 mg/dL (ref 70–99)

## 2021-03-03 LAB — COMPREHENSIVE METABOLIC PANEL
ALT: 18 U/L (ref 0–44)
AST: 44 U/L — ABNORMAL HIGH (ref 15–41)
Albumin: 2.4 g/dL — ABNORMAL LOW (ref 3.5–5.0)
Alkaline Phosphatase: 64 U/L (ref 38–126)
Anion gap: 9 (ref 5–15)
BUN: 10 mg/dL (ref 8–23)
CO2: 22 mmol/L (ref 22–32)
Calcium: 8.1 mg/dL — ABNORMAL LOW (ref 8.9–10.3)
Chloride: 103 mmol/L (ref 98–111)
Creatinine, Ser: 0.8 mg/dL (ref 0.61–1.24)
GFR, Estimated: >60 mL/min (ref >60)
Glucose, Bld: 126 mg/dL — ABNORMAL HIGH (ref 70–99)
Potassium: 3.2 mmol/L — ABNORMAL LOW (ref 3.5–5.1)
Sodium: 134 mmol/L — ABNORMAL LOW (ref 135–145)
Total Bilirubin: 1.8 mg/dL — ABNORMAL HIGH (ref 0.3–1.2)
Total Protein: 5.6 g/dL — ABNORMAL LOW (ref 6.5–8.1)

## 2021-03-03 LAB — MAGNESIUM: Magnesium: 1.6 mg/dL — ABNORMAL LOW (ref 1.7–2.4)

## 2021-03-03 MED ORDER — LACTATED RINGERS IV BOLUS
500.0000 mL | Freq: Once | INTRAVENOUS | Status: AC
  Administered 2021-03-03: 500 mL via INTRAVENOUS

## 2021-03-03 MED ORDER — MORPHINE SULFATE (PF) 2 MG/ML IV SOLN
2.0000 mg | INTRAVENOUS | Status: DC | PRN
Start: 2021-03-03 — End: 2021-03-06

## 2021-03-03 MED ORDER — MAGNESIUM SULFATE 2 GM/50ML IV SOLN
2.0000 g | Freq: Once | INTRAVENOUS | Status: AC
  Administered 2021-03-03: 2 g via INTRAVENOUS
  Filled 2021-03-03: qty 50

## 2021-03-03 MED ORDER — POTASSIUM CHLORIDE CRYS ER 20 MEQ PO TBCR
40.0000 meq | EXTENDED_RELEASE_TABLET | Freq: Two times a day (BID) | ORAL | Status: AC
  Administered 2021-03-03 – 2021-03-04 (×4): 40 meq via ORAL
  Filled 2021-03-03 (×4): qty 2

## 2021-03-03 MED ORDER — SODIUM CHLORIDE 0.9 % IV SOLN
INTRAVENOUS | Status: DC | PRN
  Administered 2021-03-03: 500 mL via INTRAVENOUS

## 2021-03-03 NOTE — Progress Notes (Signed)
Came back to examine patient for follow-up in the afternoon.  Earlier, he was given break from BiPAP and put on nonrebreather to help him eat, he became very short of breath and saturations down to 67% so put back on BiPAP.  He could not eat a meal.  Patient tells me he is comfortable currently on BiPAP, he is on 60 percent FiO2 and saturating adequate.  Watching TV.  He tells me that he knows he is sick but not sure if he is going to recover or not.  He tells me is hungry.  Denies any chest pain or shortness of breath or distress at this time. I told him he may be dying soon, he keeps quiet. He tells me that he has a brother but is not in contact with him for many many years and does not know where he is.  I discussed with patient that it is becoming very difficult to treat, he has multiple issues and severe medical conditions and he may pass away.  Was not sure whether he undistorted this or not.  Plan: Will continue all supportive care including dextrose infusion, BiPAP support. He is DNR/DNI, futile to do any CPR with severe medical conditions. Currently stable, if any discomfort or distress, will use comfort care medications and treat him with comfort care measures. Appreciate palliative care team help.

## 2021-03-03 NOTE — Progress Notes (Signed)
Patient appears comfortable at this time and has no complaints concerning his breathing. He remains off BiPAP, watching tv and in NAD. NRB mask in use with SpO2 100%. BiPAP remains on standby for further use in the event of worsening respiratory  compromise. Order changed to prn. RN aware.

## 2021-03-03 NOTE — Progress Notes (Signed)
Daily Progress Note   Patient Name: Roy Ryan       Date: 03/03/2021 DOB: [birth year]  Age: 70 y.o. MRN#: [id number] Attending Physician: Dorcas Carrow, MD Primary Care Physician: Patient, No Pcp Per (Inactive) Admit Date: 02/22/2021  Reason for Consultation/Follow-up: Establishing goals of care  Subjective: Patient was seen and examined today, remains on BIPAP, in stepdown unit. Awakens and attempts to converse normally on the BIPAP. Denies pain, denies shortness of breath. States that he doesn't have any next of kin, does not have the number for any friend/family member. Wants to get better so that he can go back to his motel in [place].    Length of Stay: 9  Current Medications: Scheduled Meds:   (feeding supplement) PROSource Plus  30 mL Oral BID BM   amiodarone  200 mg Oral BID   chlorhexidine  15 mL Mouth Rinse BID   Chlorhexidine Gluconate Cloth  6 each Topical Daily   feeding supplement  237 mL Oral BID BM   feeding supplement  237 mL Oral Q24H   folic acid  1 mg Oral Daily   furosemide  40 mg Intravenous Daily   heparin injection (subcutaneous)  5,000 Units Subcutaneous Q8H   hydrocortisone sod succinate (SOLU-CORTEF) inj  75 mg Intravenous Q12H   mouth rinse  15 mL Mouth Rinse q12n4p   multivitamin with minerals  1 tablet Oral Daily   pantoprazole  40 mg Oral Daily   pneumococcal 23 valent vaccine  0.5 mL Intramuscular Tomorrow-1000   potassium chloride  40 mEq Oral BID   sodium chloride flush  10-40 mL Intracatheter Q12H   thiamine  100 mg Oral Daily   Or   thiamine  100 mg Intravenous Daily    Continuous Infusions:  dextrose 50 mL/hr at 03/03/21 0700   magnesium sulfate bolus IVPB      PRN Meds: acetaminophen **OR** acetaminophen, sodium chloride  flush  Physical Exam     General: Awakens easily, on the BIPAP.  HEENT: No bruits, no goiter, no JVD Heart: Tachycardic.  Has systolic murmur Lungs: Decreased air movement, coarse throughout Abdomen: Soft, nontender, nondistended, positive bowel sounds.   Ext: has some edema Skin: Warm and dry    Vital Signs: BP 94/61   Pulse 94   Temp 97.7 F (36.5 C) (Oral)  Resp 18   Ht 6\' 2"  (1.88 m)   Wt 77.5 kg   SpO2 98%   BMI 21.94 kg/m  SpO2: SpO2: 98 % O2 Device: O2 Device: Bi-PAP O2 Flow Rate: O2 Flow Rate (L/min): 6 L/min  Intake/output summary:  Intake/Output Summary (Last 24 hours) at 03/03/2021 0931 Last data filed at 03/03/2021 0700 Gross per 24 hour  Intake 2320.31 ml  Output 1975 ml  Net 345.31 ml    LBM: Last BM Date: 03/02/21 Baseline Weight: Weight: 53 kg Most recent weight: Weight: 77.5 kg       Palliative Assessment/Data:    Flowsheet Rows    Flowsheet Row Most Recent Value  Intake Tab   Referral Department Hospitalist  Unit at Time of Referral Med/Surg Unit  Palliative Care Primary Diagnosis Cardiac  Date Notified 02/25/21  Palliative Care Type New Palliative care  Reason for referral Clarify Goals of Care  Date of Admission 02/22/21  Date first seen by Palliative Care 02/27/21  # of days Palliative referral response time 2 Day(s)  # of days IP prior to Palliative referral 3  Clinical Assessment   Palliative Performance Scale Score 40%  Psychosocial & Spiritual Assessment   Palliative Care Outcomes   Patient/Family meeting held? Yes  Who was at the meeting? Patient       Patient Active Problem List   Diagnosis Date Noted   Nonrheumatic mitral valve regurgitation    Hypotension 02/23/2021   HFrEF (heart failure with reduced ejection fraction) (HCC) 02/23/2021   Atrial fibrillation with rapid ventricular response (HCC) 02/23/2021   Hypomagnesemia 02/23/2021   Chronic alcoholic liver disease (HCC) 02/23/2021   Hypoalbuminemia 02/23/2021    Failure to thrive in adult 02/23/2021   Mild protein-calorie malnutrition (HCC) 02/23/2021   Thrombocytopenia (HCC) 02/23/2021   Hypoglycemia 02/23/2021   Hypothermia 02/23/2021   Protein-calorie malnutrition, severe 02/23/2021   Chest pain 01/31/2020    Palliative Care Assessment & Plan   Patient Profile: 70 y.o. male  with past medical history of alcohol abuse, alcohol withdrawal, atrial flutter, pulmonary hypertension admitted on 02/22/2021 after being found down hotel with hypoglycemia and living in poor conditions.  He was hypothermic and placed on antibiotics for sepsis no source of infection was identified.  He was found to have adrenal insufficiency and started on treatment.  He also had A. fib with RVR and was evaluated by cardiology.  Subsequent work-up revealed severe mitral regurg with prolapse.  Determined to be a poor candidate for surgical intervention and recommendation was for palliative care evaluation.  Recommendations/Plan: DNR/DNI Continue current care. He declined to name a surrogate decision maker: States he has a 02/24/2021 does not know her contact information.  States that he lives in a motel in [place].  Guarded prognosis: Patient noted to have severe mitral regurgitation, echocardiogram reviewed.  Also has severe physical deconditioning, poor oral intake.  Has low blood sugars for which he remains on D10 drip. Blood pressures have been on the low side, he has essentially not been able to come off of the BIPAP.  Discussed frankly but compassionately with the patient about his current condition. He asks "Am I dying?" Discussed frankly but compassionately with the patient that he remains at high risk for ongoing decline and decompensation given his current situation. I brought up hospice philosophy of care and described to him about the concept of "comfort care" and the type of care that is given inside a residential hospice facility. Patient states that he really  hopes to get better, his wishes are to go back to his motel in [place]. He seems to have some understanding of the serious nature of his illness.  Recommend continuation of current measures and follow up from PMT to assess his hospital course and his overall disease trajectory. If the patient has no significant or meaningful recovery in the next few days/if he becomes BIPAP dependent with ongoing decline in nutrition and function, then, would recommend comfort care and residential hospice at that time. PMT to follow.     Code Status:    Code Status Orders  (From admission, onward)           Start     Ordered   03/01/21 1836  Do not attempt resuscitation (DNR)  Continuous       Question Answer Comment  In the event of cardiac or respiratory ARREST Do not call a "code blue"   In the event of cardiac or respiratory ARREST Do not perform Intubation, CPR, defibrillation or ACLS   In the event of cardiac or respiratory ARREST Use medication by any route, position, wound care, and other measures to relive pain and suffering. May use oxygen, suction and manual treatment of airway obstruction as needed for comfort.      03/01/21 1835           Code Status History     Date Active Date Inactive Code Status Order ID Comments User Context   02/22/2021 2352 03/01/2021 1835 Full Code [id number]  John Giovanni, MD Inpatient   01/31/2020 1522 02/01/2020 1929 Full Code [id number]  Jae Dire, MD Inpatient       Discharge Planning: To Be Determined  Care plan was discussed with Patient, RN,   Thank you for allowing the Palliative Medicine Team to assist in the care of this patient.       Total Time 35 Prolonged Time Billed  no       Greater than 50%  of this time was spent counseling and coordinating care related to the above assessment and plan.  Rosalin Hawking, MD  Please contact Palliative Medicine Team phone at [phone] for questions and concerns.

## 2021-03-03 NOTE — Progress Notes (Signed)
PROGRESS NOTE    Roy Ryan  ZOX:[id number] DOB: 1951-06-30 DOA: 02/22/2021 PCP: Patient, No Pcp Per (Inactive)    Brief Narrative:  70 year old male with medical history of alcohol abuse, alcohol withdrawal seizures, atypical flutter, pulmonary hypertension presented to ED via EMS for evaluation of altered mental status.  Patient currently residing at hotel and was found to be hypoglycemic with CBG 37.  EMS reported poor living conditions, feces and urine bed, rotting food, multiple alcohol bottles.  Blood pressure was soft was given IV fluid bolus.  In the ED he was hypothermic with rectal temperature 95.2.  He was placed on [name]  Initially placed on empiric antibiotics for severe sepsis however no clear source of infection identified.  Due to persistent hypotension PCCM was consulted, found to have very low cortisol 2.1, cosyntropin stim ordered for adrenalinsufficiency.  He received IV fluid boluses, noted on pressor support.  Patient also found to be in A. fib with RVR.  Cardiology consulted.  Chest x-ray also showed fractures at C5-6-7.  Assessment & Plan:   Principal Problem:   Hypotension Active Problems:   Atrial fibrillation with rapid ventricular response (HCC)   Hypomagnesemia   Hypoglycemia   Hypothermia   Protein-calorie malnutrition, severe   Nonrheumatic mitral valve regurgitation  Toxic metabolic encephalopathy: Multifactorial.  Currently noted to be back to baseline. EEG with moderate diffuse encephalopathy, no seizures or epilepsy.  CT head unremarkable. B12 and ammonia levels normal.  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  No infectious source was identified.  Started on empiric antibiotics and was discontinued. Blood pressure responded to steroid replacement.  Will change to oral when adequately stabilized blood pressures and blood sugars.  Multiple compression fractures cervical and  thoracic spine: Symptomatic treatment.  New onset A. fib with RVR: Seen by cardiology.  Started on Eliquis.  Initial plan for cardioversion.  Currently on amiodarone. Rate controlled.  Remains on A. fib.  Alcohol abuse with withdrawals: Currently out of withdrawal window.  Subclinical hypothyroidism: TSH 7.1.  T4 1.2.  Started on thyroxine.  Hypomagnesemia/hypokalemia: Replace aggressively today.  Recheck levels.  Homelessness: Will need placement.  Not medically stable yet.  Mitral regurgitation with prolapse: Severe MR.  Poor surgical candidate.  Palliative following.  Hypoglycemia: Due to poor oral intake.  On dextrose infusion.  Hopefully steroids will stabilize his blood sugars.    DVT prophylaxis: heparin injection 5,000 Units Start: 02/25/21 1400   Code Status: DNR/DNI Family Communication: None Disposition Plan: Status is: Inpatient  Remains inpatient appropriate because:Inpatient level of care appropriate due to severity of illness  Dispo: The patient is from: Home              Anticipated d/c is to: SNF              Patient currently is not medically stable to d/c.   Difficult to place patient No         Consultants:  PCCM  Procedures:  None  Antimicrobials:  None   Subjective: Patient seen and examined.  Overnight on BiPAP.  Looks comfortable.  He denies any complaints now.  We will need evaluation after he is off the BiPAP.  Objective: Vitals:   03/03/21 0400 03/03/21 0500 03/03/21 0600 03/03/21 0800  BP: (!) 98/53 94/62 103/77   Pulse: 92 (!) 105 93   Resp: (!) Temp:    97.7 F (36.5 C)  TempSrc:  Oral  SpO2: 99% 97% (!) 82%   Weight:      Height:        Intake/Output Summary (Last 24 hours) at 03/03/2021 0834 Last data filed at 03/03/2021 0700 Gross per 24 hour  Intake 2320.31 ml  Output 1975 ml  Net 345.31 ml   Filed Weights   02/22/21 2308 03/01/21 1536  Weight: 53 kg 77.5 kg    Examination:  General exam:  Appears calm and comfortable Chronically sick looking.  Frail and debilitated.  Currently on 50% FiO2 on BiPAP.  97% saturating. Respiratory system: Bilateral clear.  No added sounds. Cardiovascular system: S1 & S2 heard, irregularly irregular.   Gastrointestinal system: Soft and nontender.   Central nervous system: Alert and oriented. No focal neurological deficits. Moves all extremities. Flat affect. Extremities: Symmetric 5 x 5 power.  Moves all extremities.    Data Reviewed: I have personally reviewed following labs and imaging studies  CBC: Recent Labs  Lab 02/27/21 0836 02/28/21 0553 03/01/21 0405 03/02/21 0243 03/03/21 0240  WBC 5.1 5.0 6.7 8.0 5.0  HGB 8.2* 8.6* 8.7* 9.5* 9.1*  HCT 24.4* 25.3* 25.6* 29.6* 27.6*  MCV 87.5 84.9 85.6 91.4 88.7  PLT 79* 91* 122* 123* 107*   Basic Metabolic Panel: Recent Labs  Lab 02/25/21 0357 02/25/21 1750 02/27/21 0836 02/28/21 0553 03/01/21 0405 03/03/21 0240  NA 135  --  131* 134* 135 134*  K 4.2  --  3.4* 3.8 3.7 3.2*  CL 108  --  107 110 110 103  CO2 19*  --  22 18* 18* 22  GLUCOSE 156*  --  95 94 107* 126*  BUN 6*  --  9 11 13 10   CREATININE 0.92  --  0.86 0.63 0.86 0.80  CALCIUM 8.7*  --  7.9* 8.2* 8.3* 8.1*  MG  --  1.7  --   --   --  1.6*   GFR: Estimated Creatinine Clearance: 98.2 mL/min (by C-G formula based on SCr of 0.8 mg/dL). Liver Function Tests: Recent Labs  Lab 02/25/21 0357 02/27/21 0836 02/28/21 0553 03/01/21 0405 03/03/21 0240  AST 47* 45* 44* 43* 44*  ALT 13 15 16 16 18   ALKPHOS 67 62 64 65 64  BILITOT 1.5* 1.7* 1.4* 1.8* 1.8*  PROT 6.2* 5.4* 6.2* 6.2* 5.6*  ALBUMIN 2.9* 2.5* 2.8* 2.8* 2.4*   No results for input(s): LIPASE, AMYLASE in the last 168 hours. No results for input(s): AMMONIA in the last 168 hours. Coagulation Profile: No results for input(s): INR, PROTIME in the last 168 hours. Cardiac Enzymes: No results for input(s): CKTOTAL, CKMB, CKMBINDEX, TROPONINI in the last 168  hours. BNP (last 3 results) No results for input(s): PROBNP in the last 8760 hours. HbA1C: Recent Labs    03/02/21 0243  HGBA1C 5.2   CBG: Recent Labs  Lab 03/02/21 1635 03/02/21 2002 03/03/21 0017 03/03/21 0313 03/03/21 0800  GLUCAP 102* 72 90 108* 134*   Lipid Profile: No results for input(s): CHOL, HDL, LDLCALC, TRIG, CHOLHDL, LDLDIRECT in the last 72 hours. Thyroid Function Tests: No results for input(s): TSH, T4TOTAL, FREET4, T3FREE, THYROIDAB in the last 72 hours. Anemia Panel: No results for input(s): VITAMINB12, FOLATE, FERRITIN, TIBC, IRON, RETICCTPCT in the last 72 hours. Sepsis Labs: No results for input(s): PROCALCITON, LATICACIDVEN in the last 168 hours.  Recent Results (from the past 240 hour(s))  Resp Panel by RT-PCR (Flu A&B, Covid) Nasopharyngeal Swab     Status: None   Collection  Time: 02/22/21  5:04 PM   Specimen: Nasopharyngeal Swab; Nasopharyngeal(NP) swabs in vial transport medium  Result Value Ref Range Status   SARS Coronavirus 2 by RT PCR NEGATIVE NEGATIVE Final    Comment: (NOTE) SARS-CoV-2 target nucleic acids are NOT DETECTED.  The SARS-CoV-2 RNA is generally detectable in upper respiratory specimens during the acute phase of infection. The lowest concentration of SARS-CoV-2 viral copies this assay can detect is 138 copies/mL. A negative result does not preclude SARS-Cov-2 infection and should not be used as the sole basis for treatment or other patient management decisions. A negative result may occur with  improper specimen collection/handling, submission of specimen other than nasopharyngeal swab, presence of viral mutation(s) within the areas targeted by this assay, and inadequate number of viral copies(<138 copies/mL). A negative result must be combined with clinical observations, patient history, and epidemiological information. The expected result is Negative.  Fact Sheet for Patients:   BloggerCourse.com  Fact Sheet for Healthcare Providers:  SeriousBroker.it  This test is no t yet approved or cleared by the [place] FDA and  has been authorized for detection and/or diagnosis of SARS-CoV-2 by FDA under an Emergency Use Authorization (EUA). This EUA will remain  in effect (meaning this test can be used) for the duration of the COVID-19 declaration under Section 564(b)(1) of the Act, 21 U.S.C.section 360bbb-3(b)(1), unless the authorization is terminated  or revoked sooner.       Influenza A by PCR NEGATIVE NEGATIVE Final   Influenza B by PCR NEGATIVE NEGATIVE Final    Comment: (NOTE) The Xpert Xpress SARS-CoV-2/FLU/RSV plus assay is intended as an aid in the diagnosis of influenza from Nasopharyngeal swab specimens and should not be used as a sole basis for treatment. Nasal washings and aspirates are unacceptable for Xpert Xpress SARS-CoV-2/FLU/RSV testing.  Fact Sheet for Patients: BloggerCourse.com  Fact Sheet for Healthcare Providers: SeriousBroker.it  This test is not yet approved or cleared by the [place] FDA and has been authorized for detection and/or diagnosis of SARS-CoV-2 by FDA under an Emergency Use Authorization (EUA). This EUA will remain in effect (meaning this test can be used) for the duration of the COVID-19 declaration under Section 564(b)(1) of the Act, 21 U.S.C. section 360bbb-3(b)(1), unless the authorization is terminated or revoked.  Performed at [facility], [address], [place], [place] [address]   MRSA Next Gen by PCR, Nasal     Status: None   Collection Time: 02/22/21 11:27 PM   Specimen: Nasal Mucosa; Nasal Swab  Result Value Ref Range Status   MRSA by PCR Next Gen NOT DETECTED NOT DETECTED Final    Comment: (NOTE) The GeneXpert MRSA Assay (FDA approved for NASAL specimens only), is one  component of a comprehensive MRSA colonization surveillance program. It is not intended to diagnose MRSA infection nor to guide or monitor treatment for MRSA infections. Test performance is not FDA approved in patients less than 69 years old. Performed at [facility], [address], [place], [place] [address]   Culture, blood (routine x 2)     Status: None   Collection Time: 02/23/21 12:47 AM   Specimen: BLOOD  Result Value Ref Range Status   Specimen Description   Final    BLOOD BLOOD RIGHT HAND Performed at [facility], [address], [place], [place] [address]    Special Requests   Final    BOTTLES DRAWN AEROBIC ONLY Blood Culture adequate volume Performed at [facility], [address] [place], [place] [address]    Culture   Final    NO GROWTH 5 DAYS Performed at [facility], [address], [place], [place] [address]    Report Status 02/28/2021 FINAL  Final  Culture, blood (routine x 2)     Status: None   Collection Time: 02/23/21 12:47 AM   Specimen: BLOOD  Result Value Ref Range Status   Specimen Description   Final    BLOOD BLOOD LEFT HAND Performed at [facility], [address], [place], [place] [address]    Special Requests   Final    BOTTLES DRAWN AEROBIC ONLY Blood Culture adequate volume Performed at [facility], [address] [place], [place] [address]    Culture   Final    NO GROWTH 5 DAYS Performed at [facility], [address], [place], [place] [address]    Report Status 02/28/2021 FINAL  Final  MRSA Next Gen by PCR, Nasal     Status: None   Collection Time: 03/01/21  4:00 PM   Specimen: Nasal Mucosa; Nasal Swab  Result Value Ref Range Status   MRSA by PCR Next Gen NOT DETECTED NOT DETECTED Final    Comment: (NOTE) The GeneXpert MRSA Assay (FDA approved for NASAL specimens only), is one component of a comprehensive MRSA colonization  surveillance program. It is not intended to diagnose MRSA infection nor to guide or monitor treatment for MRSA infections. Test performance is not FDA approved in patients less than 28 years old. Performed at [facility], [address], [place], [place] [address]          Radiology Studies: DG Abd 1 View  Result Date: 03/01/2021 CLINICAL DATA:  Abdominal pain for 2 days EXAM: ABDOMEN - 1 VIEW COMPARISON:  Chest from 02/22/2021 FINDINGS: Scattered large and small bowel gas is noted. No free air is seen. Increased central vascular congestion is noted when compare with the recent exam. No bony abnormality is noted. Left hip replacement is noted. IMPRESSION: No obstructive changes are seen. Increased central vascular congestion. Electronically Signed   By: Alcide Clever M.D.   On: 03/01/2021 09:58   DG CHEST PORT 1 VIEW  Result Date: 03/01/2021 CLINICAL DATA:  Shortness of breath, altered mental status EXAM: PORTABLE CHEST 1 VIEW COMPARISON:  02/22/2021 FINDINGS: Cardiomegaly. There is new, diffuse bilateral heterogeneous and interstitial airspace opacity and small layering bilateral pleural effusions. IMPRESSION: Cardiomegaly with new, diffuse bilateral heterogeneous and interstitial airspace opacity and small layering bilateral pleural effusions, concerning for pulmonary edema. Electronically Signed   By: Lauralyn Primes M.D.   On: 03/01/2021 16:14        Scheduled Meds:  (feeding supplement) PROSource Plus  30 mL Oral BID BM   amiodarone  200 mg Oral BID   chlorhexidine  15 mL Mouth Rinse BID   Chlorhexidine Gluconate Cloth  6 each Topical Daily   feeding supplement  237 mL Oral BID BM   feeding supplement  237 mL Oral Q24H   folic acid  1 mg Oral Daily   furosemide  40 mg Intravenous Daily   heparin injection (subcutaneous)  5,000 Units Subcutaneous Q8H   hydrocortisone sod succinate (SOLU-CORTEF) inj  75 mg Intravenous Q12H   mouth rinse  15 mL Mouth Rinse  q12n4p   multivitamin with minerals  1 tablet Oral Daily   pantoprazole  40 mg Oral Daily   pneumococcal  23 valent vaccine  0.5 mL Intramuscular Tomorrow-1000   potassium chloride  40 mEq Oral BID   sodium chloride flush  10-40 mL Intracatheter Q12H   thiamine  100 mg Oral Daily   Or   thiamine  100 mg Intravenous Daily   Continuous Infusions:  dextrose 50 mL/hr at 03/03/21 0700   magnesium sulfate bolus IVPB       LOS: 9 days    Time spent: 30 minutes    Dorcas CarrowKuber Beula Joyner, MD [facility] Pager [phone]

## 2021-03-03 NOTE — TOC Progression Note (Signed)
Transition of Care [facility]) - Progression Note    Patient Details  Name: Roy Ryan MRN: [id number] Date of Birth: 1951-05-15  Transition of Care [facility]) CM/SW Contact  Golda Acre, RN Phone Number: 03/03/2021, 7:35 AM  Clinical Narrative:    Assessment & Plan:   Principal Problem:   Hypotension Active Problems:   Atrial fibrillation with rapid ventricular response (HCC)   Hypomagnesemia   Hypoglycemia   Hypothermia   Protein-calorie malnutrition, severe   Nonrheumatic mitral valve regurgitation   Toxic metabolic encephalopathy -Noted to be back to baseline, appropriately conversant -EEG showed moderate diffuse encephalopathy, no seizures or epileptiform discharges seen -CT head was unremarkable, showed chronic right maxillary sinus disease, air-fluid in the left frontal sinus may represent acute or chronic sinusitis -Recent B12 2040, ammonia 22 -Presenting UDS noted to be negative -More encephalopathic today   Hypotension/adrenal insufficiency -Likely due to adrenal insufficiency -Cortisone stimulation test was done, reviewed, confirms adrenal insufficiency -No infectious source identified; chest x-ray showed no acute cardiopulmonary disease, lactic acid 1.5, procalcitonin less than 0.10, WBC 4.3, UA was clear -Was started on empiric antibiotics, PCCM was later consulted -Did not require pressor support, bp has since remained stable since starting steroids   Thoracic compression fracture  -Initial chest x-ray chest x-ray showed progression fractures at C5-6-7 -Noted to have compression fractures at T5-T6 and T7 in thoracic spine which are stable from CT scan from 01/01/2020.  Without evidence of progression   New onset atrial fibrillation with RVR -Started on apixaban 5 mg twice a day; CHA2DS2VASc score of 1 -Cardiology initially planned for TEE/DCCV, however given hx noncompliance and social issues, patient is not a surgical candidate per cardiology -See below,  palliative care was consulted.  With recent decompensation, patient has decided on DNR and DNI status.  Given his incurable disease and poor overall prognosis, his wishes are very much appropriate   Alcohol abuse -Had been continued on CIWA protocol -Continue thiamine   Subclinical hypothyroidism -TSH 7.133, T4 1.21 -Recommend repeating TSH in 6 to 8 weeks   Hypomagnesemia -Cont to replace lytes as needed -cont to follow electrolytes and replace as needed   Hyperbilirubinemia -Unclear etiology, likely shock liver from hypotension -LFT trends have improved over time   Hypokalemia -Had been stable recently -Continue to replace lytes as needed   Sinusitis -Seen on CT head -Completed course of Augmentin   Alcohol abuse -Without evidence of alcohol withdrawal -Patient had been continued with CIWA protocol   Homeless issues -TOC following   Pancytopenia -Mild; likely from chronic alcohol abuse   Mitral regurg with prolapse  -Pt is now s/p TEE with evidence of severe MR with a component of acute MR likely present. Noted to be a poor candidate for surgical repair -Cardiology has recommended f/u with Palliative Care to establish goals of care -Appreciate assistance by palliative care.   -Recently noted to have increased O2 requirements and respiratory effort. -MAP response was called and patient was brought to ICU.  Stat chest x-ray ordered and reviewed personally, findings notable for pulmonary edema -Patient is now on BiPAP support.  We will continue Lasix diuresis as tolerated -See palliative care note from 03/01/2021.  Patient's wishes are now noted to be DNR/DNI   Hypoglycemia -Patient noted to have glucose in the 30-40 range that was resistant to D50 injections on 03/01/2021 -Following multiple doses of D50 as well as D10 IV fluids, glucose trends have since improved -Patient is continued on dextrose containing IV fluids TOC  PLAN OF CARE AND PROGRESSION: Reports having  poor living conditions, lives alone and intakes etoh. Will follow for changes, currently on bipap at 50%, iv d10, iv solu cortef.  Bld glucose =126.       Expected Discharge Plan and Services                                                 Social Determinants of Health (SDOH) Interventions    Readmission Risk Interventions No flowsheet data found.

## 2021-03-04 LAB — BASIC METABOLIC PANEL
Anion gap: 9 (ref 5–15)
BUN: 11 mg/dL (ref 8–23)
CO2: 22 mmol/L (ref 22–32)
Calcium: 8 mg/dL — ABNORMAL LOW (ref 8.9–10.3)
Chloride: 103 mmol/L (ref 98–111)
Creatinine, Ser: 0.77 mg/dL (ref 0.61–1.24)
GFR, Estimated: >60 mL/min (ref >60)
Glucose, Bld: 139 mg/dL — ABNORMAL HIGH (ref 70–99)
Potassium: 3.5 mmol/L (ref 3.5–5.1)
Sodium: 134 mmol/L — ABNORMAL LOW (ref 135–145)

## 2021-03-04 LAB — CBC WITH DIFFERENTIAL/PLATELET
Abs Immature Granulocytes: 0.03 10*3/uL (ref 0.00–0.07)
Basophils Absolute: 0 10*3/uL (ref 0.0–0.1)
Basophils Relative: 0 %
Eosinophils Absolute: 0 10*3/uL (ref 0.0–0.5)
Eosinophils Relative: 0 %
HCT: 25.4 % — ABNORMAL LOW (ref 39.0–52.0)
Hemoglobin: 8.7 g/dL — ABNORMAL LOW (ref 13.0–17.0)
Immature Granulocytes: 1 %
Lymphocytes Relative: 16 %
Lymphs Abs: 1 10*3/uL (ref 0.7–4.0)
MCH: 29.3 pg (ref 26.0–34.0)
MCHC: 34.3 g/dL (ref 30.0–36.0)
MCV: 85.5 fL (ref 80.0–100.0)
Monocytes Absolute: 0.7 10*3/uL (ref 0.1–1.0)
Monocytes Relative: 11 %
Neutro Abs: 4.5 10*3/uL (ref 1.7–7.7)
Neutrophils Relative %: 72 %
Platelets: 120 10*3/uL — ABNORMAL LOW (ref 150–400)
RBC: 2.97 MIL/uL — ABNORMAL LOW (ref 4.22–5.81)
RDW: 19.1 % — ABNORMAL HIGH (ref 11.5–15.5)
WBC: 6.2 10*3/uL (ref 4.0–10.5)
nRBC: 0 % (ref 0.0–0.2)

## 2021-03-04 LAB — MAGNESIUM: Magnesium: 1.9 mg/dL (ref 1.7–2.4)

## 2021-03-04 LAB — GLUCOSE, CAPILLARY
Glucose-Capillary: 144 mg/dL — ABNORMAL HIGH (ref 70–99)
Glucose-Capillary: 139 mg/dL — ABNORMAL HIGH (ref 70–99)
Glucose-Capillary: 155 mg/dL — ABNORMAL HIGH (ref 70–99)
Glucose-Capillary: 126 mg/dL — ABNORMAL HIGH (ref 70–99)
Glucose-Capillary: 90 mg/dL (ref 70–99)
Glucose-Capillary: 120 mg/dL — ABNORMAL HIGH (ref 70–99)

## 2021-03-04 LAB — PHOSPHORUS: Phosphorus: 1.6 mg/dL — ABNORMAL LOW (ref 2.5–4.6)

## 2021-03-04 MED ORDER — POTASSIUM PHOSPHATES 15 MMOLE/5ML IV SOLN
20.0000 mmol | Freq: Once | INTRAVENOUS | Status: AC
  Administered 2021-03-04: 20 mmol via INTRAVENOUS
  Filled 2021-03-04: qty 6.67

## 2021-03-04 NOTE — Progress Notes (Signed)
PROGRESS NOTE    Kayleb Warshaw  ZOX:[id number] DOB: 10/08/1950 DOA: 02/22/2021 PCP: Patient, No Pcp Per (Inactive)    Brief Narrative:  70 year old male with medical history of alcohol abuse, alcohol withdrawal seizures, atrial flutter, pulmonary hypertension presented to ED via EMS for evaluation of altered mental status.  Patient currently residing at hotel and was found to be hypoglycemic with CBG 37.  EMS reported poor living conditions, feces and urine bed, rotting food, multiple alcohol bottles.  Blood pressure was soft was given IV fluid bolus.  In the ED he was hypothermic with rectal temperature 95.2.  He was placed on [name]  Initially placed on empiric antibiotics for severe sepsis however no clear source of infection identified.  Due to persistent hypotension PCCM was consulted, found to have very low cortisol 2.1, cosyntropin ordered for adrenalinsufficiency.  He received IV fluid boluses, noted on pressor support.  Patient also found to be in A. fib with RVR.  Cardiology consulted.  Chest x-ray also showed fractures at C5-6-7. Patient remains in the hospital in very poor condition.  He is maintained on dextrose infusion for hypoglycemia and intermittently on BiPAP. 9/1, some improvement in mentation and oxygenation.  Will start working with physical therapy.  Assessment & Plan:   Principal Problem:   Hypotension Active Problems:   Atrial fibrillation with rapid ventricular response (HCC)   Hypomagnesemia   Failure to thrive in adult   Hypoglycemia   Hypothermia   Protein-calorie malnutrition, severe   Nonrheumatic mitral valve regurgitation   Palliative care by specialist   Goals of care, counseling/discussion   General weakness  Toxic metabolic encephalopathy: Multifactorial.  Mental status improving. EEG with moderate diffuse encephalopathy, no seizures or epilepsy.  CT head unremarkable. B12 and ammonia levels normal. Remains on vitamin supplements.  Hypotension  due to adrenal sufficiency: Cortisol stimulation test confirms evidence of adrenal insufficiency.  No infectious source was identified.  Started on empiric antibiotics and was discontinued. Blood pressure responded to steroid replacement.  Will change to oral when adequately stabilized blood pressures and blood sugars.  Multiple compression fractures cervical and thoracic spine: Symptomatic treatment.  New onset A. fib with RVR: Seen by cardiology.  Started on Eliquis.  Initial plan for cardioversion.  Currently on amiodarone. Rate controlled.  Remains on A. fib.  Alcohol abuse with withdrawals: Currently out of withdrawal window.  On vitamin supplements.  Subclinical hypothyroidism: TSH 7.1.  T4 1.2.  Started on thyroxine.  Hypomagnesemia/hypokalemia: Replaced aggressively.  Replace phosphorus IV today.  Homelessness: Will need placement.  Start working with PT OT today.  Mitral regurgitation with prolapse: Severe MR.  Poor surgical candidate.  Palliative following.  Hypoglycemia: Due to poor oral intake.  On dextrose infusion.  Hopefully steroids will stabilize his blood sugars. Today he is able to eat, will discontinue dextrose infusion and challenge him with oral intake.  Goal of care: Patient remains with severe medical morbidities and poor survival.  He is intermittently on high flow oxygen and on BiPAP at times.  He has multiple medical issues that indicate poor prognosis. Still maintained on IV dextrose to keep up with blood sugars. No family members or next of kin was able to be reached. Patient is DNR/DNI. He still hopes to get back to his hotel. Since he is somehow stabilizing today, will work on mobilizing with PT OT and anticipate referral to SNF if he can do some rehab.    DVT prophylaxis: heparin injection 5,000 Units Start: 02/25/21 1400  Code Status: DNR/DNI Family Communication: None Disposition Plan: Status is: Inpatient  Remains inpatient appropriate  because:Inpatient level of care appropriate due to severity of illness  Dispo: The patient is from: Home              Anticipated d/c is to: SNF              Patient currently is not medically stable to d/c.   Difficult to place patient No         Consultants:  PCCM  Procedures:  None  Antimicrobials:  None   Subjective: Patient was seen and examined in the morning rounds.  Today surprisingly he is more awake and conversant.  He tells me that he has a brother but not sure how he can connect to him. He was mostly on room air with his nonrebreather on the neck and was looking comfortable.  He was hungry.  He wanted to make sure that I turn his TV on before leaving. He was not sure he can walk.  Objective: Vitals:   03/04/21 0900 03/04/21 0930 03/04/21 1000 03/04/21 1030  BP: 111/76 113/65 100/73 111/77  Pulse: 98 99 87 84  Resp: (!) 30 (!) 26 19 17   Temp:      TempSrc:      SpO2: 99% 94% 91% 100%  Weight:      Height:        Intake/Output Summary (Last 24 hours) at 03/04/2021 1054 Last data filed at 03/04/2021 1000 Gross per 24 hour  Intake 1226.48 ml  Output 2201 ml  Net -974.52 ml   Filed Weights   02/22/21 2308 03/01/21 1536  Weight: 53 kg 77.5 kg    Examination: General: Chronically sick looking.  Frail and debilitated.  Not in any distress.  Mostly on room air today. Cardiovascular: S1-S2 normal.  Patient does have pansystolic murmur at the apex.  No other added sounds. Respiratory: Bilateral clear.  No crackles or wheezing. Gastrointestinal: Soft and nontender.  Bowel sounds present. Ext: Patient has 2+ edema mostly on the dorsum of the feet and ankle. Neuro: Alert and awake.  Mostly oriented.  Can move all extremities. Musculoskeletal: No joint deformities.      Data Reviewed: I have personally reviewed following labs and imaging studies  CBC: Recent Labs  Lab 02/28/21 0553 03/01/21 0405 03/02/21 0243 03/03/21 0240 03/04/21 0302  WBC 5.0  6.7 8.0 5.0 6.2  NEUTROABS  --   --   --   --  4.5  HGB 8.6* 8.7* 9.5* 9.1* 8.7*  HCT 25.3* 25.6* 29.6* 27.6* 25.4*  MCV 84.9 85.6 91.4 88.7 85.5  PLT 91* 122* 123* 107* 120*   Basic Metabolic Panel: Recent Labs  Lab 02/25/21 1750 02/27/21 0836 02/28/21 0553 03/01/21 0405 03/03/21 0240 03/04/21 0302  NA  --  131* 134* 135 134* 134*  K  --  3.4* 3.8 3.7 3.2* 3.5  CL  --  107 110 110 103 103  CO2  --  22 18* 18* 22 22  GLUCOSE  --  95 94 107* 126* 139*  BUN  --  9 11 13 10 11   CREATININE  --  0.86 0.63 0.86 0.80 0.77  CALCIUM  --  7.9* 8.2* 8.3* 8.1* 8.0*  MG 1.7  --   --   --  1.6* 1.9  PHOS  --   --   --   --   --  1.6*   GFR: Estimated Creatinine Clearance: 98.2 mL/min (  by C-G formula based on SCr of 0.77 mg/dL). Liver Function Tests: Recent Labs  Lab 02/27/21 0836 02/28/21 0553 03/01/21 0405 03/03/21 0240  AST 45* 44* 43* 44*  ALT 15 16 16 18   ALKPHOS 62 64 65 64  BILITOT 1.7* 1.4* 1.8* 1.8*  PROT 5.4* 6.2* 6.2* 5.6*  ALBUMIN 2.5* 2.8* 2.8* 2.4*   No results for input(s): LIPASE, AMYLASE in the last 168 hours. No results for input(s): AMMONIA in the last 168 hours. Coagulation Profile: No results for input(s): INR, PROTIME in the last 168 hours. Cardiac Enzymes: No results for input(s): CKTOTAL, CKMB, CKMBINDEX, TROPONINI in the last 168 hours. BNP (last 3 results) No results for input(s): PROBNP in the last 8760 hours. HbA1C: Recent Labs    03/02/21 0243  HGBA1C 5.2   CBG: Recent Labs  Lab 03/03/21 1629 03/03/21 1936 03/03/21 2342 03/04/21 0320 03/04/21 0805  GLUCAP 126* 75 102* 144* 139*   Lipid Profile: No results for input(s): CHOL, HDL, LDLCALC, TRIG, CHOLHDL, LDLDIRECT in the last 72 hours. Thyroid Function Tests: No results for input(s): TSH, T4TOTAL, FREET4, T3FREE, THYROIDAB in the last 72 hours. Anemia Panel: No results for input(s): VITAMINB12, FOLATE, FERRITIN, TIBC, IRON, RETICCTPCT in the last 72 hours. Sepsis Labs: No results  for input(s): PROCALCITON, LATICACIDVEN in the last 168 hours.  Recent Results (from the past 240 hour(s))  Resp Panel by RT-PCR (Flu A&B, Covid) Nasopharyngeal Swab     Status: None   Collection Time: 02/22/21  5:04 PM   Specimen: Nasopharyngeal Swab; Nasopharyngeal(NP) swabs in vial transport medium  Result Value Ref Range Status   SARS Coronavirus 2 by RT PCR NEGATIVE NEGATIVE Final    Comment: (NOTE) SARS-CoV-2 target nucleic acids are NOT DETECTED.  The SARS-CoV-2 RNA is generally detectable in upper respiratory specimens during the acute phase of infection. The lowest concentration of SARS-CoV-2 viral copies this assay can detect is 138 copies/mL. A negative result does not preclude SARS-Cov-2 infection and should not be used as the sole basis for treatment or other patient management decisions. A negative result may occur with  improper specimen collection/handling, submission of specimen other than nasopharyngeal swab, presence of viral mutation(s) within the areas targeted by this assay, and inadequate number of viral copies(<138 copies/mL). A negative result must be combined with clinical observations, patient history, and epidemiological information. The expected result is Negative.  Fact Sheet for Patients:  BloggerCourse.comhttps://www.fda.gov/media/152166/download  Fact Sheet for Healthcare Providers:  SeriousBroker.ithttps://www.fda.gov/media/152162/download  This test is no t yet approved or cleared by the [place] FDA and  has been authorized for detection and/or diagnosis of SARS-CoV-2 by FDA under an Emergency Use Authorization (EUA). This EUA will remain  in effect (meaning this test can be used) for the duration of the COVID-19 declaration under Section 564(b)(1) of the Act, 21 U.S.C.section 360bbb-3(b)(1), unless the authorization is terminated  or revoked sooner.       Influenza A by PCR NEGATIVE NEGATIVE Final   Influenza B by PCR NEGATIVE NEGATIVE Final    Comment: (NOTE) The  Xpert Xpress SARS-CoV-2/FLU/RSV plus assay is intended as an aid in the diagnosis of influenza from Nasopharyngeal swab specimens and should not be used as a sole basis for treatment. Nasal washings and aspirates are unacceptable for Xpert Xpress SARS-CoV-2/FLU/RSV testing.  Fact Sheet for Patients: BloggerCourse.comhttps://www.fda.gov/media/152166/download  Fact Sheet for Healthcare Providers: SeriousBroker.ithttps://www.fda.gov/media/152162/download  This test is not yet approved or cleared by the [place] FDA and has been authorized for detection and/or diagnosis  of SARS-CoV-2 by FDA under an Emergency Use Authorization (EUA). This EUA will remain in effect (meaning this test can be used) for the duration of the COVID-19 declaration under Section 564(b)(1) of the Act, 21 U.S.C. section 360bbb-3(b)(1), unless the authorization is terminated or revoked.  Performed at [facility], [address] [place], [place] [address]   MRSA Next Gen by PCR, Nasal     Status: None   Collection Time: 02/22/21 11:27 PM   Specimen: Nasal Mucosa; Nasal Swab  Result Value Ref Range Status   MRSA by PCR Next Gen NOT DETECTED NOT DETECTED Final    Comment: (NOTE) The GeneXpert MRSA Assay (FDA approved for NASAL specimens only), is one component of a comprehensive MRSA colonization surveillance program. It is not intended to diagnose MRSA infection nor to guide or monitor treatment for MRSA infections. Test performance is not FDA approved in patients less than 63 years old. Performed at [facility], [address] [place], [place] [address]   Culture, blood (routine x 2)     Status: None   Collection Time: 02/23/21 12:47 AM   Specimen: BLOOD  Result Value Ref Range Status   Specimen Description   Final    BLOOD BLOOD RIGHT HAND Performed at [facility], [address] [place], [place] [address]    Special Requests   Final    BOTTLES DRAWN AEROBIC ONLY Blood  Culture adequate volume Performed at [facility], [address] [place], [place] [address]    Culture   Final    NO GROWTH 5 DAYS Performed at [facility], [address] [place], [place] [address]    Report Status 02/28/2021 FINAL  Final  Culture, blood (routine x 2)     Status: None   Collection Time: 02/23/21 12:47 AM   Specimen: BLOOD  Result Value Ref Range Status   Specimen Description   Final    BLOOD BLOOD LEFT HAND Performed at [facility], [address], [place], [place] [address]    Special Requests   Final    BOTTLES DRAWN AEROBIC ONLY Blood Culture adequate volume Performed at [facility], [address], [place], [place] [address]    Culture   Final    NO GROWTH 5 DAYS Performed at [facility], [address], [place], [place] [address]    Report Status 02/28/2021 FINAL  Final  MRSA Next Gen by PCR, Nasal     Status: None   Collection Time: 03/01/21  4:00 PM   Specimen: Nasal Mucosa; Nasal Swab  Result Value Ref Range Status   MRSA by PCR Next Gen NOT DETECTED NOT DETECTED Final    Comment: (NOTE) The GeneXpert MRSA Assay (FDA approved for NASAL specimens only), is one component of a comprehensive MRSA colonization surveillance program. It is not intended to diagnose MRSA infection nor to guide or monitor treatment for MRSA infections. Test performance is not FDA approved in patients less than 58 years old. Performed at [facility], [address] [place], [place] [address]          Radiology Studies: No results found.      Scheduled Meds:  (feeding supplement) PROSource Plus  30 mL Oral BID BM   amiodarone  200 mg Oral BID   chlorhexidine  15 mL Mouth Rinse BID   Chlorhexidine Gluconate Cloth  6 each Topical Daily   feeding  supplement  237 mL Oral BID BM   feeding supplement  237 mL Oral Q24H   folic acid  1 mg Oral Daily   furosemide  40 mg  Intravenous Daily   heparin injection (subcutaneous)  5,000 Units Subcutaneous Q8H   hydrocortisone sod succinate (SOLU-CORTEF) inj  75 mg Intravenous Q12H   mouth rinse  15 mL Mouth Rinse q12n4p   multivitamin with minerals  1 tablet Oral Daily   pantoprazole  40 mg Oral Daily   pneumococcal 23 valent vaccine  0.5 mL Intramuscular Tomorrow-1000   potassium chloride  40 mEq Oral BID   sodium chloride flush  10-40 mL Intracatheter Q12H   thiamine  100 mg Oral Daily   Or   thiamine  100 mg Intravenous Daily   Continuous Infusions:  sodium chloride Stopped (03/03/21 1208)   potassium PHOSPHATE IVPB (in mmol) 85 mL/hr at 03/04/21 0910     LOS: 10 days    Time spent: 30 minutes    Dorcas Carrow, MD [facility] Pager [phone]

## 2021-03-04 NOTE — Progress Notes (Signed)
Pt is hypotensive, SBP sustaining in mid 80's. Pt denies dizziness or SOB. Pt is eating in bed and appears comfortable. Dr. Jerral Ralph paged by this RN and verbal orders received to continue to monitor BP at this time as long as pt remains asymptomatic.

## 2021-03-04 NOTE — Progress Notes (Signed)
Palliative Progress Note  HPI: 70 y.o. male  with past medical history of alcohol abuse, alcohol withdrawal, atrial flutter, pulmonary hypertension admitted on 02/22/2021 after being found down hotel with hypoglycemia and living in poor conditions.  He was hypothermic and placed on antibiotics for sepsis no source of infection was identified.  He was found to have adrenal insufficiency and started on treatment.  He also had A. fib with RVR and was evaluated by cardiology.  Subsequent work-up revealed severe mitral regurg with prolapse.  Determined to be a poor candidate for surgical intervention and recommendation was for palliative care evaluation.    Consultation and coordination of care:  I met with [name] at bedside this afternoon. He was asleep and resting comfortably in NAD with the NRB on. He awakened easily when he sensed my presence. I asked if he was in pain or had any concerns. He shook his head no. I reviewed that to see him off of the Bipap was an improvement. I reviewed that his BP was trending up. Given his decreased oxygen demand and stabilizing BP that the plan is to work with PT. He nodded his head yes. He was sleepy and quickly closed his eyes.   Palliative Medicine will continue to follow along with his medical team.  Time: 15 minutes   Loistine Chance MD, patient was seen along with Curt Bears L. Ilsa Iha, FNP-BC Palliative Medicine Team Team Phone # [phone] Pager # [phone]   Greater than 50%  of this time was spent counseling and coordinating care related to the above assessment and plan.

## 2021-03-04 NOTE — Progress Notes (Signed)
AM dose of amiodarone held by this RN d/t persistent hypotension. Dr Jerral Ralph notified by this RN.   RN also expressed concerns to MD in regard to patient's swallowing. With AM medications, pt continued to chew pills and displayed difficulty swallowing, despite being in high Fowler's position. Awaiting further orders at this time.

## 2021-03-05 LAB — COMPREHENSIVE METABOLIC PANEL
ALT: 36 U/L (ref 0–44)
AST: 91 U/L — ABNORMAL HIGH (ref 15–41)
Albumin: 2.5 g/dL — ABNORMAL LOW (ref 3.5–5.0)
Alkaline Phosphatase: 77 U/L (ref 38–126)
Anion gap: 6 (ref 5–15)
BUN: 13 mg/dL (ref 8–23)
CO2: 26 mmol/L (ref 22–32)
Calcium: 7.9 mg/dL — ABNORMAL LOW (ref 8.9–10.3)
Chloride: 103 mmol/L (ref 98–111)
Creatinine, Ser: 0.8 mg/dL (ref 0.61–1.24)
GFR, Estimated: >60 mL/min (ref >60)
Glucose, Bld: 106 mg/dL — ABNORMAL HIGH (ref 70–99)
Potassium: 4.7 mmol/L (ref 3.5–5.1)
Sodium: 135 mmol/L (ref 135–145)
Total Bilirubin: 0.9 mg/dL (ref 0.3–1.2)
Total Protein: 6.1 g/dL — ABNORMAL LOW (ref 6.5–8.1)

## 2021-03-05 LAB — CBC WITH DIFFERENTIAL/PLATELET
Abs Immature Granulocytes: 0.04 10*3/uL (ref 0.00–0.07)
Basophils Absolute: 0 10*3/uL (ref 0.0–0.1)
Basophils Relative: 0 %
Eosinophils Absolute: 0 10*3/uL (ref 0.0–0.5)
Eosinophils Relative: 0 %
HCT: 25.8 % — ABNORMAL LOW (ref 39.0–52.0)
Hemoglobin: 8.7 g/dL — ABNORMAL LOW (ref 13.0–17.0)
Immature Granulocytes: 1 %
Lymphocytes Relative: 17 %
Lymphs Abs: 1.1 10*3/uL (ref 0.7–4.0)
MCH: 29.3 pg (ref 26.0–34.0)
MCHC: 33.7 g/dL (ref 30.0–36.0)
MCV: 86.9 fL (ref 80.0–100.0)
Monocytes Absolute: 0.7 10*3/uL (ref 0.1–1.0)
Monocytes Relative: 10 %
Neutro Abs: 4.7 10*3/uL (ref 1.7–7.7)
Neutrophils Relative %: 72 %
Platelets: 118 10*3/uL — ABNORMAL LOW (ref 150–400)
RBC: 2.97 MIL/uL — ABNORMAL LOW (ref 4.22–5.81)
RDW: 19.9 % — ABNORMAL HIGH (ref 11.5–15.5)
WBC: 6.5 10*3/uL (ref 4.0–10.5)
nRBC: 0 % (ref 0.0–0.2)

## 2021-03-05 LAB — GLUCOSE, CAPILLARY
Glucose-Capillary: 109 mg/dL — ABNORMAL HIGH (ref 70–99)
Glucose-Capillary: 102 mg/dL — ABNORMAL HIGH (ref 70–99)
Glucose-Capillary: 105 mg/dL — ABNORMAL HIGH (ref 70–99)
Glucose-Capillary: 142 mg/dL — ABNORMAL HIGH (ref 70–99)
Glucose-Capillary: 159 mg/dL — ABNORMAL HIGH (ref 70–99)
Glucose-Capillary: 114 mg/dL — ABNORMAL HIGH (ref 70–99)

## 2021-03-05 LAB — PHOSPHORUS: Phosphorus: 1.8 mg/dL — ABNORMAL LOW (ref 2.5–4.6)

## 2021-03-05 LAB — MAGNESIUM: Magnesium: 2 mg/dL (ref 1.7–2.4)

## 2021-03-05 MED ORDER — HYDROCORTISONE 20 MG PO TABS
20.0000 mg | ORAL_TABLET | Freq: Two times a day (BID) | ORAL | Status: DC
  Administered 2021-03-05 – 2021-03-10 (×11): 20 mg via ORAL
  Filled 2021-03-05 (×12): qty 1

## 2021-03-05 NOTE — Evaluation (Signed)
Physical Therapy Evaluation Patient Details Name: Roy Ryan MRN: [id number] DOB: 1951-01-03 Today's Date: 03/05/2021   History of Present Illness  Pt admitted from hotel with hypoglycemia, hypotermia, hypotension, A-fib with RVR, and toxic metabolic encephalopathy.  Pt with hx of ETOH abuse, seizure, pumonary hypotension, atrial flutter and multiple spinal compression fx.  Clinical Impression  Pt admitted as above and presenting with functional mobility limitations 2* generalized weakness, very poor endurance, and balance deficits limiting functional mobility.  Pt would benefit from follow up rehab at SNF level to maximize IND and safety.    Follow Up Recommendations SNF    Equipment Recommendations  None recommended by PT    Recommendations for Other Services       Precautions / Restrictions Precautions Precautions: Fall Restrictions Weight Bearing Restrictions: No      Mobility  Bed Mobility Overal bed mobility: Needs Assistance Bed Mobility: Supine to Sit     Supine to sit: HOB elevated;Min assist     General bed mobility comments: Increased time    Transfers Overall transfer level: Needs assistance Equipment used: Rolling walker (2 wheeled) Transfers: Sit to/from Stand Sit to Stand: Min assist;+2 physical assistance;+2 safety/equipment;From elevated surface         General transfer comment: Physical assist to bring wt up and fwd and to balance in standing with RW  Ambulation/Gait Ambulation/Gait assistance: Min assist;+2 safety/equipment;+2 physical assistance Gait Distance (Feet): 5 Feet Assistive device: Rolling walker (2 wheeled) Gait Pattern/deviations: Step-to pattern;Decreased step length - right;Decreased step length - left;Shuffle;Trunk flexed Gait velocity: decr   General Gait Details: cues for posture and position from RW;  Physical assist for balance and RW management.  Stairs            Wheelchair Mobility    Modified Rankin (Stroke  Patients Only)       Balance Overall balance assessment: Needs assistance Sitting-balance support: Feet supported;No upper extremity supported Sitting balance-Leahy Scale: Fair     Standing balance support: Bilateral upper extremity supported Standing balance-Leahy Scale: Poor                               Pertinent Vitals/Pain Pain Assessment: Faces Faces Pain Scale: Hurts little more Pain Location: abdomen with attempts to mobilize Pain Descriptors / Indicators: Grimacing;Sore Pain Intervention(s): Limited activity within patient's tolerance;Monitored during session    Home Living Family/patient expects to be discharged to:: Unsure                 Additional Comments: Pt has been staying in hotel room    Prior Function Level of Independence: Independent with assistive device(s)         Comments: Pt reports using RW for ambuation but states had not walked for 3 days prior to admit     Hand Dominance        Extremity/Trunk Assessment   Upper Extremity Assessment Upper Extremity Assessment: Defer to OT evaluation    Lower Extremity Assessment Lower Extremity Assessment: Generalized weakness (noted edema bil feet - R>L)    Cervical / Trunk Assessment Cervical / Trunk Assessment: Kyphotic  Communication   Communication: Expressive difficulties (pt mumbles)  Cognition Arousal/Alertness: Awake/alert Behavior During Therapy: WFL for tasks assessed/performed Overall Cognitive Status: Within Functional Limits for tasks assessed  General Comments      Exercises     Assessment/Plan    PT Assessment Patient needs continued PT services  PT Problem List Decreased strength;Decreased activity tolerance;Decreased balance;Decreased mobility;Decreased knowledge of use of DME;Pain;Decreased safety awareness       PT Treatment Interventions DME instruction;Gait training;Stair  training;Functional mobility training;Therapeutic activities;Therapeutic exercise;Balance training;Patient/family education    PT Goals (Current goals can be found in the Care Plan section)  Acute Rehab PT Goals Patient Stated Goal: Regain IND PT Goal Formulation: With patient Time For Goal Achievement: 03/19/21 Potential to Achieve Goals: Fair    Frequency Min 3X/week   Barriers to discharge Decreased caregiver support Pt had been living in hotel with intermittent assist of friend    Co-evaluation PT/OT/SLP Co-Evaluation/Treatment: Yes Reason for Co-Treatment: For patient/therapist safety;To address functional/ADL transfers PT goals addressed during session: Mobility/safety with mobility OT goals addressed during session: ADL's and self-care       AM-PAC PT "6 Clicks" Mobility  Outcome Measure Help needed turning from your back to your side while in a flat bed without using bedrails?: A Little Help needed moving from lying on your back to sitting on the side of a flat bed without using bedrails?: A Little Help needed moving to and from a bed to a chair (including a wheelchair)?: A Lot Help needed standing up from a chair using your arms (e.g., wheelchair or bedside chair)?: A Lot Help needed to walk in hospital room?: A Lot Help needed climbing 3-5 steps with a railing? : A Lot 6 Click Score: 14    End of Session Equipment Utilized During Treatment: Gait belt;Oxygen Activity Tolerance: Patient limited by fatigue Patient left: in chair;with call bell/phone within reach;with chair alarm set Nurse Communication: Mobility status PT Visit Diagnosis: Muscle weakness (generalized) (M62.81);Difficulty in walking, not elsewhere classified (R26.2)    Time: 1137-1202 PT Time Calculation (min) (ACUTE ONLY): 25 min   Charges:   PT Evaluation $PT Eval Low Complexity: 1 Low          Mauro Kaufmann PT Acute Rehabilitation Services Pager [phone] Office  [phone]   Roy Ryan 03/05/2021, 1:37 PM

## 2021-03-05 NOTE — Progress Notes (Signed)
PROGRESS NOTE    Roy Ryan  OMB:[id number] DOB: Nov 28, 1950 DOA: 02/22/2021 PCP: Patient, No Pcp Per (Inactive)    Brief Narrative:  70 year old male with medical history of alcohol abuse, alcohol withdrawal seizures, atrial flutter, pulmonary hypertension presented to ED via EMS for evaluation of altered mental status.  Patient currently residing at hotel and was found to be hypoglycemic with CBG 37.  EMS reported poor living conditions, feces and urine bed, rotting food, multiple alcohol bottles.  Blood pressure was soft was given IV fluid bolus.  In the ED he was hypothermic with rectal temperature 95.2.  He was placed on [name]  Initially placed on empiric antibiotics for severe sepsis however no clear source of infection identified.  Due to persistent hypotension PCCM was consulted, found to have very low cortisol 2.1, cosyntropin ordered for adrenalinsufficiency.  He received IV fluid boluses, noted on pressor support.  Patient also found to be in A. fib with RVR.  Cardiology consulted.  Chest x-ray also showed fractures at C5-6-7. Patient remains in the hospital in very poor condition.  He is maintained on dextrose infusion for hypoglycemia and intermittently on BiPAP. 9/1, some improvement in mentation and oxygenation.  Will start working with physical therapy.  Assessment & Plan:   Principal Problem:   Hypotension Active Problems:   Atrial fibrillation with rapid ventricular response (HCC)   Hypomagnesemia   Failure to thrive in adult   Hypoglycemia   Hypothermia   Protein-calorie malnutrition, severe   Nonrheumatic mitral valve regurgitation   Palliative care by specialist   Goals of care, counseling/discussion   General weakness  Toxic metabolic encephalopathy: Multifactorial.  Mental status improving.  Mostly improved to baseline today. EEG with moderate diffuse encephalopathy, no seizures or epilepsy.  CT head unremarkable. B12 and ammonia levels normal. Remains on  vitamin supplements.  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  No infectious source was identified.  Started on empiric antibiotics and was discontinued. Blood pressure responded to steroid replacement.   Blood pressures and blood sugars are stable.  Will change to oral steroids today. Patient currently on Solu-Cortef 75 mg twice a day, will change to oral Cortef 20 mg twice a day.  Multiple compression fractures cervical and thoracic spine: Symptomatic treatment.  Start with therapies today.  New onset A. fib with RVR: Seen by cardiology.  Started on Eliquis.  Initial plan for cardioversion.  Currently on amiodarone. Remains rate controlled A. fib.  Alcohol abuse with withdrawals: Currently out of withdrawal window.  On vitamin supplements.  Subclinical hypothyroidism: TSH 7.1.  T4 1.2.  Started on thyroxine.  Hypomagnesemia/hypokalemia: Replaced aggressively.  Improved.  Homelessness: Will need placement.  Start working with PT OT today.  Mitral regurgitation with prolapse: Severe MR.  Poor surgical candidate.  Palliative following.  Hypoglycemia: Due to poor oral intake.  Off dextrose infusion for the last 24 hours.  Encourage oral carb and calorie intake.  On regular diet.  Goal of care: Patient remains with severe medical morbidities and poor survival.  He is intermittently on high flow oxygen and on BiPAP at times.  He has multiple medical issues that indicate poor prognosis. Still maintained on IV dextrose to keep up with blood sugars. No family members or next of kin was able to be reached. Patient is DNR/DNI. He still hopes to get back to his hotel. Since he is somehow stabilizing today, will work on mobilizing with PT OT and anticipate referral to  SNF if he can do some rehab.    DVT prophylaxis: heparin injection 5,000 Units Start: 02/25/21 1400   Code Status: DNR/DNI Family Communication: None.   Patient has no family members to call. Disposition Plan: Status is: Inpatient  Remains inpatient appropriate because:Inpatient level of care appropriate due to severity of illness  Dispo: The patient is from: Home              Anticipated d/c is to: SNF              Patient currently is not medically stable to d/c.   Difficult to place patient No         Consultants:  PCCM  Procedures:  None  Antimicrobials:  None   Subjective: Patient seen and examined.  He is mostly maintained on 10 to 15 L of oxygen since last 24 hours.  Looks comfortable.  He had good appetite and ate most of his dinner last night.  At rest, denies any shortness of breath or pain.  Objective: Vitals:   03/05/21 0910 03/05/21 0921 03/05/21 0930 03/05/21 1130  BP: 91/62 99/69 (!) 135/96   Pulse: 89  87   Resp: (!) 22 (!) 21 (!) 23   Temp:    97.9 F (36.6 C)  TempSrc:    Oral  SpO2: 100%  100%   Weight:      Height:        Intake/Output Summary (Last 24 hours) at 03/05/2021 1306 Last data filed at 03/05/2021 1100 Gross per 24 hour  Intake 466.19 ml  Output 1975 ml  Net -1508.81 ml   Filed Weights   02/22/21 2308 03/01/21 1536  Weight: 53 kg 77.5 kg    Examination: General: Chronically sick looking.  Frail and debilitated.  Not in any distress.  Patient was on 15 L of oxygen when I examined him.  He was saturating 100%. Cardiovascular: S1-S2 normal.  Patient does have pansystolic murmur at the apex.  No other added sounds. Respiratory: Mostly clear. Gastrointestinal: Soft and nontender.  Bowel sounds present. Ext: Patient has 2+ edema mostly on the dorsum of the feet and ankle. Neuro: Alert and awake.  Mostly oriented.  Can move all extremities. Musculoskeletal: No joint deformities.      Data Reviewed: I have personally reviewed following labs and imaging studies  CBC: Recent Labs  Lab 03/01/21 0405 03/02/21 0243 03/03/21 0240 03/04/21 0302 03/05/21 0251  WBC 6.7 8.0 5.0 6.2  6.5  NEUTROABS  --   --   --  4.5 4.7  HGB 8.7* 9.5* 9.1* 8.7* 8.7*  HCT 25.6* 29.6* 27.6* 25.4* 25.8*  MCV 85.6 91.4 88.7 85.5 86.9  PLT 122* 123* 107* 120* 118*   Basic Metabolic Panel: Recent Labs  Lab 02/28/21 0553 03/01/21 0405 03/03/21 0240 03/04/21 0302 03/05/21 0251  NA 134* 135 134* 134* 135  K 3.8 3.7 3.2* 3.5 4.7  CL 110 110 103 103 103  CO2 18* 18* 22 22 26   GLUCOSE 94 107* 126* 139* 106*  BUN 11 13 10 11 13   CREATININE 0.63 0.86 0.80 0.77 0.80  CALCIUM 8.2* 8.3* 8.1* 8.0* 7.9*  MG  --   --  1.6* 1.9 2.0  PHOS  --   --   --  1.6* 1.8*   GFR: Estimated Creatinine Clearance: 98.2 mL/min (by C-G formula based on SCr of 0.8 mg/dL). Liver Function Tests: Recent Labs  Lab 02/27/21 02/28/21 1610 03/01/21 0405 03/03/21 0240 03/05/21 0251  AST 45* 44* 43* 44* 91*  ALT 15 16 16 18  36  ALKPHOS 62 64 65 64 77  BILITOT 1.7* 1.4* 1.8* 1.8* 0.9  PROT 5.4* 6.2* 6.2* 5.6* 6.1*  ALBUMIN 2.5* 2.8* 2.8* 2.4* 2.5*   No results for input(s): LIPASE, AMYLASE in the last 168 hours. No results for input(s): AMMONIA in the last 168 hours. Coagulation Profile: No results for input(s): INR, PROTIME in the last 168 hours. Cardiac Enzymes: No results for input(s): CKTOTAL, CKMB, CKMBINDEX, TROPONINI in the last 168 hours. BNP (last 3 results) No results for input(s): PROBNP in the last 8760 hours. HbA1C: No results for input(s): HGBA1C in the last 72 hours.  CBG: Recent Labs  Lab 03/04/21 1935 03/04/21 2308 03/05/21 0401 03/05/21 0738 03/05/21 1122  GLUCAP 90 120* 109* 102* 105*   Lipid Profile: No results for input(s): CHOL, HDL, LDLCALC, TRIG, CHOLHDL, LDLDIRECT in the last 72 hours. Thyroid Function Tests: No results for input(s): TSH, T4TOTAL, FREET4, T3FREE, THYROIDAB in the last 72 hours. Anemia Panel: No results for input(s): VITAMINB12, FOLATE, FERRITIN, TIBC, IRON, RETICCTPCT in the last 72 hours. Sepsis Labs: No results for input(s): PROCALCITON,  LATICACIDVEN in the last 168 hours.  Recent Results (from the past 240 hour(s))  MRSA Next Gen by PCR, Nasal     Status: None   Collection Time: 03/01/21  4:00 PM   Specimen: Nasal Mucosa; Nasal Swab  Result Value Ref Range Status   MRSA by PCR Next Gen NOT DETECTED NOT DETECTED Final    Comment: (NOTE) The GeneXpert MRSA Assay (FDA approved for NASAL specimens only), is one component of a comprehensive MRSA colonization surveillance program. It is not intended to diagnose MRSA infection nor to guide or monitor treatment for MRSA infections. Test performance is not FDA approved in patients less than 46 years old. Performed at [facility], [address], [place], [place]          Radiology Studies: No results found.      Scheduled Meds:  (feeding supplement) PROSource Plus  30 mL Oral BID BM   amiodarone  200 mg Oral BID   chlorhexidine  15 mL Mouth Rinse BID   Chlorhexidine Gluconate Cloth  6 each Topical Daily   feeding supplement  237 mL Oral BID BM   feeding supplement  237 mL Oral Q24H   folic acid  1 mg Oral Daily   furosemide  40 mg Intravenous Daily   heparin injection (subcutaneous)  5,000 Units Subcutaneous Q8H   hydrocortisone sod succinate (SOLU-CORTEF) inj  75 mg Intravenous Q12H   mouth rinse  15 mL Mouth Rinse q12n4p   multivitamin with minerals  1 tablet Oral Daily   pantoprazole  40 mg Oral Daily   pneumococcal 23 valent vaccine  0.5 mL Intramuscular Tomorrow-1000   sodium chloride flush  10-40 mL Intracatheter Q12H   thiamine  100 mg Oral Daily   Or   thiamine  100 mg Intravenous Daily   Continuous Infusions:  sodium chloride Stopped (03/03/21 1208)     LOS: 11 days    Time spent: 30 minutes    03/05/21, MD [facility] Pager [phone]

## 2021-03-05 NOTE — Evaluation (Signed)
Occupational Therapy Evaluation Patient Details Name: Roy Ryan MRN: [id number] DOB: 25-Oct-1950 Today's Date: 03/05/2021    History of Present Illness Pt admitted from hotel with hypoglycemia, hypotermia, hypotension, A-fib with RVR, and toxic metabolic encephalopathy.  Pt with hx of ETOH abuse, seizure, pumonary hypotension, atrial flutter and multiple spinal compression fx.   Clinical Impression   Mr. Quanta Roher is a 70 year old man who presents with  generalized wekaness, decreased activity tolerance and impaired balacne resulting in a decline in functional mobility and independence in ADLs. Patient min assist to stand but only briefly. Patient only able to transfer to recliner. Patient did exhibit ability to assist with donning socks supine in bed but fatigued very quickly with just moving his legs. Patient will benefit from skilled OT services while in hospital to improve deficits and learn compensatory strategies as needed in order to return to PLOF.  Patient will need short term rehab at discharge.    Follow Up Recommendations  SNF    Equipment Recommendations  None recommended by OT    Recommendations for Other Services       Precautions / Restrictions Precautions Precautions: Fall Restrictions Weight Bearing Restrictions: No      Mobility Bed Mobility Overal bed mobility: Needs Assistance Bed Mobility: Supine to Sit     Supine to sit: HOB elevated;Min assist     General bed mobility comments: Increased time    Transfers Overall transfer level: Needs assistance Equipment used: Rolling walker (2 wheeled) Transfers: Sit to/from Stand Sit to Stand: Min assist;+2 physical assistance;+2 safety/equipment;From elevated surface         General transfer comment: Physical assist to bring wt up and fwd and to balance in standing with RW    Balance Overall balance assessment: Needs assistance Sitting-balance support: Feet supported;No upper extremity  supported Sitting balance-Leahy Scale: Fair     Standing balance support: Bilateral upper extremity supported Standing balance-Leahy Scale: Poor Standing balance comment: reliant on walker                           ADL either performed or assessed with clinical judgement   ADL Overall ADL's : Needs assistance/impaired Eating/Feeding: Set up;Sitting   Grooming: Set up;Sitting   Upper Body Bathing: Minimal assistance;Sitting;Set up   Lower Body Bathing: Maximal assistance;Sit to/from stand;Set up   Upper Body Dressing : Minimal assistance;Sitting   Lower Body Dressing: Maximal assistance;Sit to/from stand   Toilet Transfer: Minimal assistance;+2 for safety/equipment;BSC;RW   Toileting- Clothing Manipulation and Hygiene: Maximal assistance;Sit to/from stand;+2 for safety/equipment       Functional mobility during ADLs: +2 for safety/equipment;Minimal assistance       Vision   Vision Assessment?: No apparent visual deficits     Perception     Praxis      Pertinent Vitals/Pain Pain Assessment: Faces Faces Pain Scale: Hurts little more Pain Location: abdomen with attempts to mobilize Pain Descriptors / Indicators: Grimacing;Sore Pain Intervention(s): Monitored during session     Hand Dominance     Extremity/Trunk Assessment Upper Extremity Assessment Upper Extremity Assessment: Defer to OT evaluation   Lower Extremity Assessment Lower Extremity Assessment: Generalized weakness (noted edema bil feet - R>L)   Cervical / Trunk Assessment Cervical / Trunk Assessment: Kyphotic   Communication Communication Communication: Expressive difficulties (pt mumbles)   Cognition Arousal/Alertness: Awake/alert Behavior During Therapy: WFL for tasks assessed/performed Overall Cognitive Status: Within Functional Limits for tasks assessed  General Comments       Exercises     Shoulder Instructions       Home Living Family/patient expects to be discharged to:: Unsure                                 Additional Comments: Pt has been staying in hotel room      Prior Functioning/Environment Level of Independence: Independent with assistive device(s)        Comments: Pt reports using RW for ambuation but states had not walked for 3 days prior to admit        OT Problem List: Decreased strength;Decreased activity tolerance;Impaired balance (sitting and/or standing);Decreased knowledge of use of DME or AE;Pain;Increased edema;Decreased safety awareness      OT Treatment/Interventions: Self-care/ADL training;Therapeutic exercise;DME and/or AE instruction;Therapeutic activities;Balance training;Patient/family education    OT Goals(Current goals can be found in the care plan section) Acute Rehab OT Goals Patient Stated Goal: Regain IND OT Goal Formulation: With patient Time For Goal Achievement: 03/19/21 Potential to Achieve Goals: Good  OT Frequency: Min 2X/week   Barriers to D/C:            Co-evaluation PT/OT/SLP Co-Evaluation/Treatment: Yes Reason for Co-Treatment: For patient/therapist safety;To address functional/ADL transfers PT goals addressed during session: Mobility/safety with mobility OT goals addressed during session: ADL's and self-care      AM-PAC OT "6 Clicks" Daily Activity     Outcome Measure Help from another person eating meals?: A Little Help from another person taking care of personal grooming?: A Little Help from another person toileting, which includes using toliet, bedpan, or urinal?: A Lot Help from another person bathing (including washing, rinsing, drying)?: A Lot Help from another person to put on and taking off regular upper body clothing?: A Little Help from another person to put on and taking off regular lower body clothing?: A Lot 6 Click Score: 12   End of Session Equipment Utilized During Treatment: Rolling walker;Gait  belt Nurse Communication: Mobility status  Activity Tolerance: Patient limited by fatigue Patient left: in chair;with call bell/phone within reach;with chair alarm set  OT Visit Diagnosis: Muscle weakness (generalized) (M62.81)                Time: 6314-9702 OT Time Calculation (min): 24 min Charges:  OT General Charges $OT Visit: 1 Visit OT Evaluation $OT Eval Moderate Complexity: 1 Mod  Avianna Moynahan, OTR/L Acute Care Rehab Services  Office [phone] Pager: [phone]   Kelli Churn 03/05/2021, 3:12 PM

## 2021-03-06 LAB — COMPREHENSIVE METABOLIC PANEL
ALT: 47 U/L — ABNORMAL HIGH (ref 0–44)
AST: 83 U/L — ABNORMAL HIGH (ref 15–41)
Albumin: 2.7 g/dL — ABNORMAL LOW (ref 3.5–5.0)
Alkaline Phosphatase: 77 U/L (ref 38–126)
Anion gap: 9 (ref 5–15)
BUN: 15 mg/dL (ref 8–23)
CO2: 25 mmol/L (ref 22–32)
Calcium: 8 mg/dL — ABNORMAL LOW (ref 8.9–10.3)
Chloride: 101 mmol/L (ref 98–111)
Creatinine, Ser: 0.78 mg/dL (ref 0.61–1.24)
GFR, Estimated: >60 mL/min (ref >60)
Glucose, Bld: 123 mg/dL — ABNORMAL HIGH (ref 70–99)
Potassium: 4.1 mmol/L (ref 3.5–5.1)
Sodium: 135 mmol/L (ref 135–145)
Total Bilirubin: 1 mg/dL (ref 0.3–1.2)
Total Protein: 6.3 g/dL — ABNORMAL LOW (ref 6.5–8.1)

## 2021-03-06 LAB — GLUCOSE, CAPILLARY
Glucose-Capillary: 119 mg/dL — ABNORMAL HIGH (ref 70–99)
Glucose-Capillary: 101 mg/dL — ABNORMAL HIGH (ref 70–99)
Glucose-Capillary: 122 mg/dL — ABNORMAL HIGH (ref 70–99)
Glucose-Capillary: 97 mg/dL (ref 70–99)
Glucose-Capillary: 166 mg/dL — ABNORMAL HIGH (ref 70–99)
Glucose-Capillary: 119 mg/dL — ABNORMAL HIGH (ref 70–99)

## 2021-03-06 MED ORDER — POTASSIUM & SODIUM PHOSPHATES 280-160-250 MG PO PACK
1.0000 | PACK | Freq: Three times a day (TID) | ORAL | Status: DC
  Administered 2021-03-06 – 2021-03-09 (×13): 1 via ORAL
  Filled 2021-03-06 (×17): qty 1

## 2021-03-06 MED ORDER — MAGNESIUM OXIDE -MG SUPPLEMENT 400 (240 MG) MG PO TABS
400.0000 mg | ORAL_TABLET | Freq: Every day | ORAL | Status: DC
  Administered 2021-03-06 – 2021-03-14 (×9): 400 mg via ORAL
  Filled 2021-03-06 (×9): qty 1

## 2021-03-06 MED ORDER — FUROSEMIDE 40 MG PO TABS
40.0000 mg | ORAL_TABLET | Freq: Two times a day (BID) | ORAL | Status: DC
  Administered 2021-03-06 – 2021-03-12 (×13): 40 mg via ORAL
  Filled 2021-03-06 (×13): qty 1

## 2021-03-06 NOTE — Progress Notes (Signed)
Palliative Progress Note  HPI: 70 y.o. male  with past medical history of alcohol abuse, alcohol withdrawal, atrial flutter, pulmonary hypertension admitted on 02/22/2021 after being found down hotel with hypoglycemia and living in poor conditions.  He was hypothermic and placed on antibiotics for sepsis no source of infection was identified.  He was found to have adrenal insufficiency and started on treatment.  He also had A. fib with RVR and was evaluated by cardiology.  Subsequent work-up revealed severe mitral regurg with prolapse.  Determined to be a poor candidate for surgical intervention and recommendation was for palliative care evaluation.    Consultation and coordination of care:  I met with [name] at bedside this afternoon. He is awake alert resting in bed, now on supplemental oxygen via nasal cannula.  It is about 5-6 L.  He is somewhat improved from a respiratory standpoint, has a reasonable oral intake and is also trying to participate with physical therapy.  He remains open to consideration for skilled nursing facility.  He wishes to continue with current scope of care, goals are not towards comfort-focused care at this point.  He hopes for ongoing stabilization/recovery.  He denies any symptoms.  BP 116/69   Pulse 92   Temp 97.9 F (36.6 C) (Oral)   Resp 14   Ht '6\' 2"'  (1.88 m)   Wt 77.5 kg   SpO2 (!) 84%   BMI 21.94 kg/m  Awake alert resting comfortably Systolic murmur appreciated Coarse breath sounds No edema Overall does appear fatigued and with chronic illness and generalized weakness No focal deficits answers questions appropriately Palliative performance scale 40%  No family at bedside.  Recommend skilled nursing facility with palliative services following upon discharge if patient is able to be medically stabilized.  PMT to follow peripherally. 25 minutes   Loistine Chance MD Palliative Medicine Team Team Phone # [phone]    Greater than 50%  of this time  was spent counseling and coordinating care related to the above assessment and plan.

## 2021-03-06 NOTE — Plan of Care (Addendum)
Patient calm and restful overnight, no significant changes in condition. Patient did not wear BIPAP, but did not seem to experience respiratory distress. Incontinence care provided. Blood sugar maintained within normal range.

## 2021-03-06 NOTE — Progress Notes (Signed)
Pt refused bipap tonight. He is comfortable and no resp distress noted. Bipap stand by if pt needs it.

## 2021-03-06 NOTE — Progress Notes (Signed)
PROGRESS NOTE    Roy Ryan  CLE:[id number] DOB: 1951/07/04 DOA: 02/22/2021 PCP: Patient, No Pcp Per (Inactive)    Brief Narrative:  70 year old male with medical history of alcohol abuse, alcohol withdrawal seizures, atrial flutter, pulmonary hypertension presented to ED via EMS for evaluation of altered mental status.  Patient currently residing at hotel and was found to be hypoglycemic with CBG 37.  EMS reported poor living conditions, feces and urine bed, rotting food, multiple alcohol bottles.  Blood pressure was soft was given IV fluid bolus.  In the ED he was hypothermic with rectal temperature 95.2.  He was placed on [name]  Initially placed on empiric antibiotics for severe sepsis however no clear source of infection identified.  Due to persistent hypotension PCCM was consulted, found to have very low cortisol 2.1, cosyntropin ordered for adrenalinsufficiency.  He received IV fluid boluses, noted on pressor support.  Patient also found to be in A. fib with RVR.  Cardiology consulted.  Chest x-ray also showed fractures at C5-6-7. Patient remains in the hospital in very poor condition.  He is maintained on dextrose infusion for hypoglycemia and intermittently on BiPAP.   Assessment & Plan:   Principal Problem:   Hypotension Active Problems:   Atrial fibrillation with rapid ventricular response (HCC)   Hypomagnesemia   Failure to thrive in adult   Hypoglycemia   Hypothermia   Protein-calorie malnutrition, severe   Nonrheumatic mitral valve regurgitation   Palliative care by specialist   Goals of care, counseling/discussion   General weakness  Toxic metabolic encephalopathy: Multifactorial.  Mental status improving.  Mostly improved now. EEG with moderate diffuse encephalopathy, no seizures or epilepsy.  CT head unremarkable. B12 and ammonia levels normal. Remains on vitamin supplements.  Hypotension due to adrenal sufficiency: Cortisol stimulation test confirms evidence  of adrenal insufficiency.  No infectious source was identified.  Started on empiric antibiotics and was discontinued. Blood pressure responded to steroid replacement.   Blood pressures and blood sugars are stable.  Will change to oral steroids 9/2.  Multiple compression fractures cervical and thoracic spine: Symptomatic treatment.  Work with therapies.  Refer to SNF.  New onset A. fib with RVR: Seen by cardiology.  Started on Eliquis.  Initial plan for cardioversion.  Currently on amiodarone. Remains rate controlled A. fib.  Alcohol abuse with withdrawals: Currently out of withdrawal window.  On vitamin supplements.  Subclinical hypothyroidism: TSH 7.1.  T4 1.2.  Started on thyroxine.  Hypomagnesemia/hypokalemia: Replaced aggressively.  Improved.  Mitral regurgitation with prolapse, acute pulmonary edema with acute hypoxemic respiratory failure: Severe MR.  Poor surgical candidate.  Palliative following. Some clinical improvement now.  Was on IV Lasix for pulmonary edema, will change to oral Lasix today.  Hypoglycemia: Due to poor oral intake.  Off dextrose infusion for the last 48 hours.  Encourage oral carb and calorie intake.  On regular diet.  Goal of care: Patient remains with severe medical morbidities and poor survival.  He is intermittently on high flow oxygen and on BiPAP at times.  He has multiple medical issues that indicate poor prognosis. Still maintained on IV dextrose to keep up with blood sugars. No family members or next of kin was able to be reached. Patient is DNR/DNI. He still hopes to get back to his hotel. Stabilizing.  Transfer to telemetry bed.  He can be discharged to a skilled nursing facility when available.    DVT prophylaxis: heparin injection 5,000 Units Start: 02/25/21 1400   Code Status:  DNR/DNI Family Communication: None.  Patient has no family members to call. Disposition Plan: Status is: Inpatient  Remains inpatient appropriate  because:Inpatient level of care appropriate due to severity of illness  Dispo: The patient is from: Home              Anticipated d/c is to: SNF              Patient currently is medically stable to skilled level of care.   Difficult to place patient No         Consultants:  PCCM  Procedures:  None  Antimicrobials:  None   Subjective: Patient seen and examined.  Poor historian but denies any complaints.  No overnight events.  Did not wear BiPAP since yesterday morning.  Today he is on 6 L of oxygen and saturating well.  Denies any nausea vomiting.  Eating well.  Objective: Vitals:   03/06/21 0600 03/06/21 0613 03/06/21 0615 03/06/21 0700  BP: (!) 89/54 97/66  116/69  Pulse: 100 93 99 92  Resp: (!) 23 (!) 21 (!) 21 14  Temp:      TempSrc:      SpO2: 100% (!) 86% 91% (!) 84%  Weight:      Height:        Intake/Output Summary (Last 24 hours) at 03/06/2021 0803 Last data filed at 03/06/2021 0600 Gross per 24 hour  Intake 1125 ml  Output 600 ml  Net 525 ml   Filed Weights   02/22/21 2308 03/01/21 1536  Weight: 53 kg 77.5 kg    Examination: General: Chronically sick looking.  Frail and debilitated.  Not in any distress.  At bedside, he is 96% on 6 L. SpO2: (!) 84 % O2 Flow Rate (L/min): 6 L/min FiO2 (%): 60 % Cardiovascular: S1-S2 normal.  Patient does have pansystolic murmur at the apex.  No other added sounds. Respiratory: Mostly clear. Gastrointestinal: Soft and nontender.  Bowel sounds present. Ext: Patient has 2+ edema mostly on the dorsum of the feet and ankle. Neuro: Alert and awake.  Mostly oriented.  Can move all extremities. Musculoskeletal: No joint deformities.      Data Reviewed: I have personally reviewed following labs and imaging studies  CBC: Recent Labs  Lab 03/01/21 0405 03/02/21 0243 03/03/21 0240 03/04/21 0302 03/05/21 0251  WBC 6.7 8.0 5.0 6.2 6.5  NEUTROABS  --   --   --  4.5 4.7  HGB 8.7* 9.5* 9.1* 8.7* 8.7*  HCT 25.6* 29.6*  27.6* 25.4* 25.8*  MCV 85.6 91.4 88.7 85.5 86.9  PLT 122* 123* 107* 120* 118*   Basic Metabolic Panel: Recent Labs  Lab 03/01/21 0405 03/03/21 0240 03/04/21 0302 03/05/21 0251 03/06/21 0214  NA 135 134* 134* 135 135  K 3.7 3.2* 3.5 4.7 4.1  CL 110 103 103 103 101  CO2 18* 22 22 26 25   GLUCOSE 107* 126* 139* 106* 123*  BUN 13 10 11 13 15   CREATININE 0.86 0.80 0.77 0.80 0.78  CALCIUM 8.3* 8.1* 8.0* 7.9* 8.0*  MG  --  1.6* 1.9 2.0  --   PHOS  --   --  1.6* 1.8*  --    GFR: Estimated Creatinine Clearance: 98.2 mL/min (by C-G formula based on SCr of 0.78 mg/dL). Liver Function Tests: Recent Labs  Lab 02/28/21 0553 03/01/21 0405 03/03/21 0240 03/05/21 0251 03/06/21 0214  AST 44* 43* 44* 91* 83*  ALT 16 16 18  36 47*  ALKPHOS 64 65 64 77  77  BILITOT 1.4* 1.8* 1.8* 0.9 1.0  PROT 6.2* 6.2* 5.6* 6.1* 6.3*  ALBUMIN 2.8* 2.8* 2.4* 2.5* 2.7*   No results for input(s): LIPASE, AMYLASE in the last 168 hours. No results for input(s): AMMONIA in the last 168 hours. Coagulation Profile: No results for input(s): INR, PROTIME in the last 168 hours. Cardiac Enzymes: No results for input(s): CKTOTAL, CKMB, CKMBINDEX, TROPONINI in the last 168 hours. BNP (last 3 results) No results for input(s): PROBNP in the last 8760 hours. HbA1C: No results for input(s): HGBA1C in the last 72 hours.  CBG: Recent Labs  Lab 03/05/21 1528 03/05/21 1922 03/05/21 2255 03/06/21 0318 03/06/21 0756  GLUCAP 142* 159* 114* 119* 101*   Lipid Profile: No results for input(s): CHOL, HDL, LDLCALC, TRIG, CHOLHDL, LDLDIRECT in the last 72 hours. Thyroid Function Tests: No results for input(s): TSH, T4TOTAL, FREET4, T3FREE, THYROIDAB in the last 72 hours. Anemia Panel: No results for input(s): VITAMINB12, FOLATE, FERRITIN, TIBC, IRON, RETICCTPCT in the last 72 hours. Sepsis Labs: No results for input(s): PROCALCITON, LATICACIDVEN in the last 168 hours.  Recent Results (from the past 240 hour(s))   MRSA Next Gen by PCR, Nasal     Status: None   Collection Time: 03/01/21  4:00 PM   Specimen: Nasal Mucosa; Nasal Swab  Result Value Ref Range Status   MRSA by PCR Next Gen NOT DETECTED NOT DETECTED Final    Comment: (NOTE) The GeneXpert MRSA Assay (FDA approved for NASAL specimens only), is one component of a comprehensive MRSA colonization surveillance program. It is not intended to diagnose MRSA infection nor to guide or monitor treatment for MRSA infections. Test performance is not FDA approved in patients less than 67 years old. Performed at [facility], [address], [place], [place] [address]          Radiology Studies: No results found.      Scheduled Meds:  (feeding supplement) PROSource Plus  30 mL Oral BID BM   amiodarone  200 mg Oral BID   chlorhexidine  15 mL Mouth Rinse BID   Chlorhexidine Gluconate Cloth  6 each Topical Daily   feeding supplement  237 mL Oral BID BM   feeding supplement  237 mL Oral Q24H   folic acid  1 mg Oral Daily   furosemide  40 mg Oral BID   heparin injection (subcutaneous)  5,000 Units Subcutaneous Q8H   hydrocortisone  20 mg Oral BID   magnesium oxide  400 mg Oral Daily   mouth rinse  15 mL Mouth Rinse q12n4p   multivitamin with minerals  1 tablet Oral Daily   pantoprazole  40 mg Oral Daily   pneumococcal 23 valent vaccine  0.5 mL Intramuscular Tomorrow-1000   potassium & sodium phosphates  1 packet Oral TID WC & HS   sodium chloride flush  10-40 mL Intracatheter Q12H   thiamine  100 mg Oral Daily   Or   thiamine  100 mg Intravenous Daily   Continuous Infusions:  sodium chloride Stopped (03/03/21 1208)     LOS: 12 days    Time spent: 30 minutes    Dorcas Carrow, MD [facility] Pager [phone]

## 2021-03-07 LAB — GLUCOSE, CAPILLARY
Glucose-Capillary: 147 mg/dL — ABNORMAL HIGH (ref 70–99)
Glucose-Capillary: 97 mg/dL (ref 70–99)
Glucose-Capillary: 131 mg/dL — ABNORMAL HIGH (ref 70–99)
Glucose-Capillary: 218 mg/dL — ABNORMAL HIGH (ref 70–99)
Glucose-Capillary: 135 mg/dL — ABNORMAL HIGH (ref 70–99)

## 2021-03-07 NOTE — Progress Notes (Signed)
Pt has been repositioning himself in bed with nursing staff assist with turning as needed. He is also eating well. No needs at this time. PT remains stable. RN will continue to monitor.

## 2021-03-07 NOTE — Progress Notes (Signed)
Patient is refusing BIPAP for tonight

## 2021-03-07 NOTE — Progress Notes (Signed)
PROGRESS NOTE    Roy Ryan  LAG:[id number] DOB: [birth year] DOA: 02/22/2021 PCP: Patient, No Pcp Per (Inactive)    Brief Narrative:  70 year old male with medical history of alcohol abuse, alcohol withdrawal seizures, atrial flutter, pulmonary hypertension presented to ED via EMS for evaluation of altered mental status.  Patient currently residing at hotel and was found to be hypoglycemic with CBG 37.  EMS reported poor living conditions, feces and urine bed, rotting food, multiple alcohol bottles.  Blood pressure was soft was given IV fluid bolus.  In the ED he was hypothermic with rectal temperature 95.2.  He was placed on [name]  Initially placed on empiric antibiotics for severe sepsis however no clear source of infection identified.  Due to persistent hypotension PCCM was consulted, found to have very low cortisol 2.1, cosyntropin ordered for adrenalinsufficiency.  He received IV fluid boluses, noted on pressor support.  Patient also found to be in A. fib with RVR.  Cardiology consulted.  Chest x-ray also showed fractures at C5-6-7. Patient remains in the hospital in very poor condition.  He is maintained on dextrose infusion for hypoglycemia and intermittently on BiPAP.  9/4.  Mental status improved.  On oral steroids and off dextrose.  Waiting to go to SNF.   Assessment & Plan:   Principal Problem:   Hypotension Active Problems:   Atrial fibrillation with rapid ventricular response (HCC)   Hypomagnesemia   Failure to thrive in adult   Hypoglycemia   Hypothermia   Protein-calorie malnutrition, severe   Nonrheumatic mitral valve regurgitation   Palliative care by specialist   Goals of care, counseling/discussion   General weakness  Toxic metabolic encephalopathy: Multifactorial.  Mental status improving.  Mostly improved now. EEG with moderate diffuse encephalopathy, no seizures or epilepsy.  CT head unremarkable. B12 and ammonia levels normal. Remains on vitamin  supplements.  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  No infectious source was identified.  Started on empiric antibiotics and was discontinued. Blood pressure responded to steroid replacement.   Blood pressures and blood sugars are stable.  On oral steroids since 9/2.  Multiple compression fractures cervical and thoracic spine: Symptomatic treatment.  Work with therapies.  Refer to SNF.  New onset A. fib with RVR: Seen by cardiology.  Started on Eliquis.  Initial plan for cardioversion.  Currently on amiodarone. Remains rate controlled A. fib.  Alcohol abuse with withdrawals: Currently out of withdrawal window.  On vitamin supplements.  Subclinical hypothyroidism: TSH 7.1.  T4 1.2.  Started on thyroxine.  Hypomagnesemia/hypokalemia: Replaced aggressively.  Improved.  Mitral regurgitation with prolapse, acute pulmonary edema with acute hypoxemic respiratory failure: Severe MR.  Poor surgical candidate.  Palliative following. Some clinical improvement now.  Was treated with IV Lasix.  Now on oral Lasix.  Hypoglycemia: Due to poor oral intake.  Off dextrose infusion for the last 48 hours.  Encourage oral carb and calorie intake.  On regular diet.  Goal of care: Patient remains with severe medical morbidities and poor survival.  He is intermittently on high flow oxygen and on BiPAP at times.  He has multiple medical issues that indicate poor prognosis. Still maintained on IV dextrose to keep up with blood sugars. No family members or next of kin was able to be reached. Patient is DNR/DNI. He still hopes to get back to his hotel. Stabilizing.  Transfer to telemetry bed.  He can be discharged to a skilled nursing facility when available.  DVT prophylaxis: heparin injection 5,000 Units Start: 02/25/21 1400   Code Status: DNR/DNI Family Communication: None.  Patient has no family members to call. Disposition  Plan: Status is: Inpatient  Remains inpatient appropriate because:Inpatient level of care appropriate due to severity of illness  Dispo: The patient is from: Home              Anticipated d/c is to: SNF              Patient currently is medically stable to skilled level of care.   Difficult to place patient No         Consultants:  PCCM  Procedures:  None  Antimicrobials:  None   Subjective: Seen and examined.  Remains mostly on 2 to 4 L of oxygen.  Today he denies any complaints.  He was wondering what his rehab, I explained to him about going to a SNF and he agreeable. I asked him whether he is in contact with his brother, he tells me that they are not in touch and he is not sure if he is still dead or alive.  Objective: Vitals:   03/06/21 1458 03/06/21 1835 03/06/21 2300 03/07/21 0732  BP: 90/67 106/63 109/72   Pulse: 72 73 75 74  Resp:   18   Temp: 97.8 F (36.6 C) 97.6 F (36.4 C) 97.9 F (36.6 C)   TempSrc: Oral Oral Oral   SpO2: 96% 95% 100%   Weight:      Height:        Intake/Output Summary (Last 24 hours) at 03/07/2021 1259 Last data filed at 03/07/2021 1254 Gross per 24 hour  Intake 960 ml  Output 2100 ml  Net -1140 ml   Filed Weights   02/22/21 2308 03/01/21 1536  Weight: 53 kg 77.5 kg    Examination: General: Chronically sick looking.  Frail and debilitated.  Not in any distress.   SpO2: 100 % O2 Flow Rate (L/min): 6 L/min FiO2 (%): 60 % Cardiovascular: S1-S2 normal.  Patient does have pansystolic murmur at the apex.  No other added sounds. Respiratory: Mostly clear. Gastrointestinal: Soft and nontender.  Bowel sounds present. Ext: Patient has 2+ edema mostly on the dorsum of the feet and ankle. Neuro: Alert and awake.  Mostly oriented.  Can move all extremities. Musculoskeletal: No joint deformities.      Data Reviewed: I have personally reviewed following labs and imaging studies  CBC: Recent Labs  Lab 03/01/21 0405  03/02/21 0243 03/03/21 0240 03/04/21 0302 03/05/21 0251  WBC 6.7 8.0 5.0 6.2 6.5  NEUTROABS  --   --   --  4.5 4.7  HGB 8.7* 9.5* 9.1* 8.7* 8.7*  HCT 25.6* 29.6* 27.6* 25.4* 25.8*  MCV 85.6 91.4 88.7 85.5 86.9  PLT 122* 123* 107* 120* 118*   Basic Metabolic Panel: Recent Labs  Lab 03/01/21 0405 03/03/21 0240 03/04/21 0302 03/05/21 0251 03/06/21 0214  NA 135 134* 134* 135 135  K 3.7 3.2* 3.5 4.7 4.1  CL 110 103 103 103 101  CO2 18* 22 22 26 25   GLUCOSE 107* 126* 139* 106* 123*  BUN 13 10 11 13 15   CREATININE 0.86 0.80 0.77 0.80 0.78  CALCIUM 8.3* 8.1* 8.0* 7.9* 8.0*  MG  --  1.6* 1.9 2.0  --   PHOS  --   --  1.6* 1.8*  --    GFR: Estimated Creatinine Clearance: 98.2 mL/min (by C-G formula based on SCr of 0.78 mg/dL). Liver Function  Tests: Recent Labs  Lab 03/01/21 0405 03/03/21 0240 03/05/21 0251 03/06/21 0214  AST 43* 44* 91* 83*  ALT 16 18 36 47*  ALKPHOS 65 64 77 77  BILITOT 1.8* 1.8* 0.9 1.0  PROT 6.2* 5.6* 6.1* 6.3*  ALBUMIN 2.8* 2.4* 2.5* 2.7*   No results for input(s): LIPASE, AMYLASE in the last 168 hours. No results for input(s): AMMONIA in the last 168 hours. Coagulation Profile: No results for input(s): INR, PROTIME in the last 168 hours. Cardiac Enzymes: No results for input(s): CKTOTAL, CKMB, CKMBINDEX, TROPONINI in the last 168 hours. BNP (last 3 results) No results for input(s): PROBNP in the last 8760 hours. HbA1C: No results for input(s): HGBA1C in the last 72 hours.  CBG: Recent Labs  Lab 03/06/21 2106 03/06/21 2301 03/07/21 0416 03/07/21 0744 03/07/21 1229  GLUCAP 166* 119* 147* 97 131*   Lipid Profile: No results for input(s): CHOL, HDL, LDLCALC, TRIG, CHOLHDL, LDLDIRECT in the last 72 hours. Thyroid Function Tests: No results for input(s): TSH, T4TOTAL, FREET4, T3FREE, THYROIDAB in the last 72 hours. Anemia Panel: No results for input(s): VITAMINB12, FOLATE, FERRITIN, TIBC, IRON, RETICCTPCT in the last 72 hours. Sepsis  Labs: No results for input(s): PROCALCITON, LATICACIDVEN in the last 168 hours.  Recent Results (from the past 240 hour(s))  MRSA Next Gen by PCR, Nasal     Status: None   Collection Time: 03/01/21  4:00 PM   Specimen: Nasal Mucosa; Nasal Swab  Result Value Ref Range Status   MRSA by PCR Next Gen NOT DETECTED NOT DETECTED Final    Comment: (NOTE) The GeneXpert MRSA Assay (FDA approved for NASAL specimens only), is one component of a comprehensive MRSA colonization surveillance program. It is not intended to diagnose MRSA infection nor to guide or monitor treatment for MRSA infections. Test performance is not FDA approved in patients less than 53 years old. Performed at [facility], [address], [place], [place] [address]          Radiology Studies: No results found.      Scheduled Meds:  (feeding supplement) PROSource Plus  30 mL Oral BID BM   amiodarone  200 mg Oral BID   chlorhexidine  15 mL Mouth Rinse BID   feeding supplement  237 mL Oral BID BM   feeding supplement  237 mL Oral Q24H   folic acid  1 mg Oral Daily   furosemide  40 mg Oral BID   heparin injection (subcutaneous)  5,000 Units Subcutaneous Q8H   hydrocortisone  20 mg Oral BID   magnesium oxide  400 mg Oral Daily   multivitamin with minerals  1 tablet Oral Daily   pantoprazole  40 mg Oral Daily   pneumococcal 23 valent vaccine  0.5 mL Intramuscular Tomorrow-1000   potassium & sodium phosphates  1 packet Oral TID WC & HS   sodium chloride flush  10-40 mL Intracatheter Q12H   thiamine  100 mg Oral Daily   Or   thiamine  100 mg Intravenous Daily   Continuous Infusions:  sodium chloride Stopped (03/03/21 1208)     LOS: 13 days    Time spent: 30 minutes    Dorcas Carrow, MD [facility] Pager [phone]

## 2021-03-07 NOTE — Plan of Care (Signed)
  Problem: Clinical Measurements: Goal: Diagnostic test results will improve Outcome: Progressing   Problem: Clinical Measurements: Goal: Cardiovascular complication will be avoided Outcome: Progressing   Problem: Activity: Goal: Risk for activity intolerance will decrease Outcome: Progressing   Problem: Elimination: Goal: Will not experience complications related to bowel motility Outcome: Progressing   Problem: Pain Managment: Goal: General experience of comfort will improve Outcome: Progressing   Problem: Safety: Goal: Ability to remain free from injury will improve Outcome: Progressing

## 2021-03-07 NOTE — Plan of Care (Signed)

## 2021-03-08 LAB — GLUCOSE, CAPILLARY
Glucose-Capillary: 119 mg/dL — ABNORMAL HIGH (ref 70–99)
Glucose-Capillary: 114 mg/dL — ABNORMAL HIGH (ref 70–99)
Glucose-Capillary: 106 mg/dL — ABNORMAL HIGH (ref 70–99)
Glucose-Capillary: 115 mg/dL — ABNORMAL HIGH (ref 70–99)

## 2021-03-08 MED ORDER — NICOTINE 14 MG/24HR TD PT24
14.0000 mg | MEDICATED_PATCH | Freq: Every day | TRANSDERMAL | Status: DC
  Administered 2021-03-08 – 2021-03-14 (×7): 14 mg via TRANSDERMAL
  Filled 2021-03-08 (×7): qty 1

## 2021-03-08 NOTE — Care Management Important Message (Signed)
Important Message  Patient Details IM Letter placed in the Patient's room. Name: Roy Ryan MRN: [id number] Date of Birth: 1950/11/24   Medicare Important Message Given:  Yes     Caren Macadam 03/08/2021, 1:38 PM

## 2021-03-08 NOTE — Progress Notes (Signed)
Physical Therapy Treatment Patient Details Name: Roy Ryan MRN: [id number] DOB: [birth year] Today's Date: 03/08/2021    History of Present Illness Pt admitted from hotel with hypoglycemia, hypotermia, hypotension, A-fib with RVR, and toxic metabolic encephalopathy.  Pt with hx of ETOH abuse, seizure, pumonary hypotension, atrial flutter and multiple spinal compression fx.    PT Comments    Pt limited by decr motivation/participation and fatigue, progressing slowly. Agreeable to EOB and LE exercise. SpO2=97% on 3L, incr WOB with LAQs, HR 80s-110   Follow Up Recommendations  SNF     Equipment Recommendations  None recommended by PT    Recommendations for Other Services       Precautions / Restrictions Precautions Precautions: Fall Restrictions Weight Bearing Restrictions: No    Mobility  Bed Mobility Overal bed mobility: Needs Assistance Bed Mobility: Supine to Sit;Sit to Supine     Supine to sit: Min assist Sit to supine: Min guard   General bed mobility comments: increased time, HOB flat, assist to elevate trunk, min-guard to bring LEs on to bed completely: able to laterally scoot along EOB with min-guard assist, verbal cues for technique and self assist    Transfers                 General transfer comment: pt adamantly declined repeated requests for further mobility.  Ambulation/Gait                 Stairs             Wheelchair Mobility    Modified Rankin (Stroke Patients Only)       Balance   Sitting-balance support: Feet supported;No upper extremity supported Sitting balance-Leahy Scale: Fair                                      Cognition Arousal/Alertness: Awake/alert Behavior During Therapy: WFL for tasks assessed/performed Overall Cognitive Status: Within Functional Limits for tasks assessed                                        Exercises General Exercises - Lower Extremity Long Arc  Quad: AROM;Both;10 reps;Seated;Limitations Long [employer] Limitations: fatigues rapidly requiring rest break after 7 reps; manual HS stretch x 20 sec bil after LAQs completed    General Comments        Pertinent Vitals/Pain Pain Assessment: No/denies pain    Home Living                      Prior Function            PT Goals (current goals can now be found in the care plan section) Acute Rehab PT Goals Patient Stated Goal: Regain IND PT Goal Formulation: With patient Time For Goal Achievement: 03/19/21 Potential to Achieve Goals: Fair Progress towards PT goals: Progressing toward goals (slowly, decr motivation.participation)    Frequency    Min 2X/week      PT Plan Current plan remains appropriate    Co-evaluation              AM-PAC PT "6 Clicks" Mobility   Outcome Measure  Help needed turning from your back to your side while in a flat bed without using bedrails?: A Little Help needed moving from lying on your back to sitting  on the side of a flat bed without using bedrails?: A Little Help needed moving to and from a bed to a chair (including a wheelchair)?: A Lot Help needed standing up from a chair using your arms (e.g., wheelchair or bedside chair)?: A Lot Help needed to walk in hospital room?: A Lot Help needed climbing 3-5 steps with a railing? : A Lot 6 Click Score: 14    End of Session   Activity Tolerance: Patient limited by fatigue Patient left: in bed;with call bell/phone within reach;with bed alarm set Nurse Communication: Mobility status PT Visit Diagnosis: Muscle weakness (generalized) (M62.81);Difficulty in walking, not elsewhere classified (R26.2)     Time: 5573-2202 PT Time Calculation (min) (ACUTE ONLY): 13 min  Charges:  $Therapeutic Activity: 8-22 mins                     Delice Bison, PT  Acute Rehab Dept [facility]) [phone] Pager [phone]  03/08/2021    [facility] 03/08/2021, 3:19 PM

## 2021-03-08 NOTE — Progress Notes (Signed)
PROGRESS NOTE    Roy Ryan  LAG:[id number] DOB: 19-May-1951 DOA: 02/22/2021 PCP: Patient, No Pcp Per (Inactive)    Brief Narrative:  70 year old male with medical history of alcohol abuse, alcohol withdrawal seizures, atrial flutter, pulmonary hypertension presented to ED via EMS for evaluation of altered mental status.  Patient currently residing at hotel and was found to be hypoglycemic with CBG 37.  EMS reported poor living conditions, feces and urine bed, rotting food, multiple alcohol bottles.  Blood pressure was soft was given IV fluid bolus.  In the ED he was hypothermic with rectal temperature 95.2.  He was placed on [name]  Initially placed on empiric antibiotics for severe sepsis however no clear source of infection identified.  Due to persistent hypotension PCCM was consulted, found to have very low cortisol 2.1, cosyntropin ordered for adrenalinsufficiency.  He received IV fluid boluses, noted on pressor support.  Patient also found to be in A. fib with RVR.  Cardiology consulted.  Chest x-ray also showed fractures at C5-6-7. Patient remains in the hospital in very poor condition.  He is maintained on dextrose infusion for hypoglycemia and intermittently on BiPAP.  9/4.  Mental status improved.  On oral steroids and off dextrose.  Waiting to go to SNF.   Assessment & Plan:   Principal Problem:   Hypotension Active Problems:   Atrial fibrillation with rapid ventricular response (HCC)   Hypomagnesemia   Failure to thrive in adult   Hypoglycemia   Hypothermia   Protein-calorie malnutrition, severe   Nonrheumatic mitral valve regurgitation   Palliative care by specialist   Goals of care, counseling/discussion   General weakness  Toxic metabolic encephalopathy: Multifactorial.  Mental status improving.  Mostly improved now. EEG with moderate diffuse encephalopathy, no seizures or epilepsy.  CT head unremarkable. B12 and ammonia levels normal. Remains on vitamin  supplements.  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  No infectious source was identified.  Started on empiric antibiotics and was discontinued. Blood pressure responded to steroid replacement.   Blood pressures and blood sugars are stable.  On oral steroids since 9/2.  Multiple compression fractures cervical and thoracic spine: Symptomatic treatment.  Work with therapies.  Refer to SNF.  New onset A. fib with RVR: Seen by cardiology.  Started on Eliquis.  Initial plan for cardioversion.  Currently on amiodarone. Remains rate controlled A. fib.  Alcohol abuse with withdrawals: Currently out of withdrawal window.  On vitamin supplements.  Subclinical hypothyroidism: TSH 7.1.  T4 1.2.  Started on thyroxine.  Hypomagnesemia/hypokalemia: Replaced aggressively.  Improved.  Mitral regurgitation with prolapse, acute pulmonary edema with acute hypoxemic respiratory failure: Severe MR.  Poor surgical candidate.  Palliative following. Some clinical improvement now.  Was treated with IV Lasix.  Now on oral Lasix.  Hypoglycemia: Due to poor oral intake.  Off dextrose infusion for the last 48 hours.  Encourage oral carb and calorie intake.  On regular diet.  Goal of care: Patient remains with severe medical morbidities and poor survival.  He is intermittently on high flow oxygen and on BiPAP at times.  He has multiple medical issues that indicate poor prognosis. Still maintained on IV dextrose to keep up with blood sugars. No family members or next of kin was able to be reached. Patient is DNR/DNI. He still hopes to get back to his hotel. Stabilizing.  Patient is adequately stabilized.  He can be transferred to skilled nursing facility when available.  DVT prophylaxis: heparin injection 5,000 Units Start: 02/25/21 1400   Code Status: DNR/DNI Family Communication: None.  Patient has no family members to  call. Disposition Plan: Status is: Inpatient  Remains inpatient appropriate because:Inpatient level of care appropriate due to severity of illness  Dispo: The patient is from: Home              Anticipated d/c is to: SNF              Patient currently is medically stable to skilled level of care.   Difficult to place patient No         Consultants:  PCCM  Procedures:  None  Antimicrobials:  None   Subjective: Seen and examined.  No overnight events.  Wants some nicotine patch.  Last night he did not want BiPAP as he was comfortable breathing on oxygen.  Currently on 2 L oxygen.  Objective: Vitals:   03/06/21 1835 03/06/21 2300 03/07/21 0732 03/08/21 0444  BP: 106/63 109/72  107/63  Pulse: 73 75 74 97  Resp:  18    Temp: 97.6 F (36.4 C) 97.9 F (36.6 C)  98.1 F (36.7 C)  TempSrc: Oral Oral  Oral  SpO2: 95% 100%  91%  Weight:    74.4 kg  Height:        Intake/Output Summary (Last 24 hours) at 03/08/2021 1327 Last data filed at 03/08/2021 0900 Gross per 24 hour  Intake 1375 ml  Output 2000 ml  Net -625 ml   Filed Weights   02/22/21 2308 03/01/21 1536 03/08/21 0444  Weight: 53 kg 77.5 kg 74.4 kg    Examination:  General: Chronically sick looking.  Frail and debilitated but very comfortable today.  Mostly on 2 to 4 L of oxygen.  Without any distress. Cardiovascular: S1-S2 normal.  Regular rate rhythm.  He has 4/6 pansystolic murmur at mitral area. Respiratory: Bilateral clear.  No added sounds. Gastrointestinal: Soft and nontender. Ext: 2+ edema mostly on the dorsum of the feet. Neuro: Alert and oriented.  No focal neurological deficits.  Generalized weakness.     Data Reviewed: I have personally reviewed following labs and imaging studies  CBC: Recent Labs  Lab 03/02/21 0243 03/03/21 0240 03/04/21 0302 03/05/21 0251  WBC 8.0 5.0 6.2 6.5  NEUTROABS  --   --  4.5 4.7  HGB 9.5* 9.1* 8.7* 8.7*  HCT 29.6* 27.6* 25.4* 25.8*  MCV 91.4 88.7 85.5  86.9  PLT 123* 107* 120* 118*   Basic Metabolic Panel: Recent Labs  Lab 03/03/21 0240 03/04/21 0302 03/05/21 0251 03/06/21 0214  NA 134* 134* 135 135  K 3.2* 3.5 4.7 4.1  CL 103 103 103 101  CO2 22 22 26 25   GLUCOSE 126* 139* 106* 123*  BUN 10 11 13 15   CREATININE 0.80 0.77 0.80 0.78  CALCIUM 8.1* 8.0* 7.9* 8.0*  MG 1.6* 1.9 2.0  --   PHOS  --  1.6* 1.8*  --    GFR: Estimated Creatinine Clearance: 93 mL/min (by C-G formula based on SCr of 0.78 mg/dL). Liver Function Tests: Recent Labs  Lab 03/03/21 0240 03/05/21 0251 03/06/21 0214  AST 44* 91* 83*  ALT 18 36 47*  ALKPHOS 64 77 77  BILITOT 1.8* 0.9 1.0  PROT 5.6* 6.1* 6.3*  ALBUMIN 2.4* 2.5* 2.7*   No results for input(s): LIPASE, AMYLASE in the last 168 hours. No results for input(s): AMMONIA in the last 168 hours. Coagulation Profile: No results for input(s):  INR, PROTIME in the last 168 hours. Cardiac Enzymes: No results for input(s): CKTOTAL, CKMB, CKMBINDEX, TROPONINI in the last 168 hours. BNP (last 3 results) No results for input(s): PROBNP in the last 8760 hours. HbA1C: No results for input(s): HGBA1C in the last 72 hours.  CBG: Recent Labs  Lab 03/07/21 1229 03/07/21 1704 03/07/21 2009 03/08/21 0759 03/08/21 1208  GLUCAP 131* 218* 135* 119* 114*   Lipid Profile: No results for input(s): CHOL, HDL, LDLCALC, TRIG, CHOLHDL, LDLDIRECT in the last 72 hours. Thyroid Function Tests: No results for input(s): TSH, T4TOTAL, FREET4, T3FREE, THYROIDAB in the last 72 hours. Anemia Panel: No results for input(s): VITAMINB12, FOLATE, FERRITIN, TIBC, IRON, RETICCTPCT in the last 72 hours. Sepsis Labs: No results for input(s): PROCALCITON, LATICACIDVEN in the last 168 hours.  Recent Results (from the past 240 hour(s))  MRSA Next Gen by PCR, Nasal     Status: None   Collection Time: 03/01/21  4:00 PM   Specimen: Nasal Mucosa; Nasal Swab  Result Value Ref Range Status   MRSA by PCR Next Gen NOT DETECTED NOT  DETECTED Final    Comment: (NOTE) The GeneXpert MRSA Assay (FDA approved for NASAL specimens only), is one component of a comprehensive MRSA colonization surveillance program. It is not intended to diagnose MRSA infection nor to guide or monitor treatment for MRSA infections. Test performance is not FDA approved in patients less than 37 years old. Performed at [facility], [address] [place], [place] [address]          Radiology Studies: No results found.      Scheduled Meds:  (feeding supplement) PROSource Plus  30 mL Oral BID BM   amiodarone  200 mg Oral BID   chlorhexidine  15 mL Mouth Rinse BID   feeding supplement  237 mL Oral BID BM   feeding supplement  237 mL Oral Q24H   folic acid  1 mg Oral Daily   furosemide  40 mg Oral BID   heparin injection (subcutaneous)  5,000 Units Subcutaneous Q8H   hydrocortisone  20 mg Oral BID   magnesium oxide  400 mg Oral Daily   multivitamin with minerals  1 tablet Oral Daily   nicotine  14 mg Transdermal Daily   pantoprazole  40 mg Oral Daily   pneumococcal 23 valent vaccine  0.5 mL Intramuscular Tomorrow-1000   potassium & sodium phosphates  1 packet Oral TID WC & HS   sodium chloride flush  10-40 mL Intracatheter Q12H   thiamine  100 mg Oral Daily   Or   thiamine  100 mg Intravenous Daily   Continuous Infusions:  sodium chloride Stopped (03/03/21 1208)     LOS: 14 days    Time spent: 30 minutes    Dorcas Carrow, MD [facility] Pager [phone]

## 2021-03-08 NOTE — Progress Notes (Signed)
Nutrition Follow-up  DOCUMENTATION CODES:   Severe malnutrition in context of social or environmental circumstances  INTERVENTION:  - continue Ensure Surgery once/day, Ensure Plus BID, and 30 ml Prosource Plus BID.    NUTRITION DIAGNOSIS:   Severe Malnutrition related to social / environmental circumstances as evidenced by severe fat depletion, severe muscle depletion. -ongoing  GOAL:   Patient will meet greater than or equal to 90% of their needs -met   MONITOR:   PO intake, Supplement acceptance, Labs, Weight trends  ASSESSMENT:   70 y.o. male with medical history of alcohol abuse, alcohol withdrawal seizures, atypical flutter, and pulmonary HTN. He presented to the ED d/t AMS. He is residing at a motel. EMS reported very poor living conditions to include urine and feces covering the bed, rotting food, and multiple alcohol bottles. He reported drinking 3-4 beers/day and that he had no food or water in his motel for an unknown amount of time.  He is now noted to be a/o x3. He has mainly been eating 100% at all meals over the past 3 days. Per review of orders, he has been accepting all oral nutrition supplements 75-90% of the time offered.   Weight today is -7 lb compared to weight on 8/29, but noted order for lasix BID. He is currently +4.4 L.  Patient is from home and plan at this time is for SNF. He is noted to be medically stable pending a bed.     Labs reviewed; CBGs: 119 and 114 mg/dl,  Medications reviewed; 1 mg folvite/day, 40 mg oral lasix BID, 400 mg mag-ox/day, 1 tablet multivitamin with minerals/day, 40 mg oral protonix/day, 1 packet Phos-Nak TID, 100 mg oral thiamine/day.    Diet Order:   Diet Order             Diet regular Room service appropriate? Yes; Fluid consistency: Thin  Diet effective now                   EDUCATION NEEDS:   No education needs have been identified at this time  Skin:  Skin Assessment: Reviewed RN Assessment  Last BM:   03/01/21 - Type 5, smear  Height:   Ht Readings from Last 1 Encounters:  03/01/21 '6\' 2"'  (1.88 m)    Weight:   Wt Readings from Last 1 Encounters:  03/08/21 74.4 kg      Estimated Nutritional Needs:  Kcal:  2100-2300 kcal Protein:  105-120 grams Fluid:  >/= 2.5 L/day     Jarome Matin, MS, RD, LDN, CNSC Inpatient Clinical Dietitian RD pager # available in [facility]  After hours/weekend pager # available in [facility]

## 2021-03-09 LAB — GLUCOSE, CAPILLARY
Glucose-Capillary: 142 mg/dL — ABNORMAL HIGH (ref 70–99)
Glucose-Capillary: 89 mg/dL (ref 70–99)
Glucose-Capillary: 110 mg/dL — ABNORMAL HIGH (ref 70–99)
Glucose-Capillary: 148 mg/dL — ABNORMAL HIGH (ref 70–99)

## 2021-03-09 NOTE — NC FL2 (Signed)
[place] MEDICAID FL2 LEVEL OF CARE SCREENING TOOL     IDENTIFICATION  Patient Name: Roy Ryan Birthdate: [birth year] Sex: male Admission Date (Current Location): 02/22/2021  [facility] and [place] Number:  Producer, television/film/video and Address:  [facility],  [address] [place] [address]      Provider Number: [id number]  Attending Physician Name and Address:  Dorcas Carrow, MD  Relative Name and Phone Number:       Current Level of Care: Hospital Recommended Level of Care: Skilled Nursing Facility Prior Approval Number:    Date Approved/Denied:   PASRR Number: [id number]  Discharge Plan: SNF    Current Diagnoses: Patient Active Problem List   Diagnosis Date Noted   Palliative care by specialist    Goals of care, counseling/discussion    General weakness    Nonrheumatic mitral valve regurgitation    Hypotension 02/23/2021   HFrEF (heart failure with reduced ejection fraction) (HCC) 02/23/2021   Atrial fibrillation with rapid ventricular response (HCC) 02/23/2021   Hypomagnesemia 02/23/2021   Chronic alcoholic liver disease (HCC) 02/23/2021   Hypoalbuminemia 02/23/2021   Failure to thrive in adult 02/23/2021   Mild protein-calorie malnutrition (HCC) 02/23/2021   Thrombocytopenia (HCC) 02/23/2021   Hypoglycemia 02/23/2021   Hypothermia 02/23/2021   Protein-calorie malnutrition, severe 02/23/2021   Chest pain 01/31/2020    Orientation RESPIRATION BLADDER Height & Weight     Self, Time, Situation, Place  Normal Incontinent Weight: 74.4 kg Height:  6\' 2"  (188 cm)  BEHAVIORAL SYMPTOMS/MOOD NEUROLOGICAL BOWEL NUTRITION STATUS      Continent Diet (Regular)  AMBULATORY STATUS COMMUNICATION OF NEEDS Skin   Extensive Assist Verbally Normal                       Personal Care Assistance Level of Assistance  Bathing, Feeding, Dressing Bathing Assistance: Maximum assistance Feeding assistance: Independent Dressing Assistance: Maximum  assistance     Functional Limitations Info  Sight, Hearing, Speech Sight Info: Adequate Hearing Info: Adequate Speech Info: Adequate    SPECIAL CARE FACTORS FREQUENCY  PT (By licensed PT), OT (By licensed OT)     PT Frequency: Eval and Treat OT Frequency: Eval and Treat            Contractures Contractures Info: Not present    Additional Factors Info  Code Status, Allergies Code Status Info: DNR Allergies Info: No Known Allergies           Current Medications (03/09/2021):  This is the current hospital active medication list Current Facility-Administered Medications  Medication Dose Route Frequency Provider Last Rate Last Admin   (feeding supplement) PROSource Plus liquid 30 mL  30 mL Oral BID BM Croitoru, Mihai, MD   30 mL at 03/09/21 1302   0.9 %  sodium chloride infusion   Intravenous PRN 05/09/21, MD   Stopped at 03/03/21 1208   acetaminophen (TYLENOL) tablet 650 mg  650 mg Oral Q6H PRN Croitoru, Mihai, MD   650 mg at 03/01/21 03/03/21   Or   acetaminophen (TYLENOL) suppository 650 mg  650 mg Rectal Q6H PRN Croitoru, Mihai, MD       amiodarone (PACERONE) tablet 200 mg  200 mg Oral BID 3220, Traci R, MD   200 mg at 03/09/21 1015   chlorhexidine (PERIDEX) 0.12 % solution 15 mL  15 mL Mouth Rinse BID 05/09/21, MD   15 mL at 03/09/21 1016   feeding supplement (ENSURE ENLIVE / ENSURE  PLUS) liquid 237 mL  237 mL Oral BID BM Jerald Kief, MD   237 mL at 03/09/21 1400   feeding supplement (ENSURE SURGERY) liquid 237 mL  237 mL Oral Q24H Croitoru, Mihai, MD   237 mL at 03/06/21 1530   folic acid (FOLVITE) tablet 1 mg  1 mg Oral Daily Croitoru, Mihai, MD   1 mg at 03/09/21 1015   furosemide (LASIX) tablet 40 mg  40 mg Oral BID Dorcas Carrow, MD   40 mg at 03/09/21 1015   heparin injection 5,000 Units  5,000 Units Subcutaneous Q8H Hessie Knows, RPH   5,000 Units at 03/09/21 1355   hydrocortisone (CORTEF) tablet 20 mg  20 mg Oral BID Dorcas Carrow, MD   20 mg at  03/09/21 1015   magnesium oxide (MAG-OX) tablet 400 mg  400 mg Oral Daily Dorcas Carrow, MD   400 mg at 03/09/21 1014   multivitamin with minerals tablet 1 tablet  1 tablet Oral Daily Croitoru, Mihai, MD   1 tablet at 03/09/21 1015   nicotine (NICODERM CQ - dosed in mg/24 hours) patch 14 mg  14 mg Transdermal Daily Dorcas Carrow, MD   14 mg at 03/09/21 1013   pantoprazole (PROTONIX) EC tablet 40 mg  40 mg Oral Daily Croitoru, Mihai, MD   40 mg at 03/09/21 1015   pneumococcal 23 valent vaccine (PNEUMOVAX-23) injection 0.5 mL  0.5 mL Intramuscular Tomorrow-1000 [place], Sarina Ill, MD       potassium & sodium phosphates (PHOS-NAK) 280-160-250 MG packet 1 packet  1 packet Oral TID WC & HS Dorcas Carrow, MD   1 packet at 03/09/21 1302   sodium chloride flush (NS) 0.9 % injection 10-40 mL  10-40 mL Intracatheter Q12H Jerald Kief, MD   10 mL at 03/08/21 1100   sodium chloride flush (NS) 0.9 % injection 10-40 mL  10-40 mL Intracatheter PRN Jerald Kief, MD       thiamine tablet 100 mg  100 mg Oral Daily Croitoru, Mihai, MD   100 mg at 03/09/21 1015   Or   thiamine (B-1) injection 100 mg  100 mg Intravenous Daily Croitoru, Mihai, MD   100 mg at 03/03/21 9794     Discharge Medications: Please see discharge summary for a list of discharge medications.  Relevant Imaging Results:  Relevant Lab Results:   Additional Information SS#[id number]  Geni Bers, RN

## 2021-03-09 NOTE — Progress Notes (Signed)
1700- Pt assisted oob to bsc per request by this nurse and NT [place]. CBG 110mg /dl. 1L amber urine emptied from Catheter drainage bag. Back to bed without incidence. Call bell within reach. 02 @ 2L/[place].

## 2021-03-09 NOTE — TOC Progression Note (Signed)
Transition of Care [facility]) - Progression Note    Patient Details  Name: Roy Ryan MRN: [id number] Date of Birth: 24-Aug-1950  [facility] CM/SW Contact  Geni Bers, RN Phone Number: 03/09/2021, 3:58 PM  Clinical Narrative:    Spoke with pt concerning discharge plans to SNF. Pt was not sure of SNF, agreed to being faxed out. Pt was faxed to SNF.         Expected Discharge Plan and Services                                                 Social Determinants of Health (SDOH) Interventions    Readmission Risk Interventions No flowsheet data found.

## 2021-03-09 NOTE — Progress Notes (Signed)
PROGRESS NOTE    Roy Ryan  XBJ:[id number] DOB: 02-25-1951 DOA: 02/22/2021 PCP: Patient, No Pcp Per (Inactive)    Brief Narrative:  70 year old male with medical history of alcohol abuse, alcohol withdrawal seizures, atrial flutter, pulmonary hypertension presented to ED via EMS for evaluation of altered mental status.  Patient currently residing at hotel and was found to be hypoglycemic with CBG 37.  EMS reported poor living conditions, feces and urine bed, rotting food, multiple alcohol bottles.  Blood pressure was soft was given IV fluid bolus.  In the ED he was hypothermic with rectal temperature 95.2.  He was placed on [name]  Initially placed on empiric antibiotics for severe sepsis however no clear source of infection identified.  Due to persistent hypotension PCCM was consulted, found to have very low cortisol 2.1, cosyntropin ordered for adrenalinsufficiency.  He received IV fluid boluses, noted on pressor support.  Patient also found to be in A. fib with RVR.  Cardiology consulted.  Chest x-ray also showed fractures at C5-6-7. Patient remains in the hospital in very poor condition.  He is maintained on dextrose infusion for hypoglycemia and intermittently on BiPAP.  9/4.  Mental status improved.  On oral steroids and off dextrose.  Waiting to go to SNF. 9/6.  Oxygenation much improved.  Now on 2 to 4 L of oxygen.  Not using BiPAP.  Celebrated birthday in house.   Assessment & Plan:   Principal Problem:   Hypotension Active Problems:   Atrial fibrillation with rapid ventricular response (HCC)   Hypomagnesemia   Failure to thrive in adult   Hypoglycemia   Hypothermia   Protein-calorie malnutrition, severe   Nonrheumatic mitral valve regurgitation   Palliative care by specialist   Goals of care, counseling/discussion   General weakness  Toxic metabolic encephalopathy: Multifactorial.  Mental status improving.  Mostly improved now. EEG with moderate diffuse  encephalopathy, no seizures or epilepsy.  CT head unremarkable. B12 and ammonia levels normal. Remains on vitamin supplements.  Hypotension due to adrenal sufficiency: Cortisol stimulation test confirmed evidence of adrenal insufficiency.  No infectious source was identified.  Started on empiric antibiotics and was discontinued. Blood pressure responded to steroid replacement.   Blood pressures and blood sugars are stable.  On oral steroids since 9/2. May slowly decrease dose of his steroids.  Multiple compression fractures cervical and thoracic spine: Symptomatic treatment.  Work with therapies.  Refer to SNF.  New onset A. fib with RVR: Seen by cardiology.  Started on Eliquis.  Initial plan for cardioversion.  Currently on amiodarone. Remains rate controlled A. fib.  Alcohol abuse with withdrawals: Currently out of withdrawal window.  On vitamin supplements.  Subclinical hypothyroidism: TSH 7.1.  T4 1.2.  Started on thyroxine.  Hypomagnesemia/hypokalemia: Replaced aggressively.  Improved.  Mitral regurgitation with prolapse, acute pulmonary edema with acute hypoxemic respiratory failure: Severe MR.  Poor surgical candidate.  Palliative following. Some clinical improvement now.  Was treated with IV Lasix.  Now on oral Lasix. Stabilized.  Euvolemic now.  Hypoglycemia: Due to poor oral intake.  Blood sugar improved.  Not on any artificial dextrose support for the last 4 days.  Eating well now.  Goal of care: Patient remains with severe medical morbidities and poor survival.  He has multiple medical issues that indicate poor prognosis. No family members or next of kin was able to be reached. Patient is DNR/DNI. He hopes to get back to his motel after rehab.  Agreeable to SNF.  Patient is adequately  stabilized.  He can be transferred to skilled nursing facility when available.    DVT prophylaxis: heparin injection 5,000 Units Start: 02/25/21 1400   Code Status: DNR/DNI Family  Communication: None.  Patient has no family members to call. Disposition Plan: Status is: Inpatient  Remains inpatient appropriate because:Inpatient level of care appropriate due to severity of illness  Dispo: The patient is from: Home              Anticipated d/c is to: SNF              Patient currently is medically stable to skilled level of care.   Difficult to place patient No         Consultants:  PCCM  Procedures:  None  Antimicrobials:  None   Subjective: Patient seen and examined.  Denies any complaints.  He is not wanting to use BiPAP and not needed BiPAP since last 3 nights.  Mostly on 2 L of oxygen. He is more alert and conversant these days. He is alert and oriented x4, except he told me that this was his 79th birthday, apparently he is only 70 year old as per records.   Objective: Vitals:   03/08/21 1549 03/08/21 2117 03/09/21 0518 03/09/21 0800  BP: 97/62 109/66 113/68 110/73  Pulse: 85 87 89 100  Resp: 18 16 16 16   Temp: 97.9 F (36.6 C) 98.1 F (36.7 C) 98.1 F (36.7 C) 97.6 F (36.4 C)  TempSrc: Oral Oral Oral Oral  SpO2: 100% 99% 100% 98%  Weight:      Height:        Intake/Output Summary (Last 24 hours) at 03/09/2021 1306 Last data filed at 03/09/2021 0200 Gross per 24 hour  Intake 240 ml  Output --  Net 240 ml   Filed Weights   02/22/21 2308 03/01/21 1536 03/08/21 0444  Weight: 53 kg 77.5 kg 74.4 kg    Examination:  General: Chronically sick looking.  Frail and debilitated but very comfortable today.  On 2 L oxygen. Cardiovascular: S1-S2 normal.  Regular rate rhythm.  He has 4/6 pansystolic murmur at mitral area. Respiratory: Bilateral clear.  No added sounds. Gastrointestinal: Soft and nontender. Ext: 2+ edema mostly on the dorsum of the feet. Neuro: Alert and oriented.  No focal neurological deficits.  Generalized weakness.     Data Reviewed: I have personally reviewed following labs and imaging studies  CBC: Recent Labs   Lab 03/03/21 0240 03/04/21 0302 03/05/21 0251  WBC 5.0 6.2 6.5  NEUTROABS  --  4.5 4.7  HGB 9.1* 8.7* 8.7*  HCT 27.6* 25.4* 25.8*  MCV 88.7 85.5 86.9  PLT 107* 120* 118*   Basic Metabolic Panel: Recent Labs  Lab 03/03/21 0240 03/04/21 0302 03/05/21 0251 03/06/21 0214  NA 134* 134* 135 135  K 3.2* 3.5 4.7 4.1  CL 103 103 103 101  CO2 22 22 26 25   GLUCOSE 126* 139* 106* 123*  BUN 10 11 13 15   CREATININE 0.80 0.77 0.80 0.78  CALCIUM 8.1* 8.0* 7.9* 8.0*  MG 1.6* 1.9 2.0  --   PHOS  --  1.6* 1.8*  --    GFR: Estimated Creatinine Clearance: 93 mL/min (by C-G formula based on SCr of 0.78 mg/dL). Liver Function Tests: Recent Labs  Lab 03/03/21 0240 03/05/21 0251 03/06/21 0214  AST 44* 91* 83*  ALT 18 36 47*  ALKPHOS 64 77 77  BILITOT 1.8* 0.9 1.0  PROT 5.6* 6.1* 6.3*  ALBUMIN 2.4* 2.5* 2.7*  No results for input(s): LIPASE, AMYLASE in the last 168 hours. No results for input(s): AMMONIA in the last 168 hours. Coagulation Profile: No results for input(s): INR, PROTIME in the last 168 hours. Cardiac Enzymes: No results for input(s): CKTOTAL, CKMB, CKMBINDEX, TROPONINI in the last 168 hours. BNP (last 3 results) No results for input(s): PROBNP in the last 8760 hours. HbA1C: No results for input(s): HGBA1C in the last 72 hours.  CBG: Recent Labs  Lab 03/08/21 1208 03/08/21 1621 03/08/21 2120 03/09/21 0722 03/09/21 1131  GLUCAP 114* 106* 115* 142* 89   Lipid Profile: No results for input(s): CHOL, HDL, LDLCALC, TRIG, CHOLHDL, LDLDIRECT in the last 72 hours. Thyroid Function Tests: No results for input(s): TSH, T4TOTAL, FREET4, T3FREE, THYROIDAB in the last 72 hours. Anemia Panel: No results for input(s): VITAMINB12, FOLATE, FERRITIN, TIBC, IRON, RETICCTPCT in the last 72 hours. Sepsis Labs: No results for input(s): PROCALCITON, LATICACIDVEN in the last 168 hours.  Recent Results (from the past 240 hour(s))  MRSA Next Gen by PCR, Nasal     Status: None    Collection Time: 03/01/21  4:00 PM   Specimen: Nasal Mucosa; Nasal Swab  Result Value Ref Range Status   MRSA by PCR Next Gen NOT DETECTED NOT DETECTED Final    Comment: (NOTE) The GeneXpert MRSA Assay (FDA approved for NASAL specimens only), is one component of a comprehensive MRSA colonization surveillance program. It is not intended to diagnose MRSA infection nor to guide or monitor treatment for MRSA infections. Test performance is not FDA approved in patients less than 31 years old. Performed at [facility], [address], [place], [place] [address]          Radiology Studies: No results found.      Scheduled Meds:  (feeding supplement) PROSource Plus  30 mL Oral BID BM   amiodarone  200 mg Oral BID   chlorhexidine  15 mL Mouth Rinse BID   feeding supplement  237 mL Oral BID BM   feeding supplement  237 mL Oral Q24H   folic acid  1 mg Oral Daily   furosemide  40 mg Oral BID   heparin injection (subcutaneous)  5,000 Units Subcutaneous Q8H   hydrocortisone  20 mg Oral BID   magnesium oxide  400 mg Oral Daily   multivitamin with minerals  1 tablet Oral Daily   nicotine  14 mg Transdermal Daily   pantoprazole  40 mg Oral Daily   pneumococcal 23 valent vaccine  0.5 mL Intramuscular Tomorrow-1000   potassium & sodium phosphates  1 packet Oral TID WC & HS   sodium chloride flush  10-40 mL Intracatheter Q12H   thiamine  100 mg Oral Daily   Or   thiamine  100 mg Intravenous Daily   Continuous Infusions:  sodium chloride Stopped (03/03/21 1208)     LOS: 15 days    Time spent: 30 minutes    Dorcas Carrow, MD [facility] Pager [phone]

## 2021-03-10 LAB — BASIC METABOLIC PANEL
Anion gap: 9 (ref 5–15)
BUN: 15 mg/dL (ref 8–23)
CO2: 28 mmol/L (ref 22–32)
Calcium: 7.9 mg/dL — ABNORMAL LOW (ref 8.9–10.3)
Chloride: 98 mmol/L (ref 98–111)
Creatinine, Ser: 0.81 mg/dL (ref 0.61–1.24)
GFR, Estimated: >60 mL/min (ref >60)
Glucose, Bld: 105 mg/dL — ABNORMAL HIGH (ref 70–99)
Potassium: 2.8 mmol/L — ABNORMAL LOW (ref 3.5–5.1)
Sodium: 135 mmol/L (ref 135–145)

## 2021-03-10 LAB — CBC WITH DIFFERENTIAL/PLATELET
Abs Immature Granulocytes: 0.03 10*3/uL (ref 0.00–0.07)
Basophils Absolute: 0 10*3/uL (ref 0.0–0.1)
Basophils Relative: 0 %
Eosinophils Absolute: 0.1 10*3/uL (ref 0.0–0.5)
Eosinophils Relative: 1 %
HCT: 25.3 % — ABNORMAL LOW (ref 39.0–52.0)
Hemoglobin: 8.5 g/dL — ABNORMAL LOW (ref 13.0–17.0)
Immature Granulocytes: 0 %
Lymphocytes Relative: 17 %
Lymphs Abs: 1.3 10*3/uL (ref 0.7–4.0)
MCH: 30 pg (ref 26.0–34.0)
MCHC: 33.6 g/dL (ref 30.0–36.0)
MCV: 89.4 fL (ref 80.0–100.0)
Monocytes Absolute: 0.8 10*3/uL (ref 0.1–1.0)
Monocytes Relative: 10 %
Neutro Abs: 5.5 10*3/uL (ref 1.7–7.7)
Neutrophils Relative %: 72 %
Platelets: 177 10*3/uL (ref 150–400)
RBC: 2.83 MIL/uL — ABNORMAL LOW (ref 4.22–5.81)
RDW: 23.1 % — ABNORMAL HIGH (ref 11.5–15.5)
WBC: 7.8 10*3/uL (ref 4.0–10.5)
nRBC: 0 % (ref 0.0–0.2)

## 2021-03-10 LAB — GLUCOSE, CAPILLARY
Glucose-Capillary: 135 mg/dL — ABNORMAL HIGH (ref 70–99)
Glucose-Capillary: 100 mg/dL — ABNORMAL HIGH (ref 70–99)
Glucose-Capillary: 137 mg/dL — ABNORMAL HIGH (ref 70–99)

## 2021-03-10 LAB — URIC ACID: Uric Acid, Serum: 2.6 mg/dL — ABNORMAL LOW (ref 3.7–8.6)

## 2021-03-10 LAB — MAGNESIUM: Magnesium: 1.8 mg/dL (ref 1.7–2.4)

## 2021-03-10 LAB — PHOSPHORUS: Phosphorus: 2.9 mg/dL (ref 2.5–4.6)

## 2021-03-10 MED ORDER — POTASSIUM CHLORIDE CRYS ER 20 MEQ PO TBCR
40.0000 meq | EXTENDED_RELEASE_TABLET | ORAL | Status: AC
  Administered 2021-03-10 (×3): 40 meq via ORAL
  Filled 2021-03-10 (×3): qty 2

## 2021-03-10 MED ORDER — HYDROCORTISONE 10 MG PO TABS
10.0000 mg | ORAL_TABLET | Freq: Two times a day (BID) | ORAL | Status: DC
  Administered 2021-03-10 – 2021-03-14 (×8): 10 mg via ORAL
  Filled 2021-03-10 (×8): qty 1

## 2021-03-10 NOTE — Progress Notes (Signed)
OT Cancellation Note  Patient Details Name: Roy Ryan MRN: [id number] DOB: 22-Jun-1951   Cancelled Treatment:    Reason Eval/Treat Not Completed: Patient declined, no reason specified patient reported having pain in right toe. Patient declined to participate stating he would do it tomorrow. OT will continue to follow  [facility] Sharyn Blitz OTR/L, MS Acute Rehabilitation Department Office# [phone] Pager# [phone]  03/10/2021, 3:50 PM

## 2021-03-10 NOTE — Progress Notes (Addendum)
PT Cancellation Note  Patient Details Name: Roy Ryan MRN: [id number] DOB: Jun 05, 1951   Cancelled Treatment:    Reason Eval/Treat Not Completed: Pain limiting ability to participate (pt c/o 9/10 foot pain. Ankle noted to be swollen.) Pt refused ambulation and bed level exercises. Will follow.    Ralene Bathe Kistler PT 03/10/2021  Acute Rehabilitation Services Pager [phone] Office [phone]

## 2021-03-10 NOTE — Progress Notes (Signed)
PROGRESS NOTE    Roy Ryan  EQA:[id number] DOB: [birth year] DOA: 02/22/2021 PCP: Patient, No Pcp Per (Inactive)     Brief Narrative:  Roy Ryan is a 70 year old male with medical history of alcohol abuse, alcohol withdrawal seizures, atrial flutter, pulmonary hypertension presented to ED via EMS for evaluation of altered mental status.  Patient currently residing at hotel and was found to be hypoglycemic with CBG 37.  EMS reported poor living conditions, feces and urine bed, rotting food, multiple alcohol bottles.  Blood pressure was soft was given IV fluid bolus.  In the ED he was hypothermic with rectal temperature 95.2.  He was placed on [name]  Initially placed on empiric antibiotics for severe sepsis however no clear source of infection identified.  Sepsis ruled out.  Due to persistent hypotension PCCM was consulted, found to have very low cortisol 2.1, cosyntropin ordered for adrenal insufficiency.  He received IV fluid boluses, noted on pressor support.  Patient also found to be in A. fib with RVR.  Cardiology consulted.  Chest x-ray also showed fractures at C5-6-7. He was maintained on dextrose infusion for hypoglycemia and intermittently on BiPAP.   9/4.  Mental status improved.  On oral steroids and off dextrose.  Waiting to go to SNF. 9/6.  Oxygenation much improved.  Now on 2 to 4 L of oxygen.  Not using BiPAP.  Celebrated birthday in house.  New events last 24 hours / Subjective: Patient admits to having left-sided chest pain with movement.  No chest pain at rest.  It is sharp in nature and worsens with movement, deep breaths and cough.  Assessment & Plan:   Principal Problem:   Hypotension Active Problems:   Atrial fibrillation with rapid ventricular response (HCC)   Hypomagnesemia   Failure to thrive in adult   Hypoglycemia   Hypothermia   Protein-calorie malnutrition, severe   Nonrheumatic mitral valve regurgitation   Palliative care by specialist   Goals of  care, counseling/discussion   General weakness   Toxic metabolic encephalopathy: -Multifactorial. EEG with moderate diffuse encephalopathy, no seizures or epilepsy.  CT head unremarkable. B12 and ammonia levels normal. Remains on vitamin supplements.  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  No infectious source was identified.  Sepsis was ruled out.  Started on empiric antibiotics and was discontinued. Blood pressure responded to steroid replacement.   -Continue to taper steroids   Multiple compression fractures cervical and thoracic spine: -Pain management, PT OT recommending SNF placement   New onset A. fib with RVR: -Seen by cardiology.  Started on Eliquis.  Currently on amiodarone. Remains rate controlled A. fib.   Alcohol abuse with withdrawals:  -Currently out of withdrawal window.  On vitamin supplements.   Subclinical hypothyroidism: -TSH 7.1.  T4 1.2.  Started on thyroxine.    Mitral regurgitation with prolapse, acute pulmonary edema with acute hypoxemic respiratory failure:  -Severe MR.  Poor surgical candidate.  Palliative following. -Continue oral Lasix.   Hypoglycemia:  -Due to poor oral intake.  Blood sugar improved.   Goal of care: -Patient remains with severe medical morbidities and poor survival.  He has multiple medical issues that indicate poor prognosis. No family members or next of kin was able to be reached. Patient is DNR/DNI. He hopes to get back to his motel after rehab.  Agreeable to SNF.  Hypokalemia: -Replace, trend  DVT prophylaxis:  heparin injection 5,000 Units Start: 02/25/21 1400  Code  Status:     Code Status Orders  (From admission, onward)           Start     Ordered   03/01/21 1836  Do not attempt resuscitation (DNR)  Continuous       Question Answer Comment  In the event of cardiac or respiratory ARREST Do not call a "code blue"   In the event  of cardiac or respiratory ARREST Do not perform Intubation, CPR, defibrillation or ACLS   In the event of cardiac or respiratory ARREST Use medication by any route, position, wound care, and other measures to relive pain and suffering. May use oxygen, suction and manual treatment of airway obstruction as needed for comfort.      03/01/21 1835           Code Status History     Date Active Date Inactive Code Status Order ID Comments User Context   02/22/2021 2352 03/01/2021 1835 Full Code [id number]  John Giovanni, MD Inpatient   01/31/2020 1522 02/01/2020 1929 Full Code [id number]  Jae Dire, MD Inpatient      Family Communication: No family at bedside Disposition Plan:  Status is: Inpatient  Remains inpatient appropriate because:Unsafe d/c plan  Dispo: The patient is from:  [facility]              Anticipated d/c is to: SNF              Patient currently is medically stable to d/c.   Difficult to place patient No     Antimicrobials:  Anti-infectives (From admission, onward)    Start     Dose/Rate Route Frequency Ordered Stop   02/24/21 1800  amoxicillin-clavulanate (AUGMENTIN) 875-125 MG per tablet 1 tablet        1 tablet Oral Every 12 hours 02/24/21 0949 03/01/21 2149   02/24/21 0000  vancomycin (VANCOCIN) IVPB 1000 mg/200 mL premix  Status:  Discontinued        1,000 mg 200 mL/hr over 60 Minutes Intravenous Every 24 hours 02/23/21 0022 02/24/21 0949   02/23/21 0115  piperacillin-tazobactam (ZOSYN) IVPB 3.375 g  Status:  Discontinued        3.375 g 12.5 mL/hr over 240 Minutes Intravenous Every 8 hours 02/23/21 0015 02/24/21 0949   02/23/21 0115  vancomycin (VANCOCIN) IVPB 1000 mg/200 mL premix        1,000 mg 200 mL/hr over 60 Minutes Intravenous  Once 02/23/21 0015 02/23/21 0151        Objective: Vitals:   03/09/21 1411 03/09/21 1945 03/10/21 0603 03/10/21 1115  BP: 95/64 106/65 108/61 106/64  Pulse: 84 89 (!) 44 88  Resp: 18 19 18    Temp: (!) 97.5 F (36.4  C) 98.1 F (36.7 C) 97.6 F (36.4 C)   TempSrc:      SpO2: 97% 98% 100%   Weight:      Height:        Intake/Output Summary (Last 24 hours) at 03/10/2021 1243 Last data filed at 03/09/2021 2215 Gross per 24 hour  Intake --  Output 2200 ml  Net -2200 ml   Filed Weights   02/22/21 2308 03/01/21 1536 03/08/21 0444  Weight: 53 kg 77.5 kg 74.4 kg    Examination:  General exam: Appears calm and comfortable  Respiratory system: Clear to auscultation. Respiratory effort normal. No respiratory distress. No conversational dyspnea.  On room air Cardiovascular system: S1 & S2 heard, irregular rhythm. No murmurs. No pedal edema. Gastrointestinal system:  Abdomen is nondistended, soft and nontender. Normal bowel sounds heard. Central nervous system: Alert and oriented. No focal neurological deficits. Speech clear.  Extremities: Symmetric in appearance  Skin: No rashes, lesions or ulcers on exposed skin  Psychiatry: Judgement and insight appear normal. Mood & affect appropriate.   Data Reviewed: I have personally reviewed following labs and imaging studies  CBC: Recent Labs  Lab 03/04/21 0302 03/05/21 0251 03/10/21 0352  WBC 6.2 6.5 7.8  NEUTROABS 4.5 4.7 5.5  HGB 8.7* 8.7* 8.5*  HCT 25.4* 25.8* 25.3*  MCV 85.5 86.9 89.4  PLT 120* 118* 177   Basic Metabolic Panel: Recent Labs  Lab 03/04/21 0302 03/05/21 0251 03/06/21 0214 03/10/21 0352  NA 134* 135 135 135  K 3.5 4.7 4.1 2.8*  CL 103 103 101 98  CO2 22 26 25 28   GLUCOSE 139* 106* 123* 105*  BUN 11 13 15 15   CREATININE 0.77 0.80 0.78 0.81  CALCIUM 8.0* 7.9* 8.0* 7.9*  MG 1.9 2.0  --  1.8  PHOS 1.6* 1.8*  --  2.9   GFR: Estimated Creatinine Clearance: 91.9 mL/min (by C-G formula based on SCr of 0.81 mg/dL). Liver Function Tests: Recent Labs  Lab 03/05/21 0251 03/06/21 0214  AST 91* 83*  ALT 36 47*  ALKPHOS 77 77  BILITOT 0.9 1.0  PROT 6.1* 6.3*  ALBUMIN 2.5* 2.7*   No results for input(s): LIPASE, AMYLASE in  the last 168 hours. No results for input(s): AMMONIA in the last 168 hours. Coagulation Profile: No results for input(s): INR, PROTIME in the last 168 hours. Cardiac Enzymes: No results for input(s): CKTOTAL, CKMB, CKMBINDEX, TROPONINI in the last 168 hours. BNP (last 3 results) No results for input(s): PROBNP in the last 8760 hours. HbA1C: No results for input(s): HGBA1C in the last 72 hours. CBG: Recent Labs  Lab 03/09/21 1131 03/09/21 1658 03/09/21 1946 03/10/21 0731 03/10/21 1227  GLUCAP 89 110* 148* 135* 100*   Lipid Profile: No results for input(s): CHOL, HDL, LDLCALC, TRIG, CHOLHDL, LDLDIRECT in the last 72 hours. Thyroid Function Tests: No results for input(s): TSH, T4TOTAL, FREET4, T3FREE, THYROIDAB in the last 72 hours. Anemia Panel: No results for input(s): VITAMINB12, FOLATE, FERRITIN, TIBC, IRON, RETICCTPCT in the last 72 hours. Sepsis Labs: No results for input(s): PROCALCITON, LATICACIDVEN in the last 168 hours.  Recent Results (from the past 240 hour(s))  MRSA Next Gen by PCR, Nasal     Status: None   Collection Time: 03/01/21  4:00 PM   Specimen: Nasal Mucosa; Nasal Swab  Result Value Ref Range Status   MRSA by PCR Next Gen NOT DETECTED NOT DETECTED Final    Comment: (NOTE) The GeneXpert MRSA Assay (FDA approved for NASAL specimens only), is one component of a comprehensive MRSA colonization surveillance program. It is not intended to diagnose MRSA infection nor to guide or monitor treatment for MRSA infections. Test performance is not FDA approved in patients less than 68 years old. Performed at [facility], [address] [place], [place] [address]       Radiology Studies: No results found.    Scheduled Meds:  (feeding supplement) PROSource Plus  30 mL Oral BID BM   amiodarone  200 mg Oral BID   chlorhexidine  15 mL Mouth Rinse BID   feeding supplement  237 mL Oral BID BM   feeding supplement  237 mL Oral Q24H   folic  acid  1 mg Oral Daily   furosemide  40 mg Oral BID   heparin injection (subcutaneous)  5,000 Units Subcutaneous Q8H   hydrocortisone  20 mg Oral BID   magnesium oxide  400 mg Oral Daily   multivitamin with minerals  1 tablet Oral Daily   nicotine  14 mg Transdermal Daily   pantoprazole  40 mg Oral Daily   pneumococcal 23 valent vaccine  0.5 mL Intramuscular Tomorrow-1000   potassium chloride  40 mEq Oral Q4H   sodium chloride flush  10-40 mL Intracatheter Q12H   thiamine  100 mg Oral Daily   Or   thiamine  100 mg Intravenous Daily   Continuous Infusions:  sodium chloride Stopped (03/03/21 1208)     LOS: 16 days      Time spent: 25 minutes   Noralee Stain, DO [facility] 03/10/2021, 12:43 PM   Available via Epic secure chat 7am-7pm After these hours, please refer to coverage provider listed on [email or web address]

## 2021-03-10 NOTE — Progress Notes (Signed)
  Amiodarone Drug - Drug Interaction Consult Note  Recommendations:  Moderate interaction with apixaban but no intervention required; monitor for bleeding Continue to monitor and replace K/Mg while on Furosemide  Amiodarone is metabolized by the cytochrome P450 system and therefore has the potential to cause many drug interactions. Amiodarone has an average plasma half-life of 50 days (range 20 to 100 days).   There is potential for drug interactions to occur several weeks or months after stopping treatment and the onset of drug interactions may be slow after initiating amiodarone.   []  Statins: Increased risk of myopathy. Simvastatin- restrict dose to 20mg  daily. Other statins: counsel patients to report any muscle pain or weakness immediately.  [x]  Anticoagulants: Amiodarone can increase anticoagulant effect. Consider warfarin dose reduction. Patients should be monitored closely and the dose of anticoagulant altered accordingly, remembering that amiodarone levels take several weeks to stabilize.  []  Antiepileptics: Amiodarone can increase plasma concentration of phenytoin, the dose should be reduced. Note that small changes in phenytoin dose can result in large changes in levels. Monitor patient and counsel on signs of toxicity.  []  Beta blockers: increased risk of bradycardia, AV block and myocardial depression. Sotalol - avoid concomitant use.  []   Calcium channel blockers (diltiazem and verapamil): increased risk of bradycardia, AV block and myocardial depression.  []   Cyclosporine: Amiodarone increases levels of cyclosporine. Reduced dose of cyclosporine is recommended.  []  Digoxin dose should be halved when amiodarone is started.  [x]  Diuretics: increased risk of cardiotoxicity if hypokalemia occurs.  []  Oral hypoglycemic agents (glyburide, glipizide, glimepiride): increased risk of hypoglycemia. Patient's glucose levels should be monitored closely when initiating amiodarone  therapy.   []  Drugs that prolong the QT interval:  Torsades de pointes risk may be increased with concurrent use - avoid if possible.  Monitor QTc, also keep magnesium/potassium WNL if concurrent therapy can't be avoided.  Antibiotics: e.g. fluoroquinolones, erythromycin.  Antiarrhythmics: e.g. quinidine, procainamide, disopyramide, sotalol.  Antipsychotics: e.g. phenothiazines, haloperidol.   Lithium, tricyclic antidepressants, and methadone. Thank You,  Breeanna Galgano A  03/10/2021 10:59 AM

## 2021-03-11 ENCOUNTER — Other Ambulatory Visit (HOSPITAL_COMMUNITY): Payer: Self-pay

## 2021-03-11 ENCOUNTER — Ambulatory Visit: Payer: Medicare Other | Admitting: Student

## 2021-03-11 LAB — BASIC METABOLIC PANEL
Anion gap: 10 (ref 5–15)
BUN: 21 mg/dL (ref 8–23)
CO2: 28 mmol/L (ref 22–32)
Calcium: 8 mg/dL — ABNORMAL LOW (ref 8.9–10.3)
Chloride: 97 mmol/L — ABNORMAL LOW (ref 98–111)
Creatinine, Ser: 0.85 mg/dL (ref 0.61–1.24)
GFR, Estimated: >60 mL/min (ref >60)
Glucose, Bld: 93 mg/dL (ref 70–99)
Potassium: 3.5 mmol/L (ref 3.5–5.1)
Sodium: 135 mmol/L (ref 135–145)

## 2021-03-11 LAB — MAGNESIUM: Magnesium: 2.1 mg/dL (ref 1.7–2.4)

## 2021-03-11 LAB — GLUCOSE, CAPILLARY
Glucose-Capillary: 96 mg/dL (ref 70–99)
Glucose-Capillary: 127 mg/dL — ABNORMAL HIGH (ref 70–99)
Glucose-Capillary: 128 mg/dL — ABNORMAL HIGH (ref 70–99)
Glucose-Capillary: 109 mg/dL — ABNORMAL HIGH (ref 70–99)

## 2021-03-11 MED ORDER — THIAMINE HCL 100 MG PO TABS
100.0000 mg | ORAL_TABLET | Freq: Every day | ORAL | 2 refills | Status: AC
  Filled 2021-03-11: qty 30, 30d supply, fill #0

## 2021-03-11 MED ORDER — HYDROCORTISONE 10 MG PO TABS
10.0000 mg | ORAL_TABLET | Freq: Two times a day (BID) | ORAL | 2 refills | Status: AC
  Filled 2021-03-11: qty 60, 30d supply, fill #0

## 2021-03-11 MED ORDER — FOLIC ACID 1 MG PO TABS
1.0000 mg | ORAL_TABLET | Freq: Every day | ORAL | 2 refills | Status: DC
  Filled 2021-03-11: qty 30, 30d supply, fill #0

## 2021-03-11 MED ORDER — ADULT MULTIVITAMIN W/MINERALS CH
1.0000 | ORAL_TABLET | Freq: Every day | ORAL | 2 refills | Status: AC
  Filled 2021-03-11: qty 30, 30d supply, fill #0

## 2021-03-11 MED ORDER — PANTOPRAZOLE SODIUM 40 MG PO TBEC
40.0000 mg | DELAYED_RELEASE_TABLET | Freq: Every day | ORAL | 2 refills | Status: DC
  Filled 2021-03-11: qty 30, 30d supply, fill #0

## 2021-03-11 MED ORDER — FUROSEMIDE 40 MG PO TABS
40.0000 mg | ORAL_TABLET | Freq: Two times a day (BID) | ORAL | 2 refills | Status: DC
  Filled 2021-03-11: qty 60, 30d supply, fill #0

## 2021-03-11 MED ORDER — AMIODARONE HCL 200 MG PO TABS
200.0000 mg | ORAL_TABLET | Freq: Two times a day (BID) | ORAL | 2 refills | Status: DC
  Filled 2021-03-11: qty 60, 30d supply, fill #0

## 2021-03-11 MED ORDER — POTASSIUM CHLORIDE CRYS ER 10 MEQ PO TBCR
10.0000 meq | EXTENDED_RELEASE_TABLET | Freq: Two times a day (BID) | ORAL | 2 refills | Status: DC
  Filled 2021-03-11: qty 60, 30d supply, fill #0

## 2021-03-11 MED ORDER — POTASSIUM CHLORIDE CRYS ER 20 MEQ PO TBCR
40.0000 meq | EXTENDED_RELEASE_TABLET | Freq: Once | ORAL | Status: AC
  Administered 2021-03-11: 40 meq via ORAL
  Filled 2021-03-11: qty 2

## 2021-03-11 NOTE — Progress Notes (Signed)
Physical Therapy Treatment Patient Details Name: Roy Ryan MRN: [id number] DOB: 1950/08/06 Today's Date: 03/11/2021    History of Present Illness Pt admitted from hotel with hypoglycemia, hypotermia, hypotension, A-fib with RVR, and toxic metabolic encephalopathy.  Pt with hx of ETOH abuse, seizure, pumonary hypotension, atrial flutter and multiple spinal compression fx.    PT Comments    Pt in bed watching TV on RA sats at 94% with HR 113 present with mild WOB.  General Gait Details: pt declined due to fatigue and c/o chest pain.  Attempt OOB to amb however limited session.  General bed mobility comments: self able to rise to EOB and increased time back to bed due to c/o "my chest hurts".  General transfer comment: pt sat EOB briefly c/o dyspnea and MAX c/o fatigue.  "I was already up earlier today" Pt has been evaluated with rec for SNF.   Follow Up Recommendations  SNF     Equipment Recommendations  None recommended by PT    Recommendations for Other Services       Precautions / Restrictions Precautions Precautions: Fall Precaution Comments: monitor vitals    Mobility  Bed Mobility Overal bed mobility: Needs Assistance Bed Mobility: Supine to Sit;Sit to Supine     Supine to sit: Supervision Sit to supine: Min guard   General bed mobility comments: self able to rise to EOB and increased time back to bed due to c/o "my chest hurts"    Transfers Overall transfer level: Needs assistance Equipment used: Rolling walker (2 wheeled) Transfers: Sit to/from [employer] Transfers Sit to Stand: From elevated surface;Min guard Stand pivot transfers: Min guard       General transfer comment: pt sat EOB briefly c/o dyspnea and MAX c/o fatigue.  "I was already up earlier today"  Ambulation/Gait             General Gait Details: pt declined due to fatigue and c/o chest pain   Stairs             Wheelchair Mobility    Modified Rankin (Stroke Patients  Only)       Balance Overall balance assessment: Needs assistance Sitting-balance support: No upper extremity supported Sitting balance-Leahy Scale: Fair     Standing balance support: Bilateral upper extremity supported Standing balance-Leahy Scale: Poor                              Cognition Arousal/Alertness: Awake/alert Behavior During Therapy: WFL for tasks assessed/performed Overall Cognitive Status: Within Functional Limits for tasks assessed                                 General Comments: AxO x 2 self limiting      Exercises      General Comments        Pertinent Vitals/Pain Pain Assessment: Faces Faces Pain Scale: Hurts little more Pain Location: chest pain Pain Descriptors / Indicators: Grimacing;Discomfort Pain Intervention(s): Monitored during session;Repositioned    Home Living                      Prior Function            PT Goals (current goals can now be found in the care plan section) Acute Rehab PT Goals Patient Stated Goal: Regain IND Progress towards PT goals: Progressing toward goals  Frequency    Min 2X/week      PT Plan Current plan remains appropriate    Co-evaluation       OT goals addressed during session:  (functional mobility)      AM-PAC PT "6 Clicks" Mobility   Outcome Measure  Help needed turning from your back to your side while in a flat bed without using bedrails?: A Little Help needed moving from lying on your back to sitting on the side of a flat bed without using bedrails?: A Little Help needed moving to and from a bed to a chair (including a wheelchair)?: A Lot Help needed standing up from a chair using your arms (e.g., wheelchair or bedside chair)?: A Lot Help needed to walk in hospital room?: A Lot Help needed climbing 3-5 steps with a railing? : A Lot 6 Click Score: 14    End of Session Equipment Utilized During Treatment: Gait belt Activity Tolerance: Patient  limited by fatigue;Patient limited by pain Patient left: in bed;with call bell/phone within reach;with bed alarm set         Time: 1535-1545 PT Time Calculation (min) (ACUTE ONLY): 10 min  Charges:  $Therapeutic Activity: 8-22 mins                     {Bowden Boody  PTA Acute  Rehabilitation Services Pager      [phone] Office      [phone]

## 2021-03-11 NOTE — TOC Progression Note (Signed)
Transition of Care [facility]) - Progression Note    Patient Details  Name: Roy Ryan MRN: [id number] Date of Birth: Nov 11, 1950  [facility] CM/SW Contact  Geni Bers, RN Phone Number: 03/11/2021, 3:38 PM  Clinical Narrative:    Pt selected [facility] is in a meeting,waiting for a return call.   Expected Discharge Plan: Skilled Nursing Facility Barriers to Discharge: No Barriers Identified  Expected Discharge Plan and Services Expected Discharge Plan: Skilled Nursing Facility       Living arrangements for the past 2 months: Hotel/Motel Expected Discharge Date: 03/11/21                                     Social Determinants of Health (SDOH) Interventions    Readmission Risk Interventions No flowsheet data found.

## 2021-03-11 NOTE — Discharge Summary (Addendum)
Physician Discharge Summary  [name] ZOX:[id number] DOB: [birth year] DOA: 02/22/2021  PCP: Patient, No Pcp Per (Inactive)  Admit date: 02/22/2021 Discharge date: 03/11/2021  Admitted From: Hotel Disposition:  SNF   Recommendations for Outpatient Follow-up:  Follow up with PCP in 1 week Follow up with cardiology Dr. Allyson Sabal in 1 to 2 weeks   Discharge Condition: Stable CODE STATUS: DNR  Diet recommendation: Heart healthy   Brief/Interim Summary: Roy Ryan is a 70 year old male with medical history of alcohol abuse, alcohol withdrawal seizures, atrial flutter, pulmonary hypertension presented to ED via EMS for evaluation of altered mental status.  Patient currently residing at hotel and was found to be hypoglycemic with CBG 37.  EMS reported poor living conditions, feces and urine bed, rotting food, multiple alcohol bottles.  Blood pressure was soft was given IV fluid bolus.  In the ED he was hypothermic with rectal temperature 95.2.  He was placed on [name]  Initially placed on empiric antibiotics for severe sepsis however no clear source of infection identified.  Sepsis ruled out.  Due to persistent hypotension PCCM was consulted, found to have very low cortisol 2.1, cosyntropin ordered for adrenal insufficiency.  He received IV fluid boluses, noted on pressor support.  Patient also found to be in A. fib with RVR.  Cardiology consulted.  Chest x-ray also showed fractures at C5-6-7. He was maintained on dextrose infusion for hypoglycemia and intermittently on BiPAP.  Patient's mentation as well as oxygenation continued to improve during hospital stay.  PT OT has recommended SNF placement.  Discharge Diagnoses:  Principal Problem:   Hypotension Active Problems:   Atrial fibrillation with rapid ventricular response (HCC)   Hypomagnesemia   Failure to thrive in adult   Hypoglycemia   Hypothermia   Protein-calorie malnutrition, severe   Nonrheumatic mitral valve regurgitation    Palliative care by specialist   Goals of care, counseling/discussion   General weakness   Toxic metabolic encephalopathy: -Multifactorial. EEG with moderate diffuse encephalopathy, no seizures or epilepsy.  CT head unremarkable. B12 and ammonia levels normal. Remains on vitamin supplements.  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  No infectious source was identified.  Sepsis was ruled out.  Started on empiric antibiotics and was discontinued. Blood pressure responded to steroid replacement.     Multiple compression fractures cervical and thoracic spine: -Pain management, PT OT recommending SNF placement   New onset A. fib with RVR: -Seen by cardiology.  Deemed to be poor candidate for anticoag. Currently on amiodarone. Remains rate controlled A. fib. -Follow-up with cardiology as outpatient   Alcohol abuse with withdrawals:  -Currently out of withdrawal window.  On vitamin supplements.   Subclinical hypothyroidism: -TSH 7.1.  T4 1.2.  Started on thyroxine.    Mitral regurgitation with prolapse, acute pulmonary edema with acute hypoxemic respiratory failure:  -Severe MR. Status post TEE 8/25.  Deemed to be poor surgical candidate.  Palliative following. -Continue oral Lasix. -Follow-up with cardiology as outpatient   Hypoglycemia:  -Due to poor oral intake.  Blood sugar improved.   Goal of care: -Patient remains with severe medical morbidities and poor survival.  He has multiple medical issues that indicate poor prognosis. No family members or next of kin was able to be reached. Patient is DNR/DNI. He hopes to get back to his motel after rehab.  Agreeable to SNF.    Discharge Instructions  Discharge Instructions     Diet - low  sodium heart healthy   Complete by: As directed    Increase activity slowly   Complete by: As directed       Allergies as of 03/11/2021   No Known Allergies       Medication List     TAKE these medications    amiodarone 200 MG tablet Commonly known as: PACERONE Take 1 tablet (200 mg total) by mouth 2 (two) times daily.   folic acid 1 MG tablet Commonly known as: FOLVITE Take 1 tablet (1 mg total) by mouth daily. Start taking on: March 12, 2021   furosemide 40 MG tablet Commonly known as: LASIX Take 1 tablet (40 mg total) by mouth 2 (two) times daily.   hydrocortisone 10 MG tablet Commonly known as: CORTEF Take 1 tablet (10 mg total) by mouth 2 (two) times daily.   multivitamin with minerals Tabs tablet Take 1 tablet by mouth daily. Start taking on: March 12, 2021   pantoprazole 40 MG tablet Commonly known as: PROTONIX Take 1 tablet (40 mg total) by mouth daily. Start taking on: March 12, 2021   potassium chloride 10 MEQ tablet Commonly known as: KLOR-CON Take 1 tablet (10 mEq total) by mouth 2 (two) times daily.   thiamine 100 MG tablet Take 1 tablet (100 mg total) by mouth daily. Start taking on: March 12, 2021        Follow-up Information     Corrin Parker, [place]. Schedule an appointment as soon as possible for a visit in 2 week(s).   Specialties: Physician Assistant, Cardiology Contact information: [address] [place] [address] [phone]         Runell Gess, MD Follow up.   Specialties: Cardiology, Radiology Contact information: [address] [place] [address] [phone]         [facility]. Schedule an appointment as soon as possible for a visit in 1 week(s).   Contact information: [address] [place] [address] [phone]               No Known Allergies  Consultations: PCCM Cardiology Palliative care    Procedures/Studies: DG Abd 1 View  Result Date: 03/01/2021 CLINICAL DATA:  Abdominal pain for 2 days EXAM: ABDOMEN - 1 VIEW COMPARISON:  Chest from 02/22/2021  FINDINGS: Scattered large and small bowel gas is noted. No free air is seen. Increased central vascular congestion is noted when compare with the recent exam. No bony abnormality is noted. Left hip replacement is noted. IMPRESSION: No obstructive changes are seen. Increased central vascular congestion. Electronically Signed   By: Alcide Clever M.D.   On: 03/01/2021 09:58   CT HEAD WO CONTRAST ( )  Result Date: 02/22/2021 CLINICAL DATA:  Delirium EXAM: CT HEAD WITHOUT CONTRAST TECHNIQUE: Contiguous axial images were obtained from the base of the skull through the vertex without intravenous contrast. COMPARISON:  01/31/2020 FINDINGS: Brain: No evidence of acute infarction, hemorrhage, hydrocephalus, extra-axial collection or mass lesion/mass effect. Mild low-density changes within the periventricular and subcortical white matter compatible with chronic microvascular ischemic change. Mild diffuse cerebral volume loss. Vascular: Atherosclerotic calcifications involving the large vessels of the skull base. No unexpected hyperdense vessel. Skull: Normal. Negative for fracture or focal lesion. Sinuses/Orbits: Chronic complete right maxillary sinus opacification with associated bony thickening of the maxillary sinus walls. Mucosal thickening within the bilateral ethmoid air cells and bilateral frontal sinuses. Air-fluid level in the left frontal sinus. Other: None.  IMPRESSION: 1. No acute intracranial findings. 2. Mild chronic microvascular ischemic change and cerebral volume loss. 3. Chronic right maxillary sinus disease. Air-fluid level in the left frontal sinus may represent acute on chronic sinusitis. Electronically Signed   By: Duanne Guess D.O.   On: 02/22/2021 16:02   DG CHEST PORT 1 VIEW  Result Date: 03/01/2021 CLINICAL DATA:  Shortness of breath, altered mental status EXAM: PORTABLE CHEST 1 VIEW COMPARISON:  02/22/2021 FINDINGS: Cardiomegaly. There is new, diffuse bilateral heterogeneous and  interstitial airspace opacity and small layering bilateral pleural effusions. IMPRESSION: Cardiomegaly with new, diffuse bilateral heterogeneous and interstitial airspace opacity and small layering bilateral pleural effusions, concerning for pulmonary edema. Electronically Signed   By: Lauralyn Primes M.D.   On: 03/01/2021 16:14   DG Chest Portable 1 View  Addendum Date: 02/23/2021   ADDENDUM REPORT: 02/23/2021 12:15 ADDENDUM: Voice recognition error: The last sentence of the Findings section should read "compression fractures at T5, T6 and T7. These fractures are in the thoracic spine. They are stable from the CT scan 01/01/2020. No evidence for progression. " Electronically Signed   By: Marin Roberts M.D.   On: 02/23/2021 12:15   Result Date: 02/23/2021 CLINICAL DATA:  Delirium. EXAM: PORTABLE CHEST 1 VIEW COMPARISON:  One-view scratched at two-view chest x-ray and CTA chest 01/31/2020 FINDINGS: Heart is upper limits of normal. 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 No edema or effusion is present. No focal airspace disease present. Degenerative changes are again noted in the thoracic spine. Progression fractures are again noted at C5, C6, and C7. IMPRESSION: 1. No acute cardiopulmonary disease or significant interval change. 2. Progression fractures at C5, C6, and C7. Electronically Signed: By: Marin Roberts M.D. On: 02/22/2021 14:43   EEG adult  Result Date: 02/23/2021 Charlsie Quest, MD     02/23/2021 12:51 PM Patient Name: Roy Ryan MRN: [id number] Epilepsy Attending: Charlsie Quest Referring Physician/Provider: Dr John Giovanni, Date: 02/23/2021 Duration: 21.03 mins Patient history: 70 year old male with altered mental status.  EEG to evaluate for seizures. Level of alertness: Awake AEDs during EEG study: None Technical aspects: This EEG study was done with scalp electrodes positioned according to the 10-20 International system of electrode placement. Electrical activity was acquired at a  sampling rate of  and reviewed with a high frequency filter of  and a low frequency filter of . EEG data were recorded continuously and digitally stored. Description: No clear posterior dominant rhythm was seen.  EEG showed continuous generalized 3 to 5 Hz theta-delta slowing. Hyperventilation and photic stimulation were not performed.   ABNORMALITY - Continuous slow, generalized IMPRESSION: This study is suggestive of moderate diffuse encephalopathy, nonspecific etiology. No seizures or epileptiform discharges were seen throughout the recording. Charlsie Quest   ECHOCARDIOGRAM COMPLETE  Result Date: 02/23/2021    ECHOCARDIOGRAM REPORT   Patient Name:   NORTON BIVINS Date of Exam: 02/23/2021 Medical Rec #:  [id number]    Height:       74.0 in Accession #:    [id number]   Weight:       116.8 lb Date of Birth:  1950-12-13     BSA:          1.730 m Patient Age:    69 years     BP:           91/63 mmHg Patient Gender: M            HR:  88 bpm. Exam Location:  Inpatient Procedure: 2D Echo, 3D Echo, Cardiac Doppler and Color Doppler Indications:    I48.91* Unspeicified atrial fibrillation  History:        Patient has prior history of Echocardiogram examinations, most                 recent 01/31/2020. CHF, Abnormal ECG, Mitral Valve Prolapse and                 Mitral Valve Disease, Arrythmias:Atrial Fibrillation;                 Signs/Symptoms:Hypotension and Chest Pain. ETOH.  Sonographer:    Sheralyn Boatman RDCS Referring Phys: [id number] VASUNDHRA RATHORE IMPRESSIONS  1. 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 Very eccentric, anteriorly directed jet of mitral regurgitation. P2 scallop prolapse/possible flail noted on 3D images.. The mitral valve is myxomatous. Severe mitral valve regurgitation. No evidence of mitral stenosis.  2. Left ventricular ejection fraction, by estimation, is 50%. Left ventricular ejection fraction by 3D volume is 53 %. The left ventricle has mildly decreased  function. The left ventricle has no regional wall motion abnormalities. There is mild left ventricular hypertrophy. Left ventricular diastolic parameters are indeterminate.  3. Right ventricular systolic function is moderately reduced. The right ventricular size is mildly enlarged. There is moderately elevated pulmonary artery systolic pressure. The estimated right ventricular systolic pressure is 52.2 mmHg.  4. Left atrial size was severely dilated.  5. Right atrial size was moderately dilated.  6. Tricuspid valve regurgitation is moderate.  7. The aortic valve is tricuspid. There is mild thickening of the aortic valve. Aortic valve regurgitation is mild. No aortic stenosis is present.  8. Aortic dilatation noted. There is borderline dilatation of the aortic root, measuring 39 mm.  9. The inferior vena cava is dilated in size with <50% respiratory variability, suggesting right atrial pressure of 15 mmHg. Conclusion(s)/Recommendation(s): Recommend TEE for further evaluation of mitral valve prolapse. FINDINGS  Left Ventricle: Left ventricular ejection fraction, by estimation, is 50%. Left ventricular ejection fraction by 3D volume is 53 %. The left ventricle has mildly decreased function. The left ventricle has no regional wall motion abnormalities. The left ventricular internal cavity size was normal in size. There is mild left ventricular hypertrophy. Left ventricular diastolic parameters are indeterminate. Right Ventricle: The right ventricular size is mildly enlarged. No increase in right ventricular wall thickness. Right ventricular systolic function is moderately reduced. There is moderately elevated pulmonary artery systolic pressure. The tricuspid regurgitant velocity is 3.05 m/s, and with an assumed right atrial pressure of 15 mmHg, the estimated right ventricular systolic pressure is 52.2 mmHg. Left Atrium: Left atrial size was severely dilated. Right Atrium: Right atrial size was moderately dilated.  Pericardium: There is no evidence of pericardial effusion. Mitral Valve: Bileaflet mitral valve prolapse, posterior > anterior. Very eccentric, anteriorly directed jet of mitral regurgitation. P2 scallop prolapse/possible flail noted on 3D images. The mitral valve is myxomatous. Severe mitral valve regurgitation. No evidence of mitral valve stenosis. Tricuspid Valve: The tricuspid valve is normal in structure. Tricuspid valve regurgitation is moderate . No evidence of tricuspid stenosis. Aortic Valve: The aortic valve is tricuspid. There is mild thickening of the aortic valve. Aortic valve regurgitation is mild. Aortic regurgitation PHT measures 1091 msec. No aortic stenosis is present. Pulmonic Valve: The pulmonic valve was grossly normal. Pulmonic valve regurgitation is trivial. No evidence of pulmonic stenosis. Aorta: Aortic dilatation noted. There is borderline dilatation of the aortic  root, measuring 39 mm. Venous: The inferior vena cava is dilated in size with less than 50% respiratory variability, suggesting right atrial pressure of 15 mmHg. IAS/Shunts: No atrial level shunt detected by color flow Doppler.  LEFT VENTRICLE PLAX 2D LVIDd:         4.70 cm LVIDs:         3.10 cm LV PW:         1.00 cm         3D Volume EF LV IVS:        1.10 cm         LV 3D EF:    Left LVOT diam:     1.90 cm                      ventricul LVOT Area:     2.84 cm                     ar                                             ejection                                             fraction LV Volumes (MOD)                            by 3D LV vol d, MOD    110.0 ml                   volume is A2C:                                        53 %. LV vol d, MOD    77.2 ml A4C: LV vol s, MOD    68.3 ml       3D Volume EF: A2C:                           3D EF:        53 % LV vol s, MOD    43.9 ml A4C: LV SV MOD A2C:   41.7 ml LV SV MOD A4C:   77.2 ml LV SV MOD BP:    40.3 ml RIGHT VENTRICLE            IVC RV S prime:     4.79 cm/s  IVC  diam: 2.40 cm TAPSE (M-mode): 1.0 cm LEFT ATRIUM              Index       RIGHT ATRIUM           Index LA diam:        4.80 cm  2.78 cm/m  RA Area:     24.20 cm LA Vol (A2C):   170.0 ml 98.29 ml/m RA Volume:   86.80 ml  50.18 ml/m LA Vol (A4C):   71.1 ml  41.11 ml/m LA Biplane Vol: 111.0 ml 64.18 ml/m  AORTIC VALVE AI PHT:  1091 msec  AORTA Ao Root diam: 3.90 cm Ao Asc diam:  3.00 cm MITRAL VALVE                 TRICUSPID VALVE MV Area (PHT): 5.27 cm      TR Peak grad:   37.2 mmHg MV Decel Time: 144 msec      TR Vmax:        305.00 cm/s MR Peak grad:    72.9 mmHg MR Mean grad:    43.0 mmHg   SHUNTS MR Vmax:         427.00 cm/s Systemic Diam: 1.90 cm MR Vmean:        308.0 cm/s MR PISA:         6.93 cm MR PISA Eff ROA: 62 mm MR PISA Radius:  1.05 cm MV E velocity: 143.67 cm/s Weston Brass MD Electronically signed by Weston Brass MD Signature Date/Time: 02/23/2021/5:18:37 PM    Final    ECHO TEE  Result Date: 02/25/2021    TRANSESOPHOGEAL ECHO REPORT   Patient Name:   THOAMS SIEFERT Date of Exam: 02/25/2021 Medical Rec #:  [id number]    Height:       74.0 in Accession #:    [id number]   Weight:       116.8 lb Date of Birth:  15-Jan-1951     BSA:          1.730 m Patient Age:    69 years     BP:           91/61 mmHg Patient Gender: M            HR:           110 bpm. Exam Location:  Inpatient Procedure: Transesophageal Echo, 3D Echo, Color Doppler and Cardiac Doppler Indications:     I34.1 Nonrheumatic mitral (valve) prolapse  History:         Patient has prior history of Echocardiogram examinations, most                  recent 02/23/2021. Arrythmias:Atrial Fibrillation; Risk                  Factors:ETOH Abuse.  Sonographer:     Irving Burton Senior RDCS Referring Phys:  [id number] Corrin Parker Diagnosing Phys: Thurmon Fair MD PROCEDURE: After discussion of the risks and benefits of a TEE, an informed consent was obtained from the patient. The transesophogeal probe was passed without difficulty through the  esophogus of the patient. Local oropharyngeal anesthetic was provided with Cetacaine. Sedation performed by different physician. The patient was monitored while under deep sedation. Anesthestetic sedation was provided intravenously by Anesthesiology:  of Propofol,  of Lidocaine. The patient developed no complications during the procedure. IMPRESSIONS  1. Left ventricular ejection fraction, by estimation, is 60 to 65%. The left ventricle has normal function. The left ventricle has no regional wall motion abnormalities. Left ventricular diastolic function could not be evaluated.  2. Right ventricular systolic function is mildly reduced. The right ventricular size is normal. There is moderately elevated pulmonary artery systolic pressure.  3. Left atrial size was moderately dilated. No left atrial/left atrial appendage thrombus was detected.  4. Right atrial size was moderately dilated.  5. Moderate pleural effusion in the left lateral region.  6. There is severe holosystolic prolapse of both mitral leaflets, but this is particularly severe at the level of the middle (P2) scallop and medial (P3) scallop of the posterior  leaflet. P3 may be flail. The vena contracta is 6 mm and the effective regurgitant area is 1.3 cm. The regurgitant volume is 130 ml, the regurgitant fraction is 80%. The maximum flail gap is 8 mm (at P3). MV area is 6 cm. The mitral valve is myxomatous. Severe mitral valve regurgitation. The mean mitral valve gradient is 3.5 mmHg.  7. The tricuspid valve is myxomatous. Tricuspid valve regurgitation is mild to moderate.  8. The aortic valve is tricuspid. Aortic valve regurgitation is not visualized. No aortic stenosis is present.  9. There is severe spontaneous echo contrast seen in the ascending aorta, consistent with very low forward cardiac output. FINDINGS  Left Ventricle: Left ventricular ejection fraction, by estimation, is 60 to 65%. The left ventricle has normal function. The left  ventricle has no regional wall motion abnormalities. The left ventricular internal cavity size was normal in size. There is  no left ventricular hypertrophy. Left ventricular diastolic function could not be evaluated due to atrial fibrillation. Left ventricular diastolic function could not be evaluated. Right Ventricle: The right ventricular size is normal. No increase in right ventricular wall thickness. Right ventricular systolic function is mildly reduced. There is moderately elevated pulmonary artery systolic pressure. The tricuspid regurgitant velocity is 3.06 m/s, and with an assumed right atrial pressure of 10 mmHg, the estimated right ventricular systolic pressure is 47.5 mmHg. Left Atrium: Left atrial size was moderately dilated. No left atrial/left atrial appendage thrombus was detected. Right Atrium: Right atrial size was moderately dilated. Pericardium: There is no evidence of pericardial effusion. Mitral Valve: There is severe holosystolic prolapse of both mitral leaflets, but this is particularly severe at the level of the middle (P2) scallop and medial (P3) scallop of the posterior leaflet. P3 may be flail. The vena contracta is 6 mm and the effective regurgitant area is 1.3 cm. The regurgitant volume is 130 ml, the regurgitant fraction is 80%. The maximum flail gap is 8 mm (at P3). MV area is 6 cm. The mitral valve is myxomatous. Severe mitral valve regurgitation, with anteriorly-directed  jet. The mean mitral valve gradient is 3.5 mmHg with average heart rate of 120 bpm. Tricuspid Valve: The tricuspid valve is myxomatous. Tricuspid valve regurgitation is mild to moderate. There is mild prolapse of the tricuspid. Aortic Valve: The aortic valve is tricuspid. Aortic valve regurgitation is not visualized. No aortic stenosis is present. Pulmonic Valve: The pulmonic valve was normal in structure. Pulmonic valve regurgitation is mild. Aorta: There is severe spontaneous echo contrast seen in the ascending  aorta, consistent with very low forward cardiac output. The aortic root, ascending aorta and aortic arch are all structurally normal, with no evidence of dilitation or obstruction. Venous: A pattern of systolic flow reversal, suggestive of severe mitral regurgitation is recorded from the right upper pulmonary vein. IAS/Shunts: No atrial level shunt detected by color flow Doppler. Additional Comments: There is a moderate pleural effusion in the left lateral region.  LEFT VENTRICLE PLAX 2D LVIDd:         4.60 cm LVIDs:         3.10 cm LVOT diam:     2.10 cm LV SV:         30 LV SV Index:   17 LVOT Area:     3.46 cm  LV Volumes (MOD) LV vol d, MOD A4C: 155.0 ml LV vol s, MOD A4C: 76.3 ml LV SV MOD A4C:     155.0 ml AORTIC VALVE LVOT Vmax:  77.40 cm/s LVOT Vmean:  50.000 cm/s LVOT VTI:    0.086 m MITRAL VALVE                   TRICUSPID VALVE MV Area (plan): 6.09 cm       TR Peak grad:   37.5 mmHg MV Mean grad:   3.5 mmHg       TR Vmax:        306.00 cm/s MR Peak grad:      72.6 mmHg MR Mean grad:      48.0 mmHg   SHUNTS MR Vmax:           426.00 cm/s Systemic VTI:  0.09 m MR Vmean:          327.0 cm/s  Systemic Diam: 2.10 cm MR Vena Contracta: 0.57 cm MR PISA:           17.82 cm MR PISA Eff ROA:   129 mm MR PISA Radius:    1.68 cm Mihai Croitoru MD Electronically signed by Thurmon Fair MD Signature Date/Time: 02/25/2021/11:02:17 AM    Final        Discharge Exam: Vitals:   03/11/21 1118 03/11/21 1421  BP: 99/67 107/68  Pulse: 97 89  Resp:  20  Temp:  97.8 F (36.6 C)  SpO2:  96%    General: Pt is alert, awake, not in acute distress Cardiovascular: Irreg rhythm rate 100, S1/S2 +, no edema Respiratory: CTA bilaterally, no wheezing, no rhonchi, no respiratory distress, no conversational dyspnea  Abdominal: Soft, NT, ND, bowel sounds + Extremities: no edema, no cyanosis Psych: Normal mood and affect, poor  judgement and insight     The results of significant diagnostics from this  hospitalization (including imaging, microbiology, ancillary and laboratory) are listed below for reference.     Microbiology: Recent Results (from the past 240 hour(s))  MRSA Next Gen by PCR, Nasal     Status: None   Collection Time: 03/01/21  4:00 PM   Specimen: Nasal Mucosa; Nasal Swab  Result Value Ref Range Status   MRSA by PCR Next Gen NOT DETECTED NOT DETECTED Final    Comment: (NOTE) The GeneXpert MRSA Assay (FDA approved for NASAL specimens only), is one component of a comprehensive MRSA colonization surveillance program. It is not intended to diagnose MRSA infection nor to guide or monitor treatment for MRSA infections. Test performance is not FDA approved in patients less than 81 years old. Performed at [facility], [address], [place], [place] [address]      Labs: BNP (last 3 results) No results for input(s): BNP in the last 8760 hours. Basic Metabolic Panel: Recent Labs  Lab 03/05/21 0251 03/06/21 0214 03/10/21 0352 03/11/21 0346  NA 135 135 135 135  K 4.7 4.1 2.8* 3.5  CL 103 101 98 97*  CO2 26 25 28 28   GLUCOSE 106* 123* 105* 93  BUN 13 15 15 21   CREATININE 0.80 0.78 0.81 0.85  CALCIUM 7.9* 8.0* 7.9* 8.0*  MG 2.0  --  1.8 2.1  PHOS 1.8*  --  2.9  --    Liver Function Tests: Recent Labs  Lab 03/05/21 0251 03/06/21 0214  AST 91* 83*  ALT 36 47*  ALKPHOS 77 77  BILITOT 0.9 1.0  PROT 6.1* 6.3*  ALBUMIN 2.5* 2.7*   No results for input(s): LIPASE, AMYLASE in the last 168 hours. No results for input(s): AMMONIA in the last 168 hours. CBC: Recent Labs  Lab 03/05/21 0251 03/10/21 0352  WBC 6.5 7.8  NEUTROABS 4.7 5.5  HGB 8.7* 8.5*  HCT 25.8* 25.3*  MCV 86.9 89.4  PLT 118* 177   Cardiac Enzymes: No results for input(s): CKTOTAL, CKMB, CKMBINDEX, TROPONINI in the last 168 hours. BNP: Invalid input(s): POCBNP CBG: Recent Labs  Lab 03/10/21 0731 03/10/21 1227 03/10/21 1604 03/11/21 0802 03/11/21 1153  GLUCAP  135* 100* 137* 96 127*   D-Dimer No results for input(s): DDIMER in the last 72 hours. Hgb A1c No results for input(s): HGBA1C in the last 72 hours. Lipid Profile No results for input(s): CHOL, HDL, LDLCALC, TRIG, CHOLHDL, LDLDIRECT in the last 72 hours. Thyroid function studies No results for input(s): TSH, T4TOTAL, T3FREE, THYROIDAB in the last 72 hours.  Invalid input(s): FREET3 Anemia work up No results for input(s): VITAMINB12, FOLATE, FERRITIN, TIBC, IRON, RETICCTPCT in the last 72 hours. Urinalysis    Component Value Date/Time   COLORURINE AMBER (A) 02/22/2021 2130   APPEARANCEUR CLEAR 02/22/2021 2130   LABSPEC 1.018 02/22/2021 2130   PHURINE 5.0 02/22/2021 2130   GLUCOSEU >=500 (A) 02/22/2021 2130   HGBUR NEGATIVE 02/22/2021 2130   BILIRUBINUR NEGATIVE 02/22/2021 2130   KETONESUR 80 (A) 02/22/2021 2130   PROTEINUR 30 (A) 02/22/2021 2130   NITRITE NEGATIVE 02/22/2021 2130   LEUKOCYTESUR NEGATIVE 02/22/2021 2130   Sepsis Labs Invalid input(s): PROCALCITONIN,  WBC,  LACTICIDVEN Microbiology Recent Results (from the past 240 hour(s))  MRSA Next Gen by PCR, Nasal     Status: None   Collection Time: 03/01/21  4:00 PM   Specimen: Nasal Mucosa; Nasal Swab  Result Value Ref Range Status   MRSA by PCR Next Gen NOT DETECTED NOT DETECTED Final    Comment: (NOTE) The GeneXpert MRSA Assay (FDA approved for NASAL specimens only), is one component of a comprehensive MRSA colonization surveillance program. It is not intended to diagnose MRSA infection nor to guide or monitor treatment for MRSA infections. Test performance is not FDA approved in patients less than 23 years old. Performed at [facility], [address], [place], [place] [address]      Patient was seen and examined on the day of discharge and was found to be in stable condition. Time coordinating discharge: 35 minutes including assessment and coordination of care, as well as examination of  the patient.   SIGNED:  Noralee Stain, DO [facility] 03/11/2021, 2:36 PM

## 2021-03-11 NOTE — Progress Notes (Signed)
Occupational Therapy Treatment Patient Details Name: Roy Ryan MRN: [id number] DOB: 02/14/1951 Today's Date: 03/11/2021    History of present illness Pt admitted from hotel with hypoglycemia, hypotermia, hypotension, A-fib with RVR, and toxic metabolic encephalopathy.  Pt with hx of ETOH abuse, seizure, pumonary hypotension, atrial flutter and multiple spinal compression fx.   OT comments  Patient reports chest pain and having received some tylenol. Chest pain complaints have been noted in MD progress notes. Patient required encouragement to participate and eventually agreed to get out of the bed due to sitting in urine. Patient min guard to stand and transfer to recliner. Patient performed grooming and use of urinal in chair. Patient's left and right posterior rib area noted to be edematous and RN notified.   Follow Up Recommendations  SNF    Equipment Recommendations  None recommended by OT    Recommendations for Other Services      Precautions / Restrictions Precautions Precautions: Fall       Mobility Bed Mobility Overal bed mobility: Needs Assistance Bed Mobility: Supine to Sit     Supine to sit: Supervision     General bed mobility comments: supervision to transfer to side of the bed.    Transfers Overall transfer level: Needs assistance Equipment used: Rolling walker (2 wheeled) Transfers: Sit to/from [employer] Sit to Stand: From elevated surface;Min guard Stand pivot transfers: Min guard       General transfer comment: Min guard to stand and transfer to recliner with RW    Balance Overall balance assessment: Needs assistance Sitting-balance support: No upper extremity supported Sitting balance-Leahy Scale: Fair     Standing balance support: Bilateral upper extremity supported Standing balance-Leahy Scale: Poor                             ADL either performed or assessed with clinical judgement   ADL Overall ADL's :  Needs assistance/impaired     Grooming: Set up;Sitting;Wash/dry face                       Toileting- Clothing Manipulation and Hygiene: Supervision/safety Toileting - Clothing Manipulation Details (indicate cue type and reason): to use urinal             Vision   Vision Assessment?: No apparent visual deficits   Perception     Praxis      Cognition Arousal/Alertness: Awake/alert Behavior During Therapy: WFL for tasks assessed/performed Overall Cognitive Status: Within Functional Limits for tasks assessed                                          Exercises     Shoulder Instructions       General Comments      Pertinent Vitals/ Pain       Pain Assessment: Faces Faces Pain Scale: Hurts little more Pain Location: chest pain Pain Descriptors / Indicators: Grimacing Pain Intervention(s): Limited activity within patient's tolerance;Premedicated before session  Home Living                                          Prior Functioning/Environment              Frequency  Min 2X/week  Progress Toward Goals  OT Goals(current goals can now be found in the care plan section)  Progress towards OT goals: Progressing toward goals  Acute Rehab OT Goals Patient Stated Goal: Regain IND OT Goal Formulation: With patient Time For Goal Achievement: 03/19/21 Potential to Achieve Goals: Good  Plan Discharge plan remains appropriate    Co-evaluation          OT goals addressed during session:  (functional mobility)      AM-PAC OT "6 Clicks" Daily Activity     Outcome Measure   Help from another person eating meals?: A Little Help from another person taking care of personal grooming?: A Little Help from another person toileting, which includes using toliet, bedpan, or urinal?: A Little Help from another person bathing (including washing, rinsing, drying)?: A Lot   Help from another person to put on and  taking off regular lower body clothing?: A Lot 6 Click Score: 13    End of Session Equipment Utilized During Treatment: Rolling walker  OT Visit Diagnosis: Muscle weakness (generalized) (M62.81)   Activity Tolerance Patient tolerated treatment well   Patient Left in chair;with call bell/phone within reach   Nurse Communication Mobility status        Time: 1309-1330 OT Time Calculation (min): 21 min  Charges: OT General Charges $OT Visit: 1 Visit OT Treatments $Therapeutic Activity: 8-22 mins  Jaysiah Marchetta, OTR/L Acute Care Rehab Services  Office [phone] Pager: [phone]    Kelli Churn 03/11/2021, 3:08 PM

## 2021-03-11 NOTE — Care Management Important Message (Signed)
Important Message  Patient Details IM Letter placed in Patient's room. Name: Roy Ryan MRN: [id number] Date of Birth: 09-01-1950   Medicare Important Message Given:  Yes     Caren Macadam 03/11/2021, 11:19 AM

## 2021-03-12 ENCOUNTER — Inpatient Hospital Stay (HOSPITAL_COMMUNITY): Payer: Medicare Other

## 2021-03-12 ENCOUNTER — Encounter (HOSPITAL_COMMUNITY): Payer: Self-pay | Admitting: Internal Medicine

## 2021-03-12 DIAGNOSIS — I2699 Other pulmonary embolism without acute cor pulmonale: Secondary | ICD-10-CM

## 2021-03-12 LAB — TROPONIN I (HIGH SENSITIVITY)
Troponin I (High Sensitivity): 21 ng/L — ABNORMAL HIGH
Troponin I (High Sensitivity): 21 ng/L — ABNORMAL HIGH (ref <18)

## 2021-03-12 LAB — APTT: aPTT: 27 seconds (ref 24–36)

## 2021-03-12 LAB — GLUCOSE, CAPILLARY
Glucose-Capillary: 103 mg/dL — ABNORMAL HIGH (ref 70–99)
Glucose-Capillary: 101 mg/dL — ABNORMAL HIGH (ref 70–99)
Glucose-Capillary: 157 mg/dL — ABNORMAL HIGH (ref 70–99)
Glucose-Capillary: 95 mg/dL (ref 70–99)

## 2021-03-12 LAB — PROTIME-INR
INR: 1.2 (ref 0.8–1.2)
Prothrombin Time: 15.4 seconds — ABNORMAL HIGH (ref 11.4–15.2)

## 2021-03-12 LAB — HEPARIN LEVEL (UNFRACTIONATED): Heparin Unfractionated: 0.54 IU/mL (ref 0.30–0.70)

## 2021-03-12 LAB — D-DIMER, QUANTITATIVE: D-Dimer, Quant: 3.31 ug/mL-FEU — ABNORMAL HIGH (ref 0.00–0.50)

## 2021-03-12 MED ORDER — MORPHINE SULFATE (PF) 2 MG/ML IV SOLN
1.0000 mg | Freq: Once | INTRAVENOUS | Status: AC
  Administered 2021-03-12: 1 mg via INTRAVENOUS
  Filled 2021-03-12: qty 1

## 2021-03-12 MED ORDER — HEPARIN BOLUS VIA INFUSION
2500.0000 [IU] | Freq: Once | INTRAVENOUS | Status: AC
  Administered 2021-03-12: 2500 [IU] via INTRAVENOUS
  Filled 2021-03-12: qty 2500

## 2021-03-12 MED ORDER — FUROSEMIDE 10 MG/ML IJ SOLN
40.0000 mg | Freq: Two times a day (BID) | INTRAMUSCULAR | Status: DC
  Administered 2021-03-12 – 2021-03-13 (×2): 40 mg via INTRAVENOUS
  Filled 2021-03-12 (×2): qty 4

## 2021-03-12 MED ORDER — IOHEXOL 350 MG/ML SOLN
100.0000 mL | Freq: Once | INTRAVENOUS | Status: AC | PRN
  Administered 2021-03-12: 100 mL via INTRAVENOUS

## 2021-03-12 MED ORDER — HEPARIN (PORCINE) 25000 UT/250ML-% IV SOLN
1350.0000 [IU]/h | INTRAVENOUS | Status: DC
  Administered 2021-03-12: 1350 [IU]/h via INTRAVENOUS
  Filled 2021-03-12: qty 250

## 2021-03-12 NOTE — Progress Notes (Signed)
   03/12/21 0814  Assess: MEWS Score  Temp 97.7 F (36.5 C)  BP 114/79  Pulse Rate 100  Resp (!) 26  SpO2 94 %  O2 Device Room Air  Assess: MEWS Score  MEWS Temp 0  MEWS Systolic 0  MEWS Pulse 0  MEWS RR 2  MEWS LOC 0  MEWS Score 2  MEWS Score Color Yellow  Assess: if the MEWS score is Yellow or Red  Were vital signs taken at a resting state? Yes  Focused Assessment No change from prior assessment  Does the patient meet 2 or more of the SIRS criteria? No  MEWS guidelines implemented *See Row Information* Yes  Treat  MEWS Interventions Escalated (See documentation below)  Pain Scale 0-10  Pain Score 8  Pain Type Acute pain  Pain Location Abdomen  Pain Orientation Mid  Pain Radiating Towards Chest  Pain Descriptors / Indicators Discomfort;Grimacing  Pain Frequency Constant  Pain Onset On-going  Patients Stated Pain Goal 2  Pain Intervention(s) Medication (See eMAR);MD notified (Comment)  Take Vital Signs  Increase Vital Sign Frequency  Yellow: Q 2hr X 2 then Q 4hr X 2, if remains yellow, continue Q 4hrs  Escalate  MEWS: Escalate Yellow: discuss with charge nurse/RN and consider discussing with provider and RRT  Notify: Charge Nurse/RN  Name of Charge Nurse/RN Notified Benjaman Lobe, RN  Date Charge Nurse/RN Notified 03/12/21  Time Charge Nurse/RN Notified 0820  Notify: Provider  Provider Name/Title Dr. Noralee Stain  Date Provider Notified 03/12/21  Time Provider Notified [phone]  Notification Type Page  Notification Reason Change in status (See note)  Provider response See new orders  Date of Provider Response 03/12/21  Time of Provider Response 0825  Assess: SIRS CRITERIA  SIRS Temperature  0  SIRS Pulse 1  SIRS Respirations  1  SIRS WBC 0  SIRS Score Sum  2   At 0758 this AM, pt c/o abdominal pain and requested Tylenol. Pt then c/o pain radiating to his chest, rated 7-8/10. Pt states this pain has been hurting for 2 days, Tylenol not helping. MEWS now yellow,  RR 26. EKG Done showing A-Fib RVR, HR 100-120s. MD Alvino Chapel notified, new orders followed. Morphine effective. 2L/[place] applied to patient for comfort. Troponin and D-Dimer elevated, awaiting CT Angio results now. CN also aware, following yellow MEWS protocol. Pt stable, states pain has improved. Eating lunch at this time.

## 2021-03-12 NOTE — Progress Notes (Signed)
Bilateral lower extremity venous duplex has been completed. Preliminary results can be found in CV Proc through chart review.   03/12/21 2:41 PM Olen Cordial RVT

## 2021-03-12 NOTE — Progress Notes (Signed)
ANTICOAGULATION CONSULT NOTE - follow up  Pharmacy Consult for IV heparin infusion Indication: pulmonary embolus  No Known Allergies  Patient Measurements: Height: 6\' 2"  (188 cm) Weight: 74.4 kg (164 lb 0.4 oz) IBW/kg (Calculated) : 82.2 Heparin Dosing Weight: 74.4 kg  Vital Signs: Temp: 97.5 F (36.4 C) (09/09 2115) Temp Source: Oral (09/09 2115) BP: 101/65 (09/09 2115) Pulse Rate: 99 (09/09 2115)  Labs: Recent Labs    03/10/21 0352 03/11/21 0346 03/12/21 0849 03/12/21 1057 03/12/21 1429 03/12/21 2156  HGB 8.5*  --   --   --   --   --   HCT 25.3*  --   --   --   --   --   PLT 177  --   --   --   --   --   APTT  --   --   --   --  27  --   LABPROT  --   --   --   --  15.4*  --   INR  --   --   --   --  1.2  --   HEPARINUNFRC  --   --   --   --   --  0.54  CREATININE 0.81 0.85  --   --   --   --   TROPONINIHS  --   --  21* 21*  --   --      Estimated Creatinine Clearance: 87.5 mL/min (by C-G formula based on SCr of 0.85 mg/dL).   Medical History: Past Medical History:  Diagnosis Date   Alcohol abuse    Atrial flutter (HCC)    Mitral regurgitation    Seizures (HCC)     Medications:  No anticoagulation medications prior to admission Active meds include heparin 5000 units subq q8h for DVT prophylaxis  Assessment: 70 y.o. male with medical history significant of alcohol abuse, alcohol withdrawal seizures, atypical flutter, and pulmonary hypertension found in atrial fibrillation in setting up hypoglycemia, hypotension, possible sepsis of unknown etiology (resolved) and adrenal insufficiency now with pulmonary embolism.   9/9 CT Angio Chest: Pulmonary embolus of the distal interlobar pulmonary artery and right lower lobe lobar artery and bilateral segmental/subsegmental pulmonary arteries.  Pulmonary embolus of the distal right interlobar pulmonary artery and right lower lobe has a more peripheral configuration, suggesting chronic component.  9/9 Bilateral  lower extremity venous duplex  - no evidence of DVT  1st HL 0.54 therapeutic on 1350 units/hr No bleeding per RN  Goal of Therapy:  Heparin level 0.3-0.7 units/ml Monitor platelets by anticoagulation protocol: Yes   Plan:  Continue Heparin 1350 units/hr continuous infusion Confirmatory heparin level in 6 hrs Monitor daily heparin level, CBC, s/s bleeding   11/9 RPh 03/12/2021, 11:39 PM

## 2021-03-12 NOTE — Progress Notes (Addendum)
PROGRESS NOTE    Roy Ryan  PVV:[id number] DOB: 1950/08/11 DOA: 02/22/2021 PCP: Patient, No Pcp Per (Inactive)     Brief Narrative:  Roy Ryan is a 70 year old male with medical history of alcohol abuse, alcohol withdrawal seizures, atrial flutter, pulmonary hypertension presented to ED via EMS for evaluation of altered mental status.  Patient currently residing at hotel and was found to be hypoglycemic with CBG 37.  EMS reported poor living conditions, feces and urine bed, rotting food, multiple alcohol bottles.  Blood pressure was soft was given IV fluid bolus.  In the ED he was hypothermic with rectal temperature 95.2.  He was placed on [name]  Initially placed on empiric antibiotics for severe sepsis however no clear source of infection identified.  Sepsis ruled out.  Due to persistent hypotension PCCM was consulted, found to have very low cortisol 2.1, cosyntropin ordered for adrenal insufficiency.  He received IV fluid boluses, noted on pressor support.  Patient also found to be in A. fib with RVR.  Cardiology consulted.  Chest x-ray also showed fractures at C5-6-7. He was maintained on dextrose infusion for hypoglycemia and intermittently on BiPAP.  Patient's mentation as well as oxygenation continued to improve during hospital stay.  PT OT has recommended SNF placement.  New events last 24 hours / Subjective: Patient had complaints of abdominal pain and chest pain this morning associated with some shortness of breath.  His EKG revealed A. fib RVR with rate 126.  He has had chest pain for the last several days, largely musculoskeletal and pleuritic in nature.  Assessment & Plan:   Principal Problem:   Hypotension Active Problems:   Atrial fibrillation with rapid ventricular response (HCC)   Hypomagnesemia   Failure to thrive in adult   Hypoglycemia   Hypothermia   Protein-calorie malnutrition, severe   Nonrheumatic mitral valve regurgitation   Palliative care by  specialist   Goals of care, counseling/discussion   General weakness   Toxic metabolic encephalopathy: -Multifactorial. EEG with moderate diffuse encephalopathy, no seizures or epilepsy.  CT head unremarkable. B12 and ammonia levels normal. Remains on vitamin supplements.  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  No infectious source was identified.  Sepsis was ruled out.  Started on empiric antibiotics and was discontinued. Blood pressure responded to steroid replacement.     Multiple compression fractures cervical and thoracic spine: -Pain management, PT OT recommending SNF placement   New onset A. fib with RVR: -Seen by cardiology.  Deemed to be poor candidate for anticoag. Currently on amiodarone. Remains rate controlled A. fib. -Follow-up with cardiology as outpatient   Alcohol abuse with withdrawals:  -Currently out of withdrawal window.  On vitamin supplements.   Subclinical hypothyroidism: -TSH 7.1.  T4 1.2.  Started on thyroxine.    Mitral regurgitation with prolapse, acute pulmonary edema with acute hypoxemic respiratory failure:  -Severe MR. Status post TEE 8/25.  Deemed to be poor surgical candidate.  Palliative following. -Continue oral Lasix. -Follow-up with cardiology as outpatient   Hypoglycemia:  -Due to poor oral intake.  Blood sugar improved.   Goal of care: -Patient remains with severe medical morbidities and poor survival.  He has multiple medical issues that indicate poor prognosis. No family members or next of kin was able to be reached. Patient is DNR/DNI.   PE: -Troponin only mildly elevated and flat in trend 21 --> 21 -D dimer elevated 3.31 -CTA chest positive for PE -  Start IV heparin -DVT US   DVT prophylaxis:  heparin injection 5,000 Units Start: 02/25/21 1400  Code Status:     Code Status Orders  (From admission, onward)           Start     Ordered    03/01/21 1836  Do not attempt resuscitation (DNR)  Continuous       Question Answer Comment  In the event of cardiac or respiratory ARREST Do not call a "code blue"   In the event of cardiac or respiratory ARREST Do not perform Intubation, CPR, defibrillation or ACLS   In the event of cardiac or respiratory ARREST Use medication by any route, position, wound care, and other measures to relive pain and suffering. May use oxygen, suction and manual treatment of airway obstruction as needed for comfort.      03/01/21 1835           Code Status History     Date Active Date Inactive Code Status Order ID Comments User Context   02/22/2021 2352 03/01/2021 1835 Full Code [id number]  John Giovanni, MD Inpatient   01/31/2020 1522 02/01/2020 1929 Full Code [id number]  Jae Dire, MD Inpatient      Family Communication: No family at bedside Disposition Plan:  Status is: Inpatient  Remains inpatient appropriate because:Unsafe d/c plan  Dispo: The patient is from:  [facility]              Anticipated d/c is to: SNF              Patient currently is not medically stable to d/c. CTA chest pending.    Difficult to place patient No     Antimicrobials:  Anti-infectives (From admission, onward)    Start     Dose/Rate Route Frequency Ordered Stop   02/24/21 1800  amoxicillin-clavulanate (AUGMENTIN) 875-125 MG per tablet 1 tablet        1 tablet Oral Every 12 hours 02/24/21 0949 03/01/21 2149   02/24/21 0000  vancomycin (VANCOCIN) IVPB 1000 mg/200 mL premix  Status:  Discontinued        1,000 mg 200 mL/hr over 60 Minutes Intravenous Every 24 hours 02/23/21 0022 02/24/21 0949   02/23/21 0115  piperacillin-tazobactam (ZOSYN) IVPB 3.375 g  Status:  Discontinued        3.375 g 12.5 mL/hr over 240 Minutes Intravenous Every 8 hours 02/23/21 0015 02/24/21 0949   02/23/21 0115  vancomycin (VANCOCIN) IVPB 1000 mg/200 mL premix        1,000 mg 200 mL/hr over 60 Minutes Intravenous  Once 02/23/21  0015 02/23/21 0151        Objective: Vitals:   03/12/21 0451 03/12/21 0814 03/12/21 1016 03/12/21 1202  BP: 106/75 114/79 103/69 103/71  Pulse: 84 100 (!) 106 87  Resp: 18 (!) 26 12 16   Temp: 98.9 F (37.2 C) 97.7 F (36.5 C) 98.2 F (36.8 C) (!) 97.5 F (36.4 C)  TempSrc: Oral Oral Oral Oral  SpO2: 96% 94% 99% 98%  Weight:      Height:        Intake/Output Summary (Last 24 hours) at 03/12/2021 1322 Last data filed at 03/12/2021 1008 Gross per 24 hour  Intake --  Output 1325 ml  Net -1325 ml    Filed Weights   02/22/21 2308 03/01/21 1536 03/08/21 0444  Weight: 53 kg 77.5 kg 74.4 kg    Examination: General exam: Appears calm and comfortable  Respiratory system: Clear to auscultation.  Respiratory effort normal. Cardiovascular system: S1 & S2 heard, irreg rhythm. No pedal edema. Gastrointestinal system: Abdomen is nondistended, soft and nontender. Normal bowel sounds heard. Central nervous system: Alert and oriented. Non focal exam. Speech clear  Extremities: Symmetric in appearance bilaterally  Skin: No rashes, lesions or ulcers on exposed skin  Psychiatry: Judgement and insight appear stable. Mood & affect appropriate.     Data Reviewed: I have personally reviewed following labs and imaging studies  CBC: Recent Labs  Lab 03/10/21 0352  WBC 7.8  NEUTROABS 5.5  HGB 8.5*  HCT 25.3*  MCV 89.4  PLT 177    Basic Metabolic Panel: Recent Labs  Lab 03/06/21 0214 03/10/21 0352 03/11/21 0346  NA 135 135 135  K 4.1 2.8* 3.5  CL 101 98 97*  CO2 25 28 28   GLUCOSE 123* 105* 93  BUN 15 15 21   CREATININE 0.78 0.81 0.85  CALCIUM 8.0* 7.9* 8.0*  MG  --  1.8 2.1  PHOS  --  2.9  --     GFR: Estimated Creatinine Clearance: 87.5 mL/min (by C-G formula based on SCr of 0.85 mg/dL). Liver Function Tests: Recent Labs  Lab 03/06/21 0214  AST 83*  ALT 47*  ALKPHOS 77  BILITOT 1.0  PROT 6.3*  ALBUMIN 2.7*    No results for input(s): LIPASE, AMYLASE in the  last 168 hours. No results for input(s): AMMONIA in the last 168 hours. Coagulation Profile: No results for input(s): INR, PROTIME in the last 168 hours. Cardiac Enzymes: No results for input(s): CKTOTAL, CKMB, CKMBINDEX, TROPONINI in the last 168 hours. BNP (last 3 results) No results for input(s): PROBNP in the last 8760 hours. HbA1C: No results for input(s): HGBA1C in the last 72 hours. CBG: Recent Labs  Lab 03/11/21 1153 03/11/21 1650 03/11/21 2130 03/12/21 0737 03/12/21 1159  GLUCAP 127* 128* 109* 103* 101*    Lipid Profile: No results for input(s): CHOL, HDL, LDLCALC, TRIG, CHOLHDL, LDLDIRECT in the last 72 hours. Thyroid Function Tests: No results for input(s): TSH, T4TOTAL, FREET4, T3FREE, THYROIDAB in the last 72 hours. Anemia Panel: No results for input(s): VITAMINB12, FOLATE, FERRITIN, TIBC, IRON, RETICCTPCT in the last 72 hours. Sepsis Labs: No results for input(s): PROCALCITON, LATICACIDVEN in the last 168 hours.  No results found for this or any previous visit (from the past 240 hour(s)).     Radiology Studies: No results found.    Scheduled Meds:  (feeding supplement) PROSource Plus  30 mL Oral BID BM   amiodarone  200 mg Oral BID   chlorhexidine  15 mL Mouth Rinse BID   feeding supplement  237 mL Oral BID BM   feeding supplement  237 mL Oral Q24H   folic acid  1 mg Oral Daily   furosemide  40 mg Oral BID   heparin injection (subcutaneous)  5,000 Units Subcutaneous Q8H   hydrocortisone  10 mg Oral BID   magnesium oxide  400 mg Oral Daily   multivitamin with minerals  1 tablet Oral Daily   nicotine  14 mg Transdermal Daily   pantoprazole  40 mg Oral Daily   sodium chloride flush  10-40 mL Intracatheter Q12H   thiamine  100 mg Oral Daily   Or   thiamine  100 mg Intravenous Daily   Continuous Infusions:  sodium chloride Stopped (03/03/21 1208)     LOS: 18 days      Time spent: 25 minutes   05/12/21, DO [facility] 03/12/2021, 1:22  PM   Available via Epic secure chat 7am-7pm After these hours, please refer to coverage provider listed on [email or web address]

## 2021-03-12 NOTE — Progress Notes (Addendum)
ANTICOAGULATION CONSULT NOTE - Initial Consult  Pharmacy Consult for IV heparin infusion Indication: pulmonary embolus  No Known Allergies  Patient Measurements: Height: 6\' 2"  (188 cm) Weight: 74.4 kg (164 lb 0.4 oz) IBW/kg (Calculated) : 82.2 Heparin Dosing Weight: 74.4 kg  Vital Signs: Temp: 97.5 F (36.4 C) (09/09 1202) Temp Source: Oral (09/09 1202) BP: 103/71 (09/09 1202) Pulse Rate: 87 (09/09 1202)  Labs: Recent Labs    03/10/21 0352 03/11/21 0346 03/12/21 0849 03/12/21 1057  HGB 8.5*  --   --   --   HCT 25.3*  --   --   --   PLT 177  --   --   --   CREATININE 0.81 0.85  --   --   TROPONINIHS  --   --  21* 21*    Estimated Creatinine Clearance: 87.5 mL/min (by C-G formula based on SCr of 0.85 mg/dL).   Medical History: Past Medical History:  Diagnosis Date   Alcohol abuse    Atrial flutter (HCC)    Mitral regurgitation    Seizures (HCC)     Medications:  No anticoagulation medications prior to admission Active meds include heparin 5000 units subq q8h for DVT prophylaxis  Assessment: 70 y.o. male with medical history significant of alcohol abuse, alcohol withdrawal seizures, atypical flutter, and pulmonary hypertension found in atrial fibrillation in setting up hypoglycemia, hypotension, possible sepsis of unknown etiology (resolved) and adrenal insufficiency now with pulmonary embolism.   9/9 CT Angio Chest: Pulmonary embolus of the distal interlobar pulmonary artery and right lower lobe lobar artery and bilateral segmental/subsegmental pulmonary arteries.  Pulmonary embolus of the distal right interlobar pulmonary artery and right lower lobe has a more peripheral configuration, suggesting chronic component.  9/9 Bilateral lower extremity venous duplex  - no evidence of DVT  Goal of Therapy:  Heparin level 0.3-0.7 units/ml Monitor platelets by anticoagulation protocol: Yes   Plan:  Discontinue subcutaneous heparin  Heparin 2500 units IV bolus x  1 Heparin 1350 units/hr continuous infusion 6 hr Heparin level Monitor daily heparin level, CBC, s/s bleeding   11/9, PharmD, BCPS Clinical Pharmacist [place] Please utilize Amion for appropriate phone number to reach the unit pharmacist [facility]) 03/12/2021 3:16 PM

## 2021-03-13 ENCOUNTER — Other Ambulatory Visit (HOSPITAL_COMMUNITY): Payer: Self-pay

## 2021-03-13 LAB — CBC
HCT: 28.2 % — ABNORMAL LOW (ref 39.0–52.0)
Hemoglobin: 9.3 g/dL — ABNORMAL LOW (ref 13.0–17.0)
MCH: 30.1 pg (ref 26.0–34.0)
MCHC: 33 g/dL (ref 30.0–36.0)
MCV: 91.3 fL (ref 80.0–100.0)
Platelets: 211 10*3/uL (ref 150–400)
RBC: 3.09 MIL/uL — ABNORMAL LOW (ref 4.22–5.81)
RDW: 25.2 % — ABNORMAL HIGH (ref 11.5–15.5)
WBC: 9.2 10*3/uL (ref 4.0–10.5)
nRBC: 0.3 % — ABNORMAL HIGH (ref 0.0–0.2)

## 2021-03-13 LAB — BASIC METABOLIC PANEL
Anion gap: 11 (ref 5–15)
BUN: 27 mg/dL — ABNORMAL HIGH (ref 8–23)
CO2: 28 mmol/L (ref 22–32)
Calcium: 8.3 mg/dL — ABNORMAL LOW (ref 8.9–10.3)
Chloride: 96 mmol/L — ABNORMAL LOW (ref 98–111)
Creatinine, Ser: 1.01 mg/dL (ref 0.61–1.24)
GFR, Estimated: >60 mL/min (ref >60)
Glucose, Bld: 103 mg/dL — ABNORMAL HIGH (ref 70–99)
Potassium: 4.1 mmol/L (ref 3.5–5.1)
Sodium: 135 mmol/L (ref 135–145)

## 2021-03-13 LAB — HEPARIN LEVEL (UNFRACTIONATED): Heparin Unfractionated: 0.6 IU/mL (ref 0.30–0.70)

## 2021-03-13 LAB — GLUCOSE, CAPILLARY
Glucose-Capillary: 147 mg/dL — ABNORMAL HIGH (ref 70–99)
Glucose-Capillary: 303 mg/dL — ABNORMAL HIGH (ref 70–99)
Glucose-Capillary: 86 mg/dL (ref 70–99)
Glucose-Capillary: 122 mg/dL — ABNORMAL HIGH (ref 70–99)

## 2021-03-13 LAB — GLUCOSE, RANDOM: Glucose, Bld: 103 mg/dL — ABNORMAL HIGH (ref 70–99)

## 2021-03-13 MED ORDER — RIVAROXABAN (XARELTO) VTE STARTER PACK (15 & 20 MG)
ORAL_TABLET | ORAL | 0 refills | Status: DC
  Filled 2021-03-13: qty 51, 30d supply, fill #0

## 2021-03-13 MED ORDER — FUROSEMIDE 40 MG PO TABS
40.0000 mg | ORAL_TABLET | Freq: Two times a day (BID) | ORAL | Status: DC
  Administered 2021-03-14: 40 mg via ORAL
  Filled 2021-03-13: qty 1

## 2021-03-13 MED ORDER — RIVAROXABAN 15 MG PO TABS
15.0000 mg | ORAL_TABLET | Freq: Two times a day (BID) | ORAL | Status: DC
  Administered 2021-03-13 – 2021-03-14 (×3): 15 mg via ORAL
  Filled 2021-03-13 (×3): qty 1

## 2021-03-13 MED ORDER — RIVAROXABAN 20 MG PO TABS: 20.0000 mg | ORAL_TABLET | Freq: Every day | ORAL | Status: DC

## 2021-03-13 NOTE — Discharge Summary (Addendum)
Physician Discharge Summary  Roy Ryan YWV:[id number] DOB: Nov 19, 1950 DOA: 02/22/2021  PCP: Patient, No Pcp Per (Inactive)  Admit date: 02/22/2021 Discharge date: 03/14/2021  Admitted From: Hotel Disposition:  SNF   Recommendations for Outpatient Follow-up:  Follow up with PCP in 1 week Follow up with cardiology Dr. Allyson Sabal in 1 to 2 weeks   Discharge Condition: Stable CODE STATUS: DNR  Diet recommendation: Heart healthy, fluid restriction diet   Brief/Interim Summary: Roy Ryan is a 70 year old male with medical history of alcohol abuse, alcohol withdrawal seizures, atrial flutter, pulmonary hypertension presented to ED via EMS for evaluation of altered mental status.  Patient currently residing at hotel and was found to be hypoglycemic with CBG 37.  EMS reported poor living conditions, feces and urine bed, rotting food, multiple alcohol bottles.  Blood pressure was soft was given IV fluid bolus.  In the ED he was hypothermic with rectal temperature 95.2.  He was placed on [name]  Initially placed on empiric antibiotics for severe sepsis however no clear source of infection identified.  Sepsis ruled out.  Due to persistent hypotension PCCM was consulted, found to have very low cortisol 2.1, cosyntropin ordered for adrenal insufficiency.  He received IV fluid boluses, noted on pressor support.  Patient also found to be in A. fib with RVR.  Cardiology consulted.  Chest x-ray also showed fractures at C5-6-7. He was maintained on dextrose infusion for hypoglycemia and intermittently on BiPAP.  Patient's mentation as well as oxygenation continued to improve during hospital stay.  PT OT has recommended SNF placement. Due to patient's complaint of chest pain, work up was completed which showed PE. He was started on IV heparin then transitioned to Xarelto (chosen over Eliquis for eventual once daily dosing for compliance).   Discharge Diagnoses:  Principal Problem:   Hypotension Active  Problems:   Atrial fibrillation with rapid ventricular response (HCC)   Hypomagnesemia   Failure to thrive in adult   Hypoglycemia   Hypothermia   Protein-calorie malnutrition, severe   Nonrheumatic mitral valve regurgitation   Palliative care by specialist   Goals of care, counseling/discussion   General weakness   Toxic metabolic encephalopathy: -Multifactorial. EEG with moderate diffuse encephalopathy, no seizures or epilepsy.  CT head unremarkable. B12 and ammonia levels normal. Remains on vitamin supplements.  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  No infectious source was identified.  Sepsis was ruled out.  Started on empiric antibiotics and was discontinued. Blood pressure responded to steroid replacement.     Multiple compression fractures cervical and thoracic spine: -Pain management, PT OT recommending SNF placement   New onset A. fib with RVR: -Seen by cardiology.  Currently on amiodarone. Remains rate controlled A. fib. -Follow-up with cardiology as outpatient   Alcohol abuse with withdrawals:  -Currently out of withdrawal window.  On vitamin supplements.   Subclinical hypothyroidism: -TSH 7.1.  T4 1.2.  Started on thyroxine.    Mitral regurgitation with prolapse, acute pulmonary edema with acute hypoxemic respiratory failure:  -Severe MR. Status post TEE 8/25.  Deemed to be poor surgical candidate.  Palliative following. -Continue oral Lasix. -Follow-up with cardiology as outpatient   Hypoglycemia:  -Due to poor oral intake.  Blood sugar improved.   Goal of care: -Patient remains with severe medical morbidities and poor survival.  He has multiple medical issues that indicate poor prognosis. No family members or next of kin was able to be reached. Patient  is DNR/DNI.    PE: -Troponin only mildly elevated and flat in trend 21 --> 21 -D dimer elevated 3.31 -CTA chest positive  for PE -IV heparin --> Xarelto -DVT US neg for DVT   Discharge Instructions  Discharge Instructions     Diet - low sodium heart healthy   Complete by: As directed    Increase activity slowly   Complete by: As directed       Allergies as of 03/13/2021   No Known Allergies      Medication List     TAKE these medications    amiodarone 200 MG tablet Commonly known as: PACERONE Take 1 tablet (200 mg total) by mouth 2 (two) times daily.   folic acid 1 MG tablet Commonly known as: FOLVITE Take 1 tablet (1 mg total) by mouth daily.   furosemide 40 MG tablet Commonly known as: LASIX Take 1 tablet (40 mg total) by mouth 2 (two) times daily.   hydrocortisone 10 MG tablet Commonly known as: CORTEF Take 1 tablet (10 mg total) by mouth 2 (two) times daily.   multivitamin with minerals Tabs tablet Take 1 tablet by mouth daily.   pantoprazole 40 MG tablet Commonly known as: PROTONIX Take 1 tablet (40 mg total) by mouth daily.   potassium chloride 10 MEQ tablet Commonly known as: KLOR-CON Take 1 tablet (10 mEq total) by mouth 2 (two) times daily.   Rivaroxaban Stater Pack (15 mg and 20 mg) Commonly known as: XARELTO STARTER PACK Follow package directions: Take one 15mg  tablet by mouth twice a day. On day 22, switch to one 20mg  tablet once a day. Take with food.   thiamine 100 MG tablet Take 1 tablet (100 mg total) by mouth daily.        Contact information for follow-up providers     Corrin Parker, PA-C. Schedule an appointment as soon as possible for a visit in 2 week(s).   Specialties: Physician Assistant, Cardiology Contact information: [address] [place] [address] [place] [address] [phone]         Runell Gess, MD Follow up.   Specialties: Cardiology, Radiology Contact information: [address] [place] [address] [phone]         [facility]. Schedule an appointment as soon as  possible for a visit in 1 week(s).   Contact information: [address] [place] [address] [phone]             Contact information for after-discharge care     Destination     [facility] .   Service: Skilled Nursing Contact information: [address] [place] [address] [phone]                    No Known Allergies  Consultations: PCCM Cardiology Palliative care    Procedures/Studies: DG Abd 1 View  Result Date: 03/01/2021 CLINICAL DATA:  Abdominal pain for 2 days EXAM: ABDOMEN - 1 VIEW COMPARISON:  Chest from 02/22/2021 FINDINGS: Scattered large and small bowel gas is noted. No free air is seen. Increased central vascular congestion is noted when compare with the recent exam. No bony abnormality is noted. Left hip replacement is noted. IMPRESSION: No obstructive changes are seen. Increased central vascular congestion. Electronically Signed   By: Alcide Clever M.D.   On: 03/01/2021 09:58   CT HEAD WO CONTRAST ( )  Result Date: 02/22/2021 CLINICAL DATA:  Delirium EXAM: CT HEAD WITHOUT CONTRAST TECHNIQUE: Contiguous axial images were obtained from the base of the skull through the vertex without intravenous contrast. COMPARISON:  01/31/2020 FINDINGS: Brain: No evidence of acute infarction, hemorrhage, hydrocephalus, extra-axial collection or mass lesion/mass effect. Mild low-density changes within the periventricular and subcortical white matter compatible with chronic microvascular ischemic change. Mild diffuse cerebral volume loss. Vascular: Atherosclerotic calcifications involving the large vessels of the skull base. No unexpected hyperdense vessel. Skull: Normal. Negative for fracture or focal lesion. Sinuses/Orbits: Chronic complete right maxillary sinus opacification with associated bony thickening of the maxillary sinus walls. Mucosal thickening within the bilateral ethmoid air cells and  bilateral frontal sinuses. Air-fluid level in the left frontal sinus. Other: None. IMPRESSION: 1. No acute intracranial findings. 2. Mild chronic microvascular ischemic change and cerebral volume loss. 3. Chronic right maxillary sinus disease. Air-fluid level in the left frontal sinus may represent acute on chronic sinusitis. Electronically Signed   By: Duanne GuessNicholas  Plundo D.O.   On: 02/22/2021 16:02   CT Angio Chest Pulmonary Embolism (PE) W or WO Contrast  Result Date: 03/12/2021 CLINICAL DATA:  Hypotension EXAM: CT ANGIOGRAPHY CHEST WITH CONTRAST TECHNIQUE: Multidetector CT imaging of the chest was performed using the standard protocol during bolus administration of intravenous contrast. Multiplanar CT image reconstructions and MIPs were obtained to evaluate the vascular anatomy. CONTRAST:  100mL OMNIPAQUE IOHEXOL 350 MG/ML SOLN COMPARISON:  CT chest dated January 31, 2020 FINDINGS: Cardiovascular: Cardiomegaly. No pericardial effusion. Adequate contrast opacification of the pulmonary arteries. Reflux of contrast noted into the hepatic arteries. Pulmonary embolus of the interlobar pulmonary artery and right lower lobe lobar artery and bilateral segmental/subsegmental pulmonary arteries. Pulmonary embolus of the interlobar pulmonary artery and right lower lobe pulmonary artery has a more peripheral configuration. Mediastinum/Nodes: Visualized thyroid and esophagus are unremarkable. Mild mediastinal and hilar adenopathy, likely reactive. Lungs/Pleura: Central airways are patent. Moderate bilateral pleural effusions with left-sided loculations. Upper lobe predominant ground-glass opacities with areas of consolidation. Upper Abdomen: Small volume abdominal ascites. Musculoskeletal: Unchanged wedge deformity of the midthoracic spine. No aggressive appearing osseous lesions Review of the MIP images confirms the above findings. IMPRESSION: Pulmonary embolus of the distal interlobar pulmonary artery and right lower lobe  lobar artery and bilateral segmental/subsegmental pulmonary arteries. Pulmonary embolus of the distal right interlobar pulmonary artery and right lower lobe has a more peripheral configuration, suggesting chronic component. Moderate bilateral pleural effusions with left-sided loculations. Upper lobe predominant ground-glass opacities with areas of consolidation, likely due to pulmonary edema. Small volume abdominal ascites. Critical Value/emergent results were called by telephone at the time of interpretation on 03/12/2021 at 2:16 pm to provider [facility]ENNIFER Madigan Rosensteel , who verbally acknowledged these results. Electronically Signed   By: Allegra LaiLeah  Strickland M.D.   On: 03/12/2021 14:16   DG CHEST PORT 1 VIEW  Result Date: 03/01/2021 CLINICAL DATA:  Shortness of breath, altered mental status EXAM: PORTABLE CHEST 1 VIEW COMPARISON:  02/22/2021 FINDINGS: Cardiomegaly. There is new, diffuse bilateral heterogeneous and interstitial airspace opacity and small layering bilateral pleural effusions. IMPRESSION: Cardiomegaly with new, diffuse bilateral heterogeneous and interstitial airspace opacity and small layering bilateral pleural effusions, concerning for pulmonary edema. Electronically Signed   By: Lauralyn PrimesAlex  Bibbey M.D.   On: 03/01/2021 16:14   DG Chest Portable 1 View  Addendum Date: 02/23/2021   ADDENDUM REPORT: 02/23/2021 12:15 ADDENDUM: Voice recognition error: The last sentence of the Findings section should read "compression fractures at T5, T6 and T7. These fractures are in the thoracic spine.  They are stable from the CT scan 01/01/2020. No evidence for progression. " Electronically Signed   By: Marin Roberts M.D.   On: 02/23/2021 12:15   Result Date: 02/23/2021 CLINICAL DATA:  Delirium. EXAM: PORTABLE CHEST 1 VIEW COMPARISON:  One-view scratched at two-view chest x-ray and CTA chest 01/31/2020 FINDINGS: Heart is upper limits of normal. 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 No edema or effusion is present. No focal  airspace disease present. Degenerative changes are again noted in the thoracic spine. Progression fractures are again noted at C5, C6, and C7. IMPRESSION: 1. No acute cardiopulmonary disease or significant interval change. 2. Progression fractures at C5, C6, and C7. Electronically Signed: By: Marin Roberts M.D. On: 02/22/2021 14:43   EEG adult  Result Date: 02/23/2021 Charlsie Quest, MD     02/23/2021 12:51 PM Patient Name: Kacper Cartlidge MRN: [id number] Epilepsy Attending: Charlsie Quest Referring Physician/Provider: Dr John Giovanni, Date: 02/23/2021 Duration: 21.03 mins Patient history: 70 year old male with altered mental status.  EEG to evaluate for seizures. Level of alertness: Awake AEDs during EEG study: None Technical aspects: This EEG study was done with scalp electrodes positioned according to the 10-20 International system of electrode placement. Electrical activity was acquired at a sampling rate of  and reviewed with a high frequency filter of  and a low frequency filter of . EEG data were recorded continuously and digitally stored. Description: No clear posterior dominant rhythm was seen.  EEG showed continuous generalized 3 to 5 Hz theta-delta slowing. Hyperventilation and photic stimulation were not performed.   ABNORMALITY - Continuous slow, generalized IMPRESSION: This study is suggestive of moderate diffuse encephalopathy, nonspecific etiology. No seizures or epileptiform discharges were seen throughout the recording. Charlsie Quest   ECHOCARDIOGRAM COMPLETE  Result Date: 02/23/2021    ECHOCARDIOGRAM REPORT   Patient Name:   LORANCE PICKERAL Date of Exam: 02/23/2021 Medical Rec #:  [id number]    Height:       74.0 in Accession #:    [id number]   Weight:       116.8 lb Date of Birth:  [birth year]     BSA:          1.730 m Patient Age:    69 years     BP:           91/63 mmHg Patient Gender: M            HR:           88 bpm. Exam Location:  Inpatient Procedure: 2D Echo,  3D Echo, Cardiac Doppler and Color Doppler Indications:    I48.91* Unspeicified atrial fibrillation  History:        Patient has prior history of Echocardiogram examinations, most                 recent 01/31/2020. CHF, Abnormal ECG, Mitral Valve Prolapse and                 Mitral Valve Disease, Arrythmias:Atrial Fibrillation;                 Signs/Symptoms:Hypotension and Chest Pain. ETOH.  Sonographer:    Sheralyn Boatman RDCS Referring Phys: [id number] VASUNDHRA RATHORE IMPRESSIONS  1. 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 Very eccentric, anteriorly directed jet of mitral regurgitation. P2 scallop prolapse/possible flail noted on 3D images.. The mitral valve is myxomatous. Severe mitral valve regurgitation. No evidence of mitral stenosis.  2. Left ventricular ejection fraction, by estimation, is 50%. Left  ventricular ejection fraction by 3D volume is 53 %. The left ventricle has mildly decreased function. The left ventricle has no regional wall motion abnormalities. There is mild left ventricular hypertrophy. Left ventricular diastolic parameters are indeterminate.  3. Right ventricular systolic function is moderately reduced. The right ventricular size is mildly enlarged. There is moderately elevated pulmonary artery systolic pressure. The estimated right ventricular systolic pressure is 52.2 mmHg.  4. Left atrial size was severely dilated.  5. Right atrial size was moderately dilated.  6. Tricuspid valve regurgitation is moderate.  7. The aortic valve is tricuspid. There is mild thickening of the aortic valve. Aortic valve regurgitation is mild. No aortic stenosis is present.  8. Aortic dilatation noted. There is borderline dilatation of the aortic root, measuring 39 mm.  9. The inferior vena cava is dilated in size with <50% respiratory variability, suggesting right atrial pressure of 15 mmHg. Conclusion(s)/Recommendation(s): Recommend TEE for further evaluation of mitral valve prolapse. FINDINGS  Left  Ventricle: Left ventricular ejection fraction, by estimation, is 50%. Left ventricular ejection fraction by 3D volume is 53 %. The left ventricle has mildly decreased function. The left ventricle has no regional wall motion abnormalities. The left ventricular internal cavity size was normal in size. There is mild left ventricular hypertrophy. Left ventricular diastolic parameters are indeterminate. Right Ventricle: The right ventricular size is mildly enlarged. No increase in right ventricular wall thickness. Right ventricular systolic function is moderately reduced. There is moderately elevated pulmonary artery systolic pressure. The tricuspid regurgitant velocity is 3.05 m/s, and with an assumed right atrial pressure of 15 mmHg, the estimated right ventricular systolic pressure is 52.2 mmHg. Left Atrium: Left atrial size was severely dilated. Right Atrium: Right atrial size was moderately dilated. Pericardium: There is no evidence of pericardial effusion. Mitral Valve: Bileaflet mitral valve prolapse, posterior > anterior. Very eccentric, anteriorly directed jet of mitral regurgitation. P2 scallop prolapse/possible flail noted on 3D images. The mitral valve is myxomatous. Severe mitral valve regurgitation. No evidence of mitral valve stenosis. Tricuspid Valve: The tricuspid valve is normal in structure. Tricuspid valve regurgitation is moderate . No evidence of tricuspid stenosis. Aortic Valve: The aortic valve is tricuspid. There is mild thickening of the aortic valve. Aortic valve regurgitation is mild. Aortic regurgitation PHT measures 1091 msec. No aortic stenosis is present. Pulmonic Valve: The pulmonic valve was grossly normal. Pulmonic valve regurgitation is trivial. No evidence of pulmonic stenosis. Aorta: Aortic dilatation noted. There is borderline dilatation of the aortic root, measuring 39 mm. Venous: The inferior vena cava is dilated in size with less than 50% respiratory variability, suggesting right  atrial pressure of 15 mmHg. IAS/Shunts: No atrial level shunt detected by color flow Doppler.  LEFT VENTRICLE PLAX 2D LVIDd:         4.70 cm LVIDs:         3.10 cm LV PW:         1.00 cm         3D Volume EF LV IVS:        1.10 cm         LV 3D EF:    Left LVOT diam:     1.90 cm                      ventricul LVOT Area:     2.84 cm                     ar  ejection                                             fraction LV Volumes (MOD)                            by 3D LV vol d, MOD    110.0 ml                   volume is A2C:                                        53 %. LV vol d, MOD    77.2 ml A4C: LV vol s, MOD    68.3 ml       3D Volume EF: A2C:                           3D EF:        53 % LV vol s, MOD    43.9 ml A4C: LV SV MOD A2C:   41.7 ml LV SV MOD A4C:   77.2 ml LV SV MOD BP:    40.3 ml RIGHT VENTRICLE            IVC RV S prime:     4.79 cm/s  IVC diam: 2.40 cm TAPSE (M-mode): 1.0 cm LEFT ATRIUM              Index       RIGHT ATRIUM           Index LA diam:        4.80 cm  2.78 cm/m  RA Area:     24.20 cm LA Vol (A2C):   170.0 ml 98.29 ml/m RA Volume:   86.80 ml  50.18 ml/m LA Vol (A4C):   71.1 ml  41.11 ml/m LA Biplane Vol: 111.0 ml 64.18 ml/m  AORTIC VALVE AI PHT:      1091 msec  AORTA Ao Root diam: 3.90 cm Ao Asc diam:  3.00 cm MITRAL VALVE                 TRICUSPID VALVE MV Area (PHT): 5.27 cm      TR Peak grad:   37.2 mmHg MV Decel Time: 144 msec      TR Vmax:        305.00 cm/s MR Peak grad:    72.9 mmHg MR Mean grad:    43.0 mmHg   SHUNTS MR Vmax:         427.00 cm/s Systemic Diam: 1.90 cm MR Vmean:        308.0 cm/s MR PISA:         6.93 cm MR PISA Eff ROA: 62 mm MR PISA Radius:  1.05 cm MV E velocity: 143.67 cm/s Weston Brass MD Electronically signed by Weston Brass MD Signature Date/Time: 02/23/2021/5:18:37 PM    Final    ECHO TEE  Result Date: 02/25/2021    TRANSESOPHOGEAL ECHO REPORT   Patient Name:   MEDARDO HASSING Date of Exam: 02/25/2021  Medical Rec #:  [id number]    Height:       74.0 in Accession #:  [id number]   Weight:       116.8 lb Date of Birth:  13-Jul-1950     BSA:          1.730 m Patient Age:    46 years     BP:           91/61 mmHg Patient Gender: M            HR:           110 bpm. Exam Location:  Inpatient Procedure: Transesophageal Echo, 3D Echo, Color Doppler and Cardiac Doppler Indications:     I34.1 Nonrheumatic mitral (valve) prolapse  History:         Patient has prior history of Echocardiogram examinations, most                  recent 02/23/2021. Arrythmias:Atrial Fibrillation; Risk                  Factors:ETOH Abuse.  Sonographer:     Irving Burton Senior RDCS Referring Phys:  [id number] Corrin Parker Diagnosing Phys: Thurmon Fair MD PROCEDURE: After discussion of the risks and benefits of a TEE, an informed consent was obtained from the patient. The transesophogeal probe was passed without difficulty through the esophogus of the patient. Local oropharyngeal anesthetic was provided with Cetacaine. Sedation performed by different physician. The patient was monitored while under deep sedation. Anesthestetic sedation was provided intravenously by Anesthesiology: 100mg  of Propofol, 40mg  of Lidocaine. The patient developed no complications during the procedure. IMPRESSIONS  1. Left ventricular ejection fraction, by estimation, is 60 to 65%. The left ventricle has normal function. The left ventricle has no regional wall motion abnormalities. Left ventricular diastolic function could not be evaluated.  2. Right ventricular systolic function is mildly reduced. The right ventricular size is normal. There is moderately elevated pulmonary artery systolic pressure.  3. Left atrial size was moderately dilated. No left atrial/left atrial appendage thrombus was detected.  4. Right atrial size was moderately dilated.  5. Moderate pleural effusion in the left lateral region.  6. There is severe holosystolic prolapse of both mitral leaflets, but this  is particularly severe at the level of the middle (P2) scallop and medial (P3) scallop of the posterior leaflet. P3 may be flail. The vena contracta is 6 mm and the effective regurgitant area is 1.3 cm. The regurgitant volume is 130 ml, the regurgitant fraction is 80%. The maximum flail gap is 8 mm (at P3). MV area is 6 cm. The mitral valve is myxomatous. Severe mitral valve regurgitation. The mean mitral valve gradient is 3.5 mmHg.  7. The tricuspid valve is myxomatous. Tricuspid valve regurgitation is mild to moderate.  8. The aortic valve is tricuspid. Aortic valve regurgitation is not visualized. No aortic stenosis is present.  9. There is severe spontaneous echo contrast seen in the ascending aorta, consistent with very low forward cardiac output. FINDINGS  Left Ventricle: Left ventricular ejection fraction, by estimation, is 60 to 65%. The left ventricle has normal function. The left ventricle has no regional wall motion abnormalities. The left ventricular internal cavity size was normal in size. There is  no left ventricular hypertrophy. Left ventricular diastolic function could not be evaluated due to atrial fibrillation. Left ventricular diastolic function could not be evaluated. Right Ventricle: The right ventricular size is normal. No increase in right ventricular wall thickness. Right ventricular systolic function is mildly reduced. There is moderately elevated pulmonary artery systolic pressure. The tricuspid regurgitant velocity is  3.06 m/s, and with an assumed right atrial pressure of 10 mmHg, the estimated right ventricular systolic pressure is 47.5 mmHg. Left Atrium: Left atrial size was moderately dilated. No left atrial/left atrial appendage thrombus was detected. Right Atrium: Right atrial size was moderately dilated. Pericardium: There is no evidence of pericardial effusion. Mitral Valve: There is severe holosystolic prolapse of both mitral leaflets, but this is particularly severe at the  level of the middle (P2) scallop and medial (P3) scallop of the posterior leaflet. P3 may be flail. The vena contracta is 6 mm and the effective regurgitant area is 1.3 cm. The regurgitant volume is 130 ml, the regurgitant fraction is 80%. The maximum flail gap is 8 mm (at P3). MV area is 6 cm. The mitral valve is myxomatous. Severe mitral valve regurgitation, with anteriorly-directed  jet. The mean mitral valve gradient is 3.5 mmHg with average heart rate of 120 bpm. Tricuspid Valve: The tricuspid valve is myxomatous. Tricuspid valve regurgitation is mild to moderate. There is mild prolapse of the tricuspid. Aortic Valve: The aortic valve is tricuspid. Aortic valve regurgitation is not visualized. No aortic stenosis is present. Pulmonic Valve: The pulmonic valve was normal in structure. Pulmonic valve regurgitation is mild. Aorta: There is severe spontaneous echo contrast seen in the ascending aorta, consistent with very low forward cardiac output. The aortic root, ascending aorta and aortic arch are all structurally normal, with no evidence of dilitation or obstruction. Venous: A pattern of systolic flow reversal, suggestive of severe mitral regurgitation is recorded from the right upper pulmonary vein. IAS/Shunts: No atrial level shunt detected by color flow Doppler. Additional Comments: There is a moderate pleural effusion in the left lateral region.  LEFT VENTRICLE PLAX 2D LVIDd:         4.60 cm LVIDs:         3.10 cm LVOT diam:     2.10 cm LV SV:         30 LV SV Index:   17 LVOT Area:     3.46 cm  LV Volumes (MOD) LV vol d, MOD A4C: 155.0 ml LV vol s, MOD A4C: 76.3 ml LV SV MOD A4C:     155.0 ml AORTIC VALVE LVOT Vmax:   77.40 cm/s LVOT Vmean:  50.000 cm/s LVOT VTI:    0.086 m MITRAL VALVE                   TRICUSPID VALVE MV Area (plan): 6.09 cm       TR Peak grad:   37.5 mmHg MV Mean grad:   3.5 mmHg       TR Vmax:        306.00 cm/s MR Peak grad:      72.6 mmHg MR Mean grad:      48.0 mmHg   SHUNTS MR  Vmax:           426.00 cm/s Systemic VTI:  0.09 m MR Vmean:          327.0 cm/s  Systemic Diam: 2.10 cm MR Vena Contracta: 0.57 cm MR PISA:           17.82 cm MR PISA Eff ROA:   129 mm MR PISA Radius:    1.68 cm Mihai Croitoru MD Electronically signed by Thurmon Fair MD Signature Date/Time: 02/25/2021/11:02:17 AM    Final    VAS [place] LOWER EXTREMITY VENOUS (DVT)  Result Date: 03/12/2021  Lower Venous DVT Study Patient Name:  ANDREY MCCASKILL  Date of Exam:   03/12/2021 Medical Rec #: [id number]     Accession #:    [id number] Date of Birth: January 03, 1951      Patient Gender: M Patient Age:   84 years Exam Location:  [facility] Procedure:      VAS [place] LOWER EXTREMITY VENOUS (DVT) Referring Phys: Victorino Dike Islam Villescas --------------------------------------------------------------------------------  Indications: Pulmonary embolism.  Risk Factors: None identified. Limitations: Poor ultrasound/tissue interface and patient positioning. Comparison Study: No prior studies. Performing Technologist: Chanda Busing RVT  Examination Guidelines: A complete evaluation includes B-mode imaging, spectral Doppler, color Doppler, and power Doppler as needed of all accessible portions of each vessel. Bilateral testing is considered an integral part of a complete examination. Limited examinations for reoccurring indications may be performed as noted. The reflux portion of the exam is performed with the patient in reverse Trendelenburg.  +---------+---------------+---------+-----------+----------+--------------+ RIGHT    CompressibilityPhasicitySpontaneityPropertiesThrombus Aging +---------+---------------+---------+-----------+----------+--------------+ CFV      Full           Yes      No                                  +---------+---------------+---------+-----------+----------+--------------+ SFJ      Full                                                         +---------+---------------+---------+-----------+----------+--------------+ FV Prox  Full                                                        +---------+---------------+---------+-----------+----------+--------------+ FV Mid   Full                                                        +---------+---------------+---------+-----------+----------+--------------+ FV DistalFull                                                        +---------+---------------+---------+-----------+----------+--------------+ PFV      Full                                                        +---------+---------------+---------+-----------+----------+--------------+ POP      Full           Yes      No                                  +---------+---------------+---------+-----------+----------+--------------+ PTV      Full                                                        +---------+---------------+---------+-----------+----------+--------------+  PERO     Full                                                        +---------+---------------+---------+-----------+----------+--------------+   +---------+---------------+---------+-----------+----------+--------------+ LEFT     CompressibilityPhasicitySpontaneityPropertiesThrombus Aging +---------+---------------+---------+-----------+----------+--------------+ CFV      Full           Yes      No                                  +---------+---------------+---------+-----------+----------+--------------+ SFJ      Full                                                        +---------+---------------+---------+-----------+----------+--------------+ FV Prox  Full                                                        +---------+---------------+---------+-----------+----------+--------------+ FV Mid   Full                                                         +---------+---------------+---------+-----------+----------+--------------+ FV DistalFull                                                        +---------+---------------+---------+-----------+----------+--------------+ PFV      Full                                                        +---------+---------------+---------+-----------+----------+--------------+ POP      Full           Yes      No                                  +---------+---------------+---------+-----------+----------+--------------+ PTV      Full                                                        +---------+---------------+---------+-----------+----------+--------------+ PERO     Full                                                        +---------+---------------+---------+-----------+----------+--------------+  Summary: RIGHT: - There is no evidence of deep vein thrombosis in the lower extremity.  - No cystic structure found in the popliteal fossa.  LEFT: - There is no evidence of deep vein thrombosis in the lower extremity.  - No cystic structure found in the popliteal fossa.  *See table(s) above for measurements and observations. Electronically signed by Heath Lark on 03/12/2021 at 4:39:01 PM.    Final       Discharge Exam: Vitals:   03/12/21 2115 03/13/21 0551  BP: 101/65 100/72  Pulse: 99 94  Resp:  20  Temp: (!) 97.5 F (36.4 C) 97.6 F (36.4 C)  SpO2: 98% 95%    General: Pt is alert, awake, not in acute distress Cardiovascular: RRR, S1/S2 +, no edema Respiratory: CTA bilaterally, no wheezing, no rhonchi, no respiratory distress, no conversational dyspnea, on room air  Abdominal: Soft, NT, ND, bowel sounds + Extremities: no edema, no cyanosis Psych: Flat affect   The results of significant diagnostics from this hospitalization (including imaging, microbiology, ancillary and laboratory) are listed below for reference.     Microbiology: No results found for this or  any previous visit (from the past 240 hour(s)).   Labs: BNP (last 3 results) No results for input(s): BNP in the last 8760 hours. Basic Metabolic Panel: Recent Labs  Lab 03/10/21 0352 03/11/21 0346 03/13/21 0359  NA 135 135 135  K 2.8* 3.5 4.1  CL 98 97* 96*  CO2 GLUCOSE 105* 93 103*  BUN 15 21 27*  CREATININE 0.81 0.85 1.01  CALCIUM 7.9* 8.0* 8.3*  MG 1.8 2.1  --   PHOS 2.9  --   --    Liver Function Tests: No results for input(s): AST, ALT, ALKPHOS, BILITOT, PROT, ALBUMIN in the last 168 hours. No results for input(s): LIPASE, AMYLASE in the last 168 hours. No results for input(s): AMMONIA in the last 168 hours. CBC: Recent Labs  Lab 03/10/21 0352 03/13/21 0359  WBC 7.8 9.2  NEUTROABS 5.5  --   HGB 8.5* 9.3*  HCT 25.3* 28.2*  MCV 89.4 91.3  PLT 177 211   Cardiac Enzymes: No results for input(s): CKTOTAL, CKMB, CKMBINDEX, TROPONINI in the last 168 hours. BNP: Invalid input(s): POCBNP CBG: Recent Labs  Lab 03/12/21 0737 03/12/21 1159 03/12/21 1600 03/12/21 2115 03/13/21 0748  GLUCAP 103* 101* 157* 95 147*   D-Dimer Recent Labs    03/12/21 0849  DDIMER 3.31*   Hgb A1c No results for input(s): HGBA1C in the last 72 hours. Lipid Profile No results for input(s): CHOL, HDL, LDLCALC, TRIG, CHOLHDL, LDLDIRECT in the last 72 hours. Thyroid function studies No results for input(s): TSH, T4TOTAL, T3FREE, THYROIDAB in the last 72 hours.  Invalid input(s): FREET3 Anemia work up No results for input(s): VITAMINB12, FOLATE, FERRITIN, TIBC, IRON, RETICCTPCT in the last 72 hours. Urinalysis    Component Value Date/Time   COLORURINE AMBER (A) 02/22/2021 2130   APPEARANCEUR CLEAR 02/22/2021 2130   LABSPEC 1.018 02/22/2021 2130   PHURINE 5.0 02/22/2021 2130   GLUCOSEU >=500 (A) 02/22/2021 2130   HGBUR NEGATIVE 02/22/2021 2130   BILIRUBINUR NEGATIVE 02/22/2021 2130   KETONESUR 80 (A) 02/22/2021 2130   PROTEINUR 30 (A) 02/22/2021 2130   NITRITE  NEGATIVE 02/22/2021 2130   LEUKOCYTESUR NEGATIVE 02/22/2021 2130   Sepsis Labs Invalid input(s): PROCALCITONIN,  WBC,  LACTICIDVEN Microbiology No results found for this or any previous visit (from the past 240 hour(s)).   Patient was  seen and examined on the day of discharge and was found to be in stable condition. Time coordinating discharge: 35 minutes including assessment and coordination of care, as well as examination of the patient.   SIGNED:  Noralee Stain, DO [facility] 03/13/2021, 9:59 AM

## 2021-03-13 NOTE — Progress Notes (Signed)
ANTICOAGULATION CONSULT NOTE - follow up  Pharmacy Consult for IV heparin infusion Indication: pulmonary embolus  No Known Allergies  Patient Measurements: Height: 6\' 2"  (188 cm) Weight: 74.4 kg (164 lb 0.4 oz) IBW/kg (Calculated) : 82.2 Heparin Dosing Weight: 74.4 kg  Vital Signs: Temp: 97.5 F (36.4 C) (09/09 2115) Temp Source: Oral (09/09 2115) BP: 101/65 (09/09 2115) Pulse Rate: 99 (09/09 2115)  Labs: Recent Labs    03/11/21 0346 03/12/21 0849 03/12/21 1057 03/12/21 1429 03/12/21 2156 03/13/21 0359  HGB  --   --   --   --   --  9.3*  HCT  --   --   --   --   --  28.2*  PLT  --   --   --   --   --  211  APTT  --   --   --  27  --   --   LABPROT  --   --   --  15.4*  --   --   INR  --   --   --  1.2  --   --   HEPARINUNFRC  --   --   --   --  0.54 0.60  CREATININE 0.85  --   --   --   --  1.01  TROPONINIHS  --  21* 21*  --   --   --      Estimated Creatinine Clearance: 73.7 mL/min (by C-G formula based on SCr of 1.01 mg/dL).   Medical History: Past Medical History:  Diagnosis Date   Alcohol abuse    Atrial flutter (HCC)    Mitral regurgitation    Seizures (HCC)     Medications:  No anticoagulation medications prior to admission Active meds include heparin 5000 units subq q8h for DVT prophylaxis  Assessment: 70 y.o. male with medical history significant of alcohol abuse, alcohol withdrawal seizures, atypical flutter, and pulmonary hypertension found in atrial fibrillation in setting up hypoglycemia, hypotension, possible sepsis of unknown etiology (resolved) and adrenal insufficiency now with pulmonary embolism.   9/9 CT Angio Chest: Pulmonary embolus of the distal interlobar pulmonary artery and right lower lobe lobar artery and bilateral segmental/subsegmental pulmonary arteries.  Pulmonary embolus of the distal right interlobar pulmonary artery and right lower lobe has a more peripheral configuration, suggesting chronic component.  9/9 Bilateral  lower extremity venous duplex  - no evidence of DVT  03/13/2021 HL 0.6 therapeutic on 1350 units/hr Hgb 9.3, plts 211 No bleeding per RN   Goal of Therapy:  Heparin level 0.3-0.7 units/ml Monitor platelets by anticoagulation protocol: Yes   Plan:  Continue Heparin 1350 units/hr continuous infusion Monitor daily heparin level, CBC, s/s bleeding   05/13/2021 RPh 03/13/2021, 5:20 AM

## 2021-03-13 NOTE — Plan of Care (Signed)

## 2021-03-13 NOTE — Progress Notes (Signed)
  PROGRESS NOTE  Patient was supposed to discharge to SNF this afternoon.  However his fingerstick blood glucose revealed >600.  Confirmation with serum level was only 103.  Patient was hypotensive this afternoon 86/61.  He was satting 91% on room air and was placed on nasal cannula for comfort.  On examination, he appears to be about the same as previous examinations.  Continues to have left-sided chest pain and abdominal pain which has been present on and off for the past several days.  He has his lunch tray in front of him.  He denies any nausea.  He does not appear to be in any acute respiratory or physical distress.  He has no conversational dyspnea.  Admits that he has been urinating a lot today.  He did receive IV Lasix this morning.  Cancel IV Lasix scheduled for this afternoon and resume p.o. in the morning as long as blood pressure maintains.  Cancel discharge to SNF today.  Discussed with bedside RN and TOC.    Noralee Stain, DO [facility] 03/13/2021, 1:57 PM  Available via Epic secure chat 7am-7pm After these hours, please refer to coverage provider listed on [email or web address]

## 2021-03-13 NOTE — TOC Progression Note (Signed)
Transition of Care [facility]) - Progression Note    Patient Details  Name: Roy Ryan MRN: [id number] Date of Birth: 12-29-1950  [facility]) CM/SW Contact  Geni Bers, RN Phone Number: 03/13/2021, 2:03 PM  Clinical Narrative:    Sharin Mons and Nada Maclachlan canceled for today.   Expected Discharge Plan: Skilled Nursing Facility Barriers to Discharge: No Barriers Identified  Expected Discharge Plan and Services Expected Discharge Plan: Skilled Nursing Facility       Living arrangements for the past 2 months: Hotel/Motel Expected Discharge Date: 03/13/21                                     Social Determinants of Health (SDOH) Interventions    Readmission Risk Interventions No flowsheet data found.

## 2021-03-14 LAB — BASIC METABOLIC PANEL
Anion gap: 9 (ref 5–15)
BUN: 25 mg/dL — ABNORMAL HIGH (ref 8–23)
CO2: 28 mmol/L (ref 22–32)
Calcium: 8.4 mg/dL — ABNORMAL LOW (ref 8.9–10.3)
Chloride: 100 mmol/L (ref 98–111)
Creatinine, Ser: 0.79 mg/dL (ref 0.61–1.24)
GFR, Estimated: >60 mL/min (ref >60)
Glucose, Bld: 96 mg/dL (ref 70–99)
Potassium: 4.5 mmol/L (ref 3.5–5.1)
Sodium: 137 mmol/L (ref 135–145)

## 2021-03-14 LAB — CBC
HCT: 27.1 % — ABNORMAL LOW (ref 39.0–52.0)
Hemoglobin: 8.9 g/dL — ABNORMAL LOW (ref 13.0–17.0)
MCH: 30.5 pg (ref 26.0–34.0)
MCHC: 32.8 g/dL (ref 30.0–36.0)
MCV: 92.8 fL (ref 80.0–100.0)
Platelets: 204 10*3/uL (ref 150–400)
RBC: 2.92 MIL/uL — ABNORMAL LOW (ref 4.22–5.81)
RDW: 25.2 % — ABNORMAL HIGH (ref 11.5–15.5)
WBC: 6.7 10*3/uL (ref 4.0–10.5)
nRBC: 0 % (ref 0.0–0.2)

## 2021-03-14 LAB — GLUCOSE, CAPILLARY
Glucose-Capillary: 109 mg/dL — ABNORMAL HIGH (ref 70–99)
Glucose-Capillary: 102 mg/dL — ABNORMAL HIGH (ref 70–99)

## 2021-03-14 NOTE — Plan of Care (Signed)

## 2021-03-14 NOTE — TOC Transition Note (Addendum)
Transition of Care [facility]) - CM/SW Discharge Note   Patient Details  Name: Roy Ryan MRN: [id number] Date of Birth: 1950-12-05  Transition of Care [facility]) CM/SW Contact:  Larrie Kass, LCSW Phone Number: 03/14/2021, 11:24 AM   Clinical Narrative:    CSW confirmed pt can be d/c  to [facility] today. CSW to contact [facility] for transfer. Call report to [phone].   Final next level of care: Skilled Nursing Facility Barriers to Discharge: No Barriers Identified   Patient Goals and CMS Choice Patient states their goals for this hospitalization and ongoing recovery are:: To get better CMS Medicare.gov Compare Post Acute Care list provided to:: Patient Choice offered to / list presented to : Patient  Discharge Placement                       Discharge Plan and Services                                     Social Determinants of Health (SDOH) Interventions     Readmission Risk Interventions No flowsheet data found.

## 2021-03-14 NOTE — Progress Notes (Signed)
  PROGRESS NOTE  Patient feeling well. Finished breakfast and on room air this morning. Denies chest pain. Ready for SNF.    Noralee Stain, DO [facility] 03/14/2021, 10:57 AM  Available via Epic secure chat 7am-7pm After these hours, please refer to coverage provider listed on [email or web address]

## 2021-03-14 NOTE — Progress Notes (Signed)
Patient discharged to [facility].  Transported via stretcher per [facility].  Valuables/belongings sent with patient.  No changes in status noted or reported upon his departure.  Report called to April, LPN.

## 2021-03-18 ENCOUNTER — Other Ambulatory Visit (HOSPITAL_COMMUNITY): Payer: Self-pay

## 2021-03-18 LAB — GLUCOSE, CAPILLARY: Glucose-Capillary: >600 mg/dL (ref 70–99)

## 2021-07-14 NOTE — Progress Notes (Signed)
Cardiology Office Note   Date:  07/15/2021   ID:  Roy Ryan, DOB Oct 06, 1950, MRN [id number]  PCP:  Patient, No Pcp Per (Inactive)  Cardiologist:   Nanetta Batty, MD   Chief Complaint  Patient presents with   Shortness of Breath      History of Present Illness:  Roy Ryan is a 71 y.o. male who presents for follow-up of atrial fibrillation.  The patient was last in the hospital in September of last year.  Hehas a history of pulmonary hypertension, alcohol use, poor living conditions.  He apparently had possible sepsis.  He had adrenal insufficiency.  When he was in the hospital he did have a diagnosis which demonstrated pulmonary embolism.  He was treated with IV heparin and then Xarelto.  He also has a history of atrial fibrillation with rapid ventricular rate.  Echocardiogram was done and a TEE.  The TEE demonstrated the EF to be well-preserved.  However moderately elevated pulmonary pressures.  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  His course was complicated by previous history of noncompliance.  He was just managed with rate control as he was not thought to be a candidate for long-term anticoagulation and cardioversion.  He was not thought to be a candidate because of his multiple comorbidities.  Palliative care consult was suggested rather than considering further treatment including percutaneous treatment of the mitral valve.  He comes today to his appointment.  He has not been drinking alcohol.  He is actually now living at [facility].  I think this is a permanent situation.  He is taking all of his medications.  He is actually back in a sinus bradycardia.  He does get short of breath with activities and gets around in a wheelchair if he is walking distances but he is slowly recovering.  He still frail and thin but he is not in any distress.  He used his wheelchair to get back to the room but was able to walk in a wheelchair into the room  and up on the table.  He is not describing any palpitations, presyncope or syncope.  He is not having any chest pressure, neck or arm discomfort.  He has had no weight gain or edema.   Past Medical History:  Diagnosis Date   Alcohol abuse    Atrial flutter (HCC)    Mitral regurgitation    Seizures (HCC)     Past Surgical History:  Procedure Laterality Date   TEE WITHOUT CARDIOVERSION N/A 02/25/2021   Procedure: TRANSESOPHAGEAL ECHOCARDIOGRAM (TEE);  Surgeon: Thurmon Fair, MD;  Location: [facility];  Service: Cardiovascular;  Laterality: N/A;     Current Outpatient Medications  Medication Sig Dispense Refill   albuterol (2.5 MG/3ML) 0.083% NEBU 3 mL, albuterol (5 MG/ML) 0.5% NEBU 0.5 mL Inhale 2.5 mg into the lungs every 6 (six) hours.     Albuterol Sulfate 2.5 MG/0.5ML NEBU Inhale into the lungs.     mirtazapine (REMERON) 15 MG tablet Take 15 mg by mouth at bedtime.     rivaroxaban (XARELTO) 20 MG TABS tablet Take 20 mg by mouth daily with supper.     sacubitril-valsartan (ENTRESTO) 24-26 MG Take 1 tablet by mouth 2 (two) times daily. 60 tablet 11   amiodarone (PACERONE) 200 MG tablet Take 1 tablet (200 mg total) by mouth 2 (two) times daily. 60 tablet 2   folic acid (FOLVITE) 1 MG tablet Take 1 tablet (1 mg total) by mouth  daily. 30 tablet 2   furosemide (LASIX) 40 MG tablet Take 1 tablet (40 mg total) by mouth 2 (two) times daily. 60 tablet 2   hydrocortisone (CORTEF) 10 MG tablet Take 10 mg by mouth 2 (two) times daily.     Multiple Vitamin (MULTIVITAMIN WITH MINERALS) TABS tablet Take 1 tablet by mouth daily. 30 tablet 2   pantoprazole (PROTONIX) 40 MG tablet Take 1 tablet (40 mg total) by mouth daily. 30 tablet 2   potassium chloride (KLOR-CON) 10 MEQ tablet Take 1 tablet (10 mEq total) by mouth 2 (two) times daily. 60 tablet 2   thiamine 100 MG tablet Take 1 tablet (100 mg total) by mouth daily. 30 tablet 2   No current facility-administered medications for this visit.     Allergies:   Patient has no known allergies.    ROS:  Please see the history of present illness.   Otherwise, review of systems are positive for none.   All other systems are reviewed and negative.    PHYSICAL EXAM: VS:  BP (!) 114/58    Pulse (!) 55    Ht 6\' 2"  (1.88 m)    Wt 140 lb 9.6 oz (63.8 kg)    SpO2 99%    BMI 18.05 kg/m  , BMI Body mass index is 18.05 kg/m. GENERAL:  Well appearing GENERAL:  Well appearing NECK:  No jugular venous distention, waveform within normal limits, carotid upstroke brisk and symmetric, no bruits, no thyromegaly LUNGS:  Clear to auscultation bilaterally CHEST:  Unremarkable HEART:  PMI not displaced or sustained,S1 and S2 within normal limits, no S3, no S4, no clicks, no rubs, 3 out of 6 holosystolic murmur heard at the apex and radiating to the axilla, no diastolic murmurs ABD:  Flat, positive bowel sounds normal in frequency in pitch, no bruits, no rebound, no guarding, no midline pulsatile mass, no hepatomegaly, no splenomegaly EXT:  2 plus pulses throughout, no edema, no cyanosis no clubbing    EKG:  EKG is ordered today. The ekg ordered today demonstrates sinus bradycardia, rate 55, axis within normal limits interventricular conduction delay borderline, left ventricular hypertrophy by voltage criteria   Recent Labs: 02/22/2021: TSH 7.133 03/06/2021: ALT 47 03/11/2021: Magnesium 2.1 03/14/2021: BUN 25; Creatinine, Ser 0.79; Hemoglobin 8.9; Platelets 204; Potassium 4.5; Sodium 137    Lipid Panel    Component Value Date/Time   CHOL 107 01/31/2020 0930   TRIG 29 01/31/2020 0930   HDL 43 01/31/2020 0930   CHOLHDL 2.5 01/31/2020 0930   VLDL 6 01/31/2020 0930   LDLCALC 58 01/31/2020 0930      Wt Readings from Last 3 Encounters:  07/15/21 140 lb 9.6 oz (63.8 kg)  03/08/21 164 lb 0.4 oz (74.4 kg)  02/01/20 135 lb 12.8 oz (61.6 kg)      Other studies Reviewed: Additional studies/ records that were reviewed today include: Hospital  records.   (Greater than 40 minutes reviewing all data with greater than 50% face to face with the patient). Review of the above records demonstrates:  Please see elsewhere in the note.     ASSESSMENT AND PLAN:   Toxic metabolic encephalopathy: He is off of alcohol and neurologically clear.   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   New onset A. fib with RVR: He is back in sinus rhythm.  I am going to make sure his amiodarone is only 200 mg.  When he comes  back he can have follow-up labs.  Subclinical hypothyroidism: We can follow his labs as an outpatient along with his primary providers.    Mitral regurgitation with prolapse, acute pulmonary edema with acute hypoxemic respiratory failure:  I sent a message today to be Structural Heart Team.  He needs to be on our radar for possible repair either surgically or percutaneously in the future.  If he maintains this social situation and stays off of alcohol I think he would be a candidate.  He and I had this conversation.  Today I am going to try to add Entresto 24-26 to his regimen.  PE: We can complete 6 months therapy and probably indefinitely given the arrhythmia and his large atrium and evidence of of acoustic shadowing suggesting poor flow.   Current medicines are reviewed at length with the patient today.  The patient does not have concerns regarding medicines.  The following changes have been made:  no change  Labs/ tests ordered today include:   Orders Placed This Encounter  Procedures   Basic Metabolic Panel (BMET)   EKG 12-Lead     Disposition:   FU with me or APP in six weeks.     Signed, Minus Breeding, MD  07/15/2021 11:41 AM    [facility]

## 2021-07-15 ENCOUNTER — Encounter: Payer: Self-pay | Admitting: Cardiology

## 2021-07-15 ENCOUNTER — Ambulatory Visit (INDEPENDENT_AMBULATORY_CARE_PROVIDER_SITE_OTHER): Payer: Medicare Other | Admitting: Cardiology

## 2021-07-15 ENCOUNTER — Other Ambulatory Visit: Payer: Self-pay

## 2021-07-15 VITALS — BP 114/58 | HR 55 | Ht 74.0 in | Wt 140.6 lb

## 2021-07-15 DIAGNOSIS — Z79899 Other long term (current) drug therapy: Secondary | ICD-10-CM

## 2021-07-15 DIAGNOSIS — I4819 Other persistent atrial fibrillation: Secondary | ICD-10-CM

## 2021-07-15 MED ORDER — SACUBITRIL-VALSARTAN 24-26 MG PO TABS: 1.0000 | ORAL_TABLET | Freq: Two times a day (BID) | ORAL | 11 refills | Status: DC

## 2021-07-15 NOTE — Patient Instructions (Addendum)
Medication Instructions:  TAKE amiodarone 200mg  once daily  START entresto 24/26mg  twice daily  *If you need a refill on your cardiac medications before your next appointment, please call your pharmacy*   Lab Work: BMET (non-fasting) in 2 weeks  If you have labs (blood work) drawn today and your tests are completely normal, you will receive your results only by: [place] (if you have MyChart) OR A paper copy in the mail If you have any lab test that is abnormal or we need to change your treatment, we will call you to review the results.   Testing/Procedures: NONE   Follow-Up: At [facility], you and your health needs are our priority.  As part of our continuing mission to provide you with exceptional heart care, we have created designated Provider Care Teams.  These Care Teams include your primary Cardiologist (physician) and Advanced Practice Providers (APPs -  Physician Assistants and Nurse Practitioners) who all work together to provide you with the care you need, when you need it.  We recommend signing up for the patient portal called "MyChart".  Sign up information is provided on this After Visit Summary.  MyChart is used to connect with patients for Virtual Visits (Telemedicine).  Patients are able to view lab/test results, encounter notes, upcoming appointments, etc.  Non-urgent messages can be sent to your provider as well.   To learn more about what you can do with MyChart, go to [email or web address].    Your next appointment:   6 week(s)  The format for your next appointment:   In Person  Provider:  H[place] MD or APP

## 2021-07-26 ENCOUNTER — Telehealth: Payer: Self-pay | Admitting: Cardiology

## 2021-07-26 NOTE — Telephone Encounter (Signed)
Spoke with Renna at [facility]. She stated they received a FAX for Dr. Carlena Bjornstad, but Dr. Carlena Bjornstad no longer works there. Patient is a resident at [facility]. I left a message for patient's nurse to call me back to learn who the attending is for the patient. That way, we can send Dr. Rosezella Florida notes to that PCP.

## 2021-07-26 NOTE — Telephone Encounter (Signed)
Follow Up:       Please call Renna from [facility], concerning the referral on this patient please.

## 2021-08-26 NOTE — Progress Notes (Unsigned)
Cardiology Office Note   Date:  08/26/2021   ID:  Roy Ryan, DOB 1951/01/11, MRN [id number]  PCP:  Patient, No Pcp Per (Inactive)  Cardiologist:   Nanetta Batty, MD   No chief complaint on file.     History of Present Illness:  Roy Ryan is a 71 y.o. male who presents for follow-up of atrial fibrillation.  The patient was last in the hospital in August of last year.  He as a history of pulmonary hypertension, alcohol use, poor living conditions.  He apparently had possible sepsis.  He had adrenal insufficiency.  When he was in the hospital he did have a diagnosis which demonstrated pulmonary embolism.  He was treated with IV heparin and then Xarelto.  He also has a history of atrial fibrillation with rapid ventricular rate.  Echocardiogram was done and a TEE.  The TEE demonstrated the EF to be well-preserved.  However moderately elevated pulmonary pressures.  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  His course was complicated by previous history of noncompliance.  He was just managed with rate control as he was not thought to be a candidate for long-term anticoagulation and cardioversion.  He was not thought to be a candidate because of his multiple comorbidities.  Palliative care consult was suggested rather than considering further treatment including percutaneous treatment of the mitral valve.  At the last visit he has not been drinking alcohol.  He is actually now living at [facility].  He was taking all of his medications.   He was in sinus when I saw him and I started Entresto.  ***      ****He is actually back in a sinus bradycardia.  He does get short of breath with activities and gets around in a wheelchair if he is walking distances but he is slowly recovering.  He still frail and thin but he is not in any distress.  He used his wheelchair to get back to the room but was able to walk in a wheelchair into the room and up on the table.   He is not describing any palpitations, presyncope or syncope.  He is not having any chest pressure, neck or arm discomfort.  He has had no weight gain or edema.   Past Medical History:  Diagnosis Date   Alcohol abuse    Atrial flutter (HCC)    Mitral regurgitation    Seizures (HCC)     Past Surgical History:  Procedure Laterality Date   TEE WITHOUT CARDIOVERSION N/A 02/25/2021   Procedure: TRANSESOPHAGEAL ECHOCARDIOGRAM (TEE);  Surgeon: Thurmon Fair, MD;  Location: [facility] ENDOSCOPY;  Service: Cardiovascular;  Laterality: N/A;     Current Outpatient Medications  Medication Sig Dispense Refill   albuterol (2.5 MG/3ML) 0.083% NEBU 3 mL, albuterol (5 MG/ML) 0.5% NEBU 0.5 mL Inhale 2.5 mg into the lungs every 6 (six) hours.     Albuterol Sulfate 2.5 MG/0.5ML NEBU Inhale into the lungs.     amiodarone (PACERONE) 200 MG tablet Take 1 tablet (200 mg total) by mouth 2 (two) times daily. 60 tablet 2   folic acid (FOLVITE) 1 MG tablet Take 1 tablet (1 mg total) by mouth daily. 30 tablet 2   furosemide (LASIX) 40 MG tablet Take 1 tablet (40 mg total) by mouth 2 (two) times daily. 60 tablet 2   hydrocortisone (CORTEF) 10 MG tablet Take 10 mg by mouth 2 (two) times daily.     mirtazapine (REMERON) 15  MG tablet Take 15 mg by mouth at bedtime.     Multiple Vitamin (MULTIVITAMIN WITH MINERALS) TABS tablet Take 1 tablet by mouth daily. 30 tablet 2   pantoprazole (PROTONIX) 40 MG tablet Take 1 tablet (40 mg total) by mouth daily. 30 tablet 2   potassium chloride (KLOR-CON) 10 MEQ tablet Take 1 tablet (10 mEq total) by mouth 2 (two) times daily. 60 tablet 2   rivaroxaban (XARELTO) 20 MG TABS tablet Take 20 mg by mouth daily with supper.     sacubitril-valsartan (ENTRESTO) 24-26 MG Take 1 tablet by mouth 2 (two) times daily. 60 tablet 11   thiamine 100 MG tablet Take 1 tablet (100 mg total) by mouth daily. 30 tablet 2   No current facility-administered medications for this visit.    Allergies:    Patient has no known allergies.    ROS:  Please see the history of present illness.   Otherwise, review of systems are positive for ***.   All other systems are reviewed and negative.    PHYSICAL EXAM: VS:  There were no vitals taken for this visit. , BMI There is no height or weight on file to calculate BMI. GENERAL:  Well appearing NECK:  No jugular venous distention, waveform within normal limits, carotid upstroke brisk and symmetric, no bruits, no thyromegaly LUNGS:  Clear to auscultation bilaterally CHEST:  Unremarkable HEART:  PMI not displaced or sustained,S1 and S2 within normal limits, no S3, no S4, no clicks, no rubs, *** murmurs ABD:  Flat, positive bowel sounds normal in frequency in pitch, no bruits, no rebound, no guarding, no midline pulsatile mass, no hepatomegaly, no splenomegaly EXT:  2 plus pulses throughout, no edema, no cyanosis no clubbing      ***GENERAL:  Well appearing GENERAL:  Well appearing NECK:  No jugular venous distention, waveform within normal limits, carotid upstroke brisk and symmetric, no bruits, no thyromegaly LUNGS:  Clear to auscultation bilaterally CHEST:  Unremarkable HEART:  PMI not displaced or sustained,S1 and S2 within normal limits, no S3, no S4, no clicks, no rubs, 3 out of 6 holosystolic murmur heard at the apex and radiating to the axilla, no diastolic murmurs ABD:  Flat, positive bowel sounds normal in frequency in pitch, no bruits, no rebound, no guarding, no midline pulsatile mass, no hepatomegaly, no splenomegaly EXT:  2 plus pulses throughout, no edema, no cyanosis no clubbing    EKG:  EKG is *** ordered today. The ekg ordered today demonstrates sinus bradycardia, rate ***, axis within normal limits interventricular conduction delay borderline, left ventricular hypertrophy by voltage criteria   Recent Labs: 02/22/2021: TSH 7.133 03/06/2021: ALT 47 03/11/2021: Magnesium 2.1 03/14/2021: BUN 25; Creatinine, Ser 0.79; Hemoglobin 8.9;  Platelets 204; Potassium 4.5; Sodium 137    Lipid Panel    Component Value Date/Time   CHOL 107 01/31/2020 0930   TRIG 29 01/31/2020 0930   HDL 43 01/31/2020 0930   CHOLHDL 2.5 01/31/2020 0930   VLDL 6 01/31/2020 0930   LDLCALC 58 01/31/2020 0930      Wt Readings from Last 3 Encounters:  07/15/21 140 lb 9.6 oz (63.8 kg)  03/08/21 164 lb 0.4 oz (74.4 kg)  02/01/20 135 lb 12.8 oz (61.6 kg)      Other studies Reviewed: Additional studies/ records that were reviewed today include: *** Review of the above records demonstrates:  Please see elsewhere in the note.     ASSESSMENT AND PLAN:   Toxic metabolic encephalopathy: ***  He  is off of alcohol and neurologically clear.   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   New onset A. fib with RVR: He is back in sinus rhythm.   *** I am going to make sure his amiodarone is only 200 mg.  When he comes back he can have follow-up labs.  Subclinical hypothyroidism: *** We can follow his labs as an outpatient along with his primary providers.    Mitral regurgitation with prolapse, acute pulmonary edema with acute hypoxemic respiratory failure:   ***  I sent a message today to be Structural Heart Team.  He needs to be on our radar for possible repair either surgically or percutaneously in the future.  If he maintains this social situation and stays off of alcohol I think he would be a candidate.  He and I had this conversation.  Today I am going to try to add Entresto 24-26 to his regimen.  PE: We can complete 6 months therapy and probably indefinitely given the arrhythmia and his large atrium and evidence of of acoustic shadowing suggesting poor flow.   Current medicines are reviewed at length with the patient today.  The patient does not have concerns regarding medicines.  The following changes have been made:  no change  Labs/ tests ordered today include:   No  orders of the defined types were placed in this encounter.    Disposition:   FU with me or APP in six weeks.     Signed, Rollene Rotunda, MD  08/26/2021 6:48 PM    [facility]

## 2021-08-27 ENCOUNTER — Ambulatory Visit: Payer: Medicare Other | Admitting: Cardiology

## 2021-08-27 DIAGNOSIS — I5020 Unspecified systolic (congestive) heart failure: Secondary | ICD-10-CM

## 2021-08-27 DIAGNOSIS — I340 Nonrheumatic mitral (valve) insufficiency: Secondary | ICD-10-CM

## 2021-08-31 ENCOUNTER — Emergency Department (HOSPITAL_COMMUNITY)
Admission: EM | Admit: 2021-08-31 | Discharge: 2021-09-01 | Disposition: A | Discharge status: Skilled Nursing Facility | Payer: Medicare Other | Attending: Emergency Medicine | Admitting: Emergency Medicine

## 2021-08-31 ENCOUNTER — Other Ambulatory Visit: Payer: Self-pay

## 2021-08-31 ENCOUNTER — Encounter (HOSPITAL_COMMUNITY): Payer: Self-pay

## 2021-08-31 ENCOUNTER — Emergency Department (HOSPITAL_COMMUNITY): Payer: Medicare Other

## 2021-08-31 DIAGNOSIS — R7401 Elevation of levels of liver transaminase levels: Secondary | ICD-10-CM | POA: Diagnosis not present

## 2021-08-31 DIAGNOSIS — Z5321 Procedure and treatment not carried out due to patient leaving prior to being seen by health care provider: Secondary | ICD-10-CM | POA: Insufficient documentation

## 2021-08-31 DIAGNOSIS — N179 Acute kidney failure, unspecified: Secondary | ICD-10-CM | POA: Diagnosis not present

## 2021-08-31 DIAGNOSIS — E860 Dehydration: Secondary | ICD-10-CM | POA: Diagnosis not present

## 2021-08-31 DIAGNOSIS — J439 Emphysema, unspecified: Secondary | ICD-10-CM | POA: Diagnosis not present

## 2021-08-31 DIAGNOSIS — Z5329 Procedure and treatment not carried out because of patient's decision for other reasons: Secondary | ICD-10-CM

## 2021-08-31 DIAGNOSIS — Z20822 Contact with and (suspected) exposure to covid-19: Secondary | ICD-10-CM | POA: Diagnosis not present

## 2021-08-31 DIAGNOSIS — I700 Atherosclerosis of aorta: Secondary | ICD-10-CM | POA: Diagnosis not present

## 2021-08-31 DIAGNOSIS — N289 Disorder of kidney and ureter, unspecified: Secondary | ICD-10-CM | POA: Diagnosis not present

## 2021-08-31 DIAGNOSIS — Z7901 Long term (current) use of anticoagulants: Secondary | ICD-10-CM | POA: Diagnosis not present

## 2021-08-31 DIAGNOSIS — Z66 Do not resuscitate: Secondary | ICD-10-CM | POA: Insufficient documentation

## 2021-08-31 DIAGNOSIS — I340 Nonrheumatic mitral (valve) insufficiency: Secondary | ICD-10-CM | POA: Insufficient documentation

## 2021-08-31 DIAGNOSIS — K828 Other specified diseases of gallbladder: Secondary | ICD-10-CM | POA: Diagnosis not present

## 2021-08-31 DIAGNOSIS — R109 Unspecified abdominal pain: Secondary | ICD-10-CM | POA: Diagnosis present

## 2021-08-31 LAB — CBC
HCT: 38.1 % — ABNORMAL LOW (ref 39.0–52.0)
Hemoglobin: 11.8 g/dL — ABNORMAL LOW (ref 13.0–17.0)
MCH: 28.1 pg (ref 26.0–34.0)
MCHC: 31 g/dL (ref 30.0–36.0)
MCV: 90.7 fL (ref 80.0–100.0)
Platelets: 173 10*3/uL (ref 150–400)
RBC: 4.2 MIL/uL — ABNORMAL LOW (ref 4.22–5.81)
RDW: 15 % (ref 11.5–15.5)
WBC: 5.1 10*3/uL (ref 4.0–10.5)
nRBC: 0 % (ref 0.0–0.2)

## 2021-08-31 LAB — RESP PANEL BY RT-PCR (FLU A&B, COVID) ARPGX2
Influenza A by PCR: NEGATIVE
Influenza B by PCR: NEGATIVE
SARS Coronavirus 2 by RT PCR: NEGATIVE

## 2021-08-31 LAB — BASIC METABOLIC PANEL
Anion gap: 11 (ref 5–15)
BUN: 43 mg/dL — ABNORMAL HIGH (ref 8–23)
CO2: 24 mmol/L (ref 22–32)
Calcium: 8.7 mg/dL — ABNORMAL LOW (ref 8.9–10.3)
Chloride: 101 mmol/L (ref 98–111)
Creatinine, Ser: 3.53 mg/dL — ABNORMAL HIGH (ref 0.61–1.24)
GFR, Estimated: 18 mL/min — ABNORMAL LOW (ref >60)
Glucose, Bld: 94 mg/dL (ref 70–99)
Potassium: 4.2 mmol/L (ref 3.5–5.1)
Sodium: 136 mmol/L (ref 135–145)

## 2021-08-31 LAB — HEPATIC FUNCTION PANEL
ALT: 153 U/L — ABNORMAL HIGH (ref 0–44)
AST: 215 U/L — ABNORMAL HIGH (ref 15–41)
Albumin: 3.3 g/dL — ABNORMAL LOW (ref 3.5–5.0)
Alkaline Phosphatase: 154 U/L — ABNORMAL HIGH (ref 38–126)
Bilirubin, Direct: 0.3 mg/dL — ABNORMAL HIGH (ref 0.0–0.2)
Indirect Bilirubin: 0.9 mg/dL (ref 0.3–0.9)
Total Bilirubin: 1.2 mg/dL (ref 0.3–1.2)
Total Protein: 7.4 g/dL (ref 6.5–8.1)

## 2021-08-31 LAB — URINALYSIS, ROUTINE W REFLEX MICROSCOPIC
Bilirubin Urine: NEGATIVE
Glucose, UA: NEGATIVE mg/dL
Hgb urine dipstick: NEGATIVE
Ketones, ur: NEGATIVE mg/dL
Leukocytes,Ua: NEGATIVE
Nitrite: NEGATIVE
Protein, ur: NEGATIVE mg/dL
Specific Gravity, Urine: 1.009 (ref 1.005–1.030)
pH: 5 (ref 5.0–8.0)

## 2021-08-31 LAB — BRAIN NATRIURETIC PEPTIDE: B Natriuretic Peptide: 86 pg/mL (ref 0.0–100.0)

## 2021-08-31 LAB — TROPONIN I (HIGH SENSITIVITY)
Troponin I (High Sensitivity): 15 ng/L (ref <18)
Troponin I (High Sensitivity): 13 ng/L (ref <18)

## 2021-08-31 LAB — LIPASE, BLOOD: Lipase: 32 U/L (ref 11–51)

## 2021-08-31 LAB — D-DIMER, QUANTITATIVE: D-Dimer, Quant: 2.05 ug/mL-FEU — ABNORMAL HIGH (ref 0.00–0.50)

## 2021-08-31 MED ORDER — SODIUM CHLORIDE 0.9 % IV BOLUS
1000.0000 mL | Freq: Once | INTRAVENOUS | Status: AC
  Administered 2021-08-31: 1000 mL via INTRAVENOUS

## 2021-08-31 MED ORDER — LACTATED RINGERS IV BOLUS
1000.0000 mL | Freq: Once | INTRAVENOUS | Status: AC
Start: 2021-08-31 — End: 2021-08-31
  Administered 2021-08-31: 1000 mL via INTRAVENOUS

## 2021-08-31 MED ORDER — LACTATED RINGERS IV BOLUS
500.0000 mL | Freq: Once | INTRAVENOUS | Status: AC
  Administered 2021-08-31: 500 mL via INTRAVENOUS

## 2021-08-31 NOTE — ED Notes (Signed)
[facility] notified of patient needing transportation back to [facility].

## 2021-08-31 NOTE — ED Triage Notes (Signed)
Patient complains of abd pain and chest pain all day.  With ems patient was mildly hypotensive.  Denies SOB.

## 2021-08-31 NOTE — Discharge Instructions (Addendum)
It was a pleasure caring for you today in the emergency department.  If you change your mind and would like to complete your care and be admitted to the hospital please return immediately or call [phone]  Please drink lots of fluids over the next few days.  Please call your primary doctor first thing tomorrow to be seen in the office.  Please return to the emergency department for any worsening or worrisome symptoms.

## 2021-08-31 NOTE — ED Provider Notes (Signed)
[facility] EMERGENCY DEPARTMENT Provider Note   CSN: [id number] Arrival date & time: 08/31/21  1756     History  Chief Complaint  Patient presents with   Chest Pain    Roy Ryan is a 71 y.o. male.  This is a 71 y.o. male  with significant medical history as below, including atrial flutter, mitral regurg, alcohol abuse who presents to the ED with complaint of abdominal pain.  Chest pain.  Onset of symptoms yesterday, gradually worsening the past 24 hours.  Specifically in his abdomen.  He is no longer having chest pain.  Poor appetite last few days.  No change in bowel or bladder function.  No recent medication changes.  No recent alcohol use.  Patient initially hypotensive in route by EMS, was given half liter bolus and blood pressure improved.  Past Medical History: No date: Alcohol abuse No date: Atrial flutter (HCC) No date: Mitral regurgitation No date: Seizures [facility]  Past Surgical History: 02/25/2021: TEE WITHOUT CARDIOVERSION; N/A     Comment:  Procedure: TRANSESOPHAGEAL ECHOCARDIOGRAM (TEE);                Surgeon: Sanda Klein, MD;  Location: [facility] ENDOSCOPY;                Service: Cardiovascular;  Laterality: N/A;    The history is provided by the patient. No language interpreter was used.  Chest Pain Associated symptoms: abdominal pain and fatigue   Associated symptoms: no cough, no dysphagia, no fever, no headache, no nausea, no palpitations, no shortness of breath and no vomiting       Home Medications Prior to Admission medications   Medication Sig Start Date End Date Taking? Authorizing Provider  albuterol (2.5 MG/3ML) 0.083% NEBU 3 mL, albuterol (5 MG/ML) 0.5% NEBU 0.5 mL Inhale 2.5 mg into the lungs every 6 (six) hours.    [provider]  Albuterol Sulfate 2.5 MG/0.5ML NEBU Inhale into the lungs.    [provider]  amiodarone (PACERONE) 200 MG tablet Take 1 tablet (200 mg total) by mouth 2 (two) times daily. 03/11/21 06/09/21  Dessa Phi, DO  folic acid (FOLVITE) 1 MG tablet Take 1 tablet (1 mg total) by mouth daily. 03/12/21   Dessa Phi, DO  furosemide (LASIX) 40 MG tablet Take 1 tablet (40 mg total) by mouth 2 (two) times daily. 03/11/21 06/09/21  Dessa Phi, DO  hydrocortisone (CORTEF) 10 MG tablet Take 10 mg by mouth 2 (two) times daily. 07/13/21   [provider]  mirtazapine (REMERON) 15 MG tablet Take 15 mg by mouth at bedtime.    [provider]  Multiple Vitamin (MULTIVITAMIN WITH MINERALS) TABS tablet Take 1 tablet by mouth daily. 03/12/21   Dessa Phi, DO  pantoprazole (PROTONIX) 40 MG tablet Take 1 tablet (40 mg total) by mouth daily. 03/12/21 06/10/21  Dessa Phi, DO  potassium chloride (KLOR-CON) 10 MEQ tablet Take 1 tablet (10 mEq total) by mouth 2 (two) times daily. 03/11/21   Dessa Phi, DO  rivaroxaban (XARELTO) 20 MG TABS tablet Take 20 mg by mouth daily with supper.    [provider]  sacubitril-valsartan (ENTRESTO) 24-26 MG Take 1 tablet by mouth 2 (two) times daily. 07/15/21   Minus Breeding, MD  thiamine 100 MG tablet Take 1 tablet (100 mg total) by mouth daily. 03/12/21   Dessa Phi, DO      Allergies    Patient has no known allergies.    Review of Systems  Review of Systems  Constitutional:  Positive for appetite change and fatigue. Negative for chills and fever.  HENT:  Negative for facial swelling and trouble swallowing.   Eyes:  Negative for photophobia and visual disturbance.  Respiratory:  Negative for cough and shortness of breath.   Cardiovascular:  Positive for chest pain. Negative for palpitations.  Gastrointestinal:  Positive for abdominal pain. Negative for nausea and vomiting.  Endocrine: Negative for polydipsia and polyuria.  Genitourinary:  Negative for difficulty urinating and hematuria.  Musculoskeletal:  Negative for gait problem and joint swelling.  Skin:  Negative for pallor and rash.  Neurological:  Negative for syncope and  headaches.  Psychiatric/Behavioral:  Negative for agitation and confusion.    Physical Exam Updated Vital Signs BP (!) 119/54    Pulse (!) 52    Temp 98.4 F (36.9 C) (Oral)    Resp 18    Ht 6\' 2"  (1.88 m)    Wt 62.1 kg    SpO2 97%    BMI 17.59 kg/m  Physical Exam Vitals and nursing note reviewed.  Constitutional:      Appearance: He is well-developed. He is not toxic-appearing or diaphoretic.     Comments: Frail-appearing  HENT:     Head: Normocephalic and atraumatic.     Right Ear: External ear normal.     Left Ear: External ear normal.     Mouth/Throat:     Mouth: Mucous membranes are moist.  Eyes:     General: No scleral icterus. Cardiovascular:     Rate and Rhythm: Normal rate and regular rhythm.     Pulses: Normal pulses.     Heart sounds: Normal heart sounds.  Pulmonary:     Effort: Pulmonary effort is normal. No respiratory distress.     Breath sounds: Normal breath sounds. No decreased breath sounds.  Abdominal:     General: Abdomen is flat.     Palpations: Abdomen is soft.     Tenderness: There is no abdominal tenderness. There is no guarding or rebound.  Musculoskeletal:        General: Normal range of motion.     Cervical back: Normal range of motion.     Right lower leg: No edema.     Left lower leg: No edema.  Skin:    General: Skin is warm and dry.     Capillary Refill: Capillary refill takes less than 2 seconds.  Neurological:     Mental Status: He is alert and oriented to person, place, and time.     GCS: GCS eye subscore is 4. GCS verbal subscore is 5. GCS motor subscore is 6.     Cranial Nerves: Cranial nerves 2-12 are intact.     Sensory: Sensation is intact.     Motor: Motor function is intact.     Coordination: Coordination is intact.  Psychiatric:        Mood and Affect: Mood normal.        Behavior: Behavior normal.    ED Results / Procedures / Treatments   Labs (all labs ordered are listed, but only abnormal results are displayed) Labs  Reviewed  BASIC METABOLIC PANEL - Abnormal; Notable for the following components:      Result Value   BUN 43 (*)    Creatinine, Ser 3.53 (*)    Calcium 8.7 (*)    GFR, Estimated 18 (*)    All other components within normal limits  CBC - Abnormal; Notable for the following  components:   RBC 4.20 (*)    Hemoglobin 11.8 (*)    HCT 38.1 (*)    All other components within normal limits  HEPATIC FUNCTION PANEL - Abnormal; Notable for the following components:   Albumin 3.3 (*)    AST 215 (*)    ALT 153 (*)    Alkaline Phosphatase 154 (*)    Bilirubin, Direct 0.3 (*)    All other components within normal limits  D-DIMER, QUANTITATIVE - Abnormal; Notable for the following components:   D-Dimer, Quant 2.05 (*)    All other components within normal limits  RESP PANEL BY RT-PCR (FLU A&B, COVID) ARPGX2  LIPASE, BLOOD  BRAIN NATRIURETIC PEPTIDE  URINALYSIS, ROUTINE W REFLEX MICROSCOPIC  TROPONIN I (HIGH SENSITIVITY)  TROPONIN I (HIGH SENSITIVITY)    EKG EKG Interpretation  Date/Time:  Tuesday August 31 2021 18:24:46 EST Ventricular Rate:  60 PR Interval:  258 QRS Duration: 130 QT Interval:  597 QTC Calculation: 597 R Axis:   -64 Text Interpretation: Sinus or ectopic atrial rhythm Prolonged PR interval Nonspecific IVCD with LAD Baseline wander in lead(s) V5 V6 no stemi Confirmed by Wynona Dove ([id number]) on 09/01/2021 12:20:02 AM  Radiology DG Chest 2 View  Result Date: 08/31/2021 CLINICAL DATA:  cp abd EXAM: CHEST - 2 VIEW COMPARISON:  Chest x-ray 03/01/2021, CT chest 03/12/2021 FINDINGS: The heart and mediastinal contours are within normal limits. No focal consolidation. No pulmonary edema. No pleural effusion. No pneumothorax. No acute osseous abnormality. Chronic multiple upper to midthoracic spine compression fractures. IMPRESSION: No active cardiopulmonary disease. Electronically Signed   By: Iven Finn M.D.   On: 08/31/2021 19:17   CT CHEST ABDOMEN PELVIS WO  CONTRAST  Result Date: 08/31/2021 CLINICAL DATA:  Chest abdominal pain, loss of appetite for 3 days EXAM: CT CHEST, ABDOMEN AND PELVIS WITHOUT CONTRAST TECHNIQUE: Multidetector CT imaging of the chest, abdomen and pelvis was performed following the standard protocol without IV contrast. RADIATION DOSE REDUCTION: This exam was performed according to the departmental dose-optimization program which includes automated exposure control, adjustment of the mA and/or kV according to patient size and/or use of iterative reconstruction technique. COMPARISON:  08/31/2021, 03/12/2021 FINDINGS: CT CHEST FINDINGS Cardiovascular: Limited unenhanced imaging of the heart and great vessels demonstrates no pericardial effusion. Stable left atrial dilation. Normal caliber of the thoracic aorta. Evaluation of the vascular lumen is limited without IV contrast. Mild atherosclerosis of the aortic arch and coronary vasculature. Mediastinum/Nodes: No enlarged mediastinal, hilar, or axillary lymph nodes. Thyroid gland, trachea, and esophagus demonstrate no significant findings. Lungs/Pleura: Chronic biapical scarring, greatest in the right upper lobe. Mild upper lobe predominant emphysema. Dependent hypoventilatory changes are seen within the lower lobes. No effusion or pneumothorax. Central airways are patent. Musculoskeletal: No acute or destructive bony lesions. Chronic T8 wedge compression deformity. Reconstructed images demonstrate no additional findings. CT ABDOMEN PELVIS FINDINGS Hepatobiliary: High attenuation material layering dependently within the gallbladder consistent with sludge. No calcified gallstones or gallbladder wall thickening. Unremarkable unenhanced appearance of the liver. Pancreas: Unremarkable unenhanced appearance. Spleen: Unremarkable unenhanced appearance. Adrenals/Urinary Tract: No urinary tract calculi or obstructive uropathy within either kidney. The adrenals are unremarkable. Bladder is decompressed,  limiting its evaluation. Stomach/Bowel: No bowel obstruction or ileus. Normal appendix right lower quadrant. No bowel wall thickening or inflammatory change. Vascular/Lymphatic: Mild aortic atherosclerosis. No pathologic adenopathy. Rounded calcified structure within the left mid abdominal mesentery may reflect a calcified lymph node or calcified epiploic appendage. Reproductive: Prostate is unremarkable. Evaluation limited by  streak artifact from left hip arthroplasty. Other: No free fluid or free gas.  No abdominal wall hernia. Musculoskeletal: Unremarkable left hip arthroplasty. Prior healed left iliac crest fracture. No acute or destructive bony lesions. Reconstructed images demonstrate chronic appearing invagination of the superior and inferior endplates at L4 and L5. IMPRESSION: 1. Emphysema with scattered areas of bilateral scarring, greatest at the right apex. No acute airspace disease. 2. Gallbladder sludge, without evidence of cholelithiasis or cholecystitis. 3.  Aortic Atherosclerosis (ICD10-I70.0). Electronically Signed   By: Randa Ngo M.D.   On: 08/31/2021 20:35    Procedures .Critical Care Performed by: Jeanell Sparrow, DO Authorized by: Jeanell Sparrow, DO   Critical care provider statement:    Critical care time (minutes):  42   Critical care was necessary to treat or prevent imminent or life-threatening deterioration of the following conditions:  Dehydration   Critical care was time spent personally by me on the following activities:  Development of treatment plan with patient or surrogate, discussions with consultants, evaluation of patient's response to treatment, examination of patient, ordering and review of laboratory studies, ordering and review of radiographic studies, ordering and performing treatments and interventions, pulse oximetry, re-evaluation of patient's condition, review of old charts and obtaining history from patient or surrogate    Medications Ordered in  ED Medications  sodium chloride 0.9 % bolus 1,000 mL (0 mLs Intravenous Stopped 08/31/21 2112)  lactated ringers bolus 1,000 mL (0 mLs Intravenous Stopped 08/31/21 2222)  lactated ringers bolus 500 mL (0 mLs Intravenous Stopped 08/31/21 2312)    ED Course/ Medical Decision Making/ A&P                           Medical Decision Making Amount and/or Complexity of Data Reviewed External Data Reviewed: labs, radiology and notes. Labs: ordered. Decision-making details documented in ED Course. Radiology: ordered. Decision-making details documented in ED Course. ECG/medicine tests: ordered and independent interpretation performed. Decision-making details documented in ED Course.  Risk Decision regarding hospitalization.    CC: Abdominal pain, chest pain, fatigue  This patient presents to the Emergency Department for the above complaint. This involves an extensive number of treatment options and is a complaint that carries with it a high risk of complications and morbidity. Vital signs were reviewed. Serious etiologies considered.  Record review:  Previous records obtained and reviewed   Additional history obtained from EMS  Medical and surgical history as noted above.   Work up as above, notable for:  Labs & imaging results that were available during my care of the patient were visualized by me and considered in my medical decision making.   I ordered imaging studies which included x-ray, CT chest abdomen pelvis.  And I visualized the imaging and I agree with radiologist interpretation.  Emphysema, biliary sludge.  Cardiac monitoring reviewed and interpreted personally which shows NSR  Abs reviewed concerning for renal insufficiency creatinine increased from baseline, today is 3.53.  CT does not demonstrate obstructive uropathy.  Patient with poor p.o. last few days, favor prerenal.  Urinalysis was nonacute. Also shown to have transaminitis, unclear etiology; may be secondary to renal  insufficiency/dehydration. He has hx of chronic etoh abuse. No recent ETOH per pt, does not appear intoxicated.   Social determinants of health include - N/a  Management: Patient given IV fluids  Reassessment:  Reassessed patient.  Advised him upon laboratory findings and imaging findings.  Verbalized my recommendation for admission.  Patient reports that he does not want to be admitted to the hospital and is ready go home.  I discussed the patient that if he goes home he may die or experience permanent disability, he reports that he is okay with this and is understanding. Says that if "it is my time to go then it is my time to go." I spoke with patient at length about recommendations for admission however he is adamant that he is ready to go back home and under no circumstances will he stay in the hospital tonight.  Pt is DNR, wants to go back to facility.   The patient has requested to leave the ED against medical advice. I believe this patient is of sound mind and medical decision making capacity to refuse medical care. The patient is responding and asking questions appropriately. The patient is oriented to person, place and time. The patient is not psychotic, delusional, suicidal, homicidal or hallucinating. The patient demonstrates a normal mental capacity to make decisions regarding their healthcare. The patient is clinically sober and does not appear to be under the influence of any illicit drugs at this time. The patient has been advised of the risks, in layman terms, of leaving AMA which include, but are not limited to death, coma, permanent disability, loss of current lifestyle, delay in diagnosis. Alternatives have been offered - the patient remains steadfast in their wish to leave. The patient has been advised that should they change their mind they are welcome to return to this hospital, or any other, at any time. The patient understands that in no way does an [place] discharge mean that I do not  want them to have the best medical care available. To this end, I have offered appropriate prescriptions, referrals, and discharge instructions. The patient did sign AMA paperwork. The above discussion was witnessed by another member of staff.        This chart was dictated using voice recognition software.  Despite best efforts to proofread,  errors can occur which can change the documentation meaning.         Final Clinical Impression(s) / ED Diagnoses Final diagnoses:  Renal insufficiency  AKI (acute kidney injury) ([place])  Left against medical advice  Transaminitis  Dehydration    Rx / DC Orders ED Discharge Orders     None         Jeanell Sparrow, DO 09/01/21 0021

## 2021-09-07 ENCOUNTER — Other Ambulatory Visit: Payer: Self-pay

## 2021-09-07 ENCOUNTER — Observation Stay (HOSPITAL_COMMUNITY): Payer: Medicare Other

## 2021-09-07 ENCOUNTER — Encounter (HOSPITAL_COMMUNITY): Payer: Self-pay | Admitting: Pharmacy Technician

## 2021-09-07 ENCOUNTER — Observation Stay (HOSPITAL_COMMUNITY)
Admission: EM | Admit: 2021-09-07 | Discharge: 2021-09-08 | Disposition: A | Discharge status: Home / Self Care | Payer: Medicare Other | Attending: Family Medicine | Admitting: Family Medicine

## 2021-09-07 DIAGNOSIS — N179 Acute kidney failure, unspecified: Secondary | ICD-10-CM | POA: Diagnosis not present

## 2021-09-07 DIAGNOSIS — I4891 Unspecified atrial fibrillation: Secondary | ICD-10-CM | POA: Diagnosis present

## 2021-09-07 DIAGNOSIS — I5020 Unspecified systolic (congestive) heart failure: Secondary | ICD-10-CM | POA: Diagnosis present

## 2021-09-07 DIAGNOSIS — E2740 Unspecified adrenocortical insufficiency: Secondary | ICD-10-CM | POA: Diagnosis present

## 2021-09-07 DIAGNOSIS — I340 Nonrheumatic mitral (valve) insufficiency: Secondary | ICD-10-CM | POA: Diagnosis present

## 2021-09-07 DIAGNOSIS — R627 Adult failure to thrive: Secondary | ICD-10-CM | POA: Diagnosis not present

## 2021-09-07 DIAGNOSIS — Z79899 Other long term (current) drug therapy: Secondary | ICD-10-CM | POA: Insufficient documentation

## 2021-09-07 DIAGNOSIS — Z7901 Long term (current) use of anticoagulants: Secondary | ICD-10-CM | POA: Insufficient documentation

## 2021-09-07 DIAGNOSIS — E43 Unspecified severe protein-calorie malnutrition: Secondary | ICD-10-CM | POA: Diagnosis present

## 2021-09-07 DIAGNOSIS — I9589 Other hypotension: Secondary | ICD-10-CM

## 2021-09-07 DIAGNOSIS — I959 Hypotension, unspecified: Secondary | ICD-10-CM | POA: Diagnosis present

## 2021-09-07 DIAGNOSIS — K219 Gastro-esophageal reflux disease without esophagitis: Secondary | ICD-10-CM | POA: Diagnosis present

## 2021-09-07 LAB — CBC WITH DIFFERENTIAL/PLATELET
Abs Immature Granulocytes: 0 10*3/uL (ref 0.00–0.07)
Basophils Absolute: 0 10*3/uL (ref 0.0–0.1)
Basophils Relative: 1 %
Eosinophils Absolute: 0.3 10*3/uL (ref 0.0–0.5)
Eosinophils Relative: 9 %
HCT: 33.4 % — ABNORMAL LOW (ref 39.0–52.0)
Hemoglobin: 10.6 g/dL — ABNORMAL LOW (ref 13.0–17.0)
Immature Granulocytes: 0 %
Lymphocytes Relative: 22 %
Lymphs Abs: 0.8 10*3/uL (ref 0.7–4.0)
MCH: 29 pg (ref 26.0–34.0)
MCHC: 31.7 g/dL (ref 30.0–36.0)
MCV: 91.5 fL (ref 80.0–100.0)
Monocytes Absolute: 0.5 10*3/uL (ref 0.1–1.0)
Monocytes Relative: 16 %
Neutro Abs: 1.8 10*3/uL (ref 1.7–7.7)
Neutrophils Relative %: 52 %
Platelets: 194 10*3/uL (ref 150–400)
RBC: 3.65 MIL/uL — ABNORMAL LOW (ref 4.22–5.81)
RDW: 14.9 % (ref 11.5–15.5)
WBC: 3.5 10*3/uL — ABNORMAL LOW (ref 4.0–10.5)
nRBC: 0 % (ref 0.0–0.2)

## 2021-09-07 LAB — COMPREHENSIVE METABOLIC PANEL WITH GFR
ALT: 92 U/L — ABNORMAL HIGH (ref 0–44)
AST: 76 U/L — ABNORMAL HIGH (ref 15–41)
Albumin: 3.2 g/dL — ABNORMAL LOW (ref 3.5–5.0)
Alkaline Phosphatase: 138 U/L — ABNORMAL HIGH (ref 38–126)
Anion gap: 8 (ref 5–15)
BUN: 33 mg/dL — ABNORMAL HIGH (ref 8–23)
CO2: 26 mmol/L (ref 22–32)
Calcium: 8.9 mg/dL (ref 8.9–10.3)
Chloride: 103 mmol/L (ref 98–111)
Creatinine, Ser: 2.59 mg/dL — ABNORMAL HIGH (ref 0.61–1.24)
GFR, Estimated: 26 mL/min — ABNORMAL LOW
Glucose, Bld: 110 mg/dL — ABNORMAL HIGH (ref 70–99)
Potassium: 4.2 mmol/L (ref 3.5–5.1)
Sodium: 137 mmol/L (ref 135–145)
Total Bilirubin: 0.6 mg/dL (ref 0.3–1.2)
Total Protein: 6.9 g/dL (ref 6.5–8.1)

## 2021-09-07 LAB — PHOSPHORUS: Phosphorus: 3.1 mg/dL (ref 2.5–4.6)

## 2021-09-07 LAB — MAGNESIUM: Magnesium: 1.8 mg/dL (ref 1.7–2.4)

## 2021-09-07 MED ORDER — ENSURE ENLIVE PO LIQD
237.0000 mL | Freq: Two times a day (BID) | ORAL | Status: DC
  Administered 2021-09-08 (×2): 237 mL via ORAL

## 2021-09-07 MED ORDER — ONDANSETRON HCL 4 MG PO TABS: 4.0000 mg | ORAL_TABLET | Freq: Four times a day (QID) | ORAL | Status: DC | PRN

## 2021-09-07 MED ORDER — THIAMINE HCL 100 MG PO TABS
100.0000 mg | ORAL_TABLET | Freq: Every day | ORAL | Status: DC
  Administered 2021-09-07 – 2021-09-08 (×2): 100 mg via ORAL
  Filled 2021-09-07 (×2): qty 1

## 2021-09-07 MED ORDER — PANTOPRAZOLE SODIUM 40 MG PO TBEC
40.0000 mg | DELAYED_RELEASE_TABLET | Freq: Every day | ORAL | Status: DC
  Administered 2021-09-07 – 2021-09-08 (×2): 40 mg via ORAL
  Filled 2021-09-07 (×2): qty 1

## 2021-09-07 MED ORDER — ACETAMINOPHEN 650 MG RE SUPP: 650.0000 mg | Freq: Four times a day (QID) | RECTAL | Status: DC | PRN

## 2021-09-07 MED ORDER — ADULT MULTIVITAMIN W/MINERALS CH
1.0000 | ORAL_TABLET | Freq: Every day | ORAL | Status: DC
  Administered 2021-09-07 – 2021-09-08 (×2): 1 via ORAL
  Filled 2021-09-07 (×2): qty 1

## 2021-09-07 MED ORDER — FOLIC ACID 1 MG PO TABS
1.0000 mg | ORAL_TABLET | Freq: Every day | ORAL | Status: DC
  Administered 2021-09-07 – 2021-09-08 (×2): 1 mg via ORAL
  Filled 2021-09-07 (×2): qty 1

## 2021-09-07 MED ORDER — MIRTAZAPINE 15 MG PO TABS
15.0000 mg | ORAL_TABLET | Freq: Every day | ORAL | Status: DC
  Administered 2021-09-07: 15 mg via ORAL
  Filled 2021-09-07: qty 1

## 2021-09-07 MED ORDER — ONDANSETRON HCL 4 MG/2ML IJ SOLN: 4.0000 mg | Freq: Four times a day (QID) | INTRAMUSCULAR | Status: DC | PRN

## 2021-09-07 MED ORDER — SODIUM CHLORIDE 0.9 % IV BOLUS
1000.0000 mL | Freq: Once | INTRAVENOUS | Status: AC
Start: 2021-09-07 — End: 2021-09-07
  Administered 2021-09-07: 1000 mL via INTRAVENOUS

## 2021-09-07 MED ORDER — HYDROCORTISONE 10 MG PO TABS
10.0000 mg | ORAL_TABLET | Freq: Two times a day (BID) | ORAL | Status: DC
  Administered 2021-09-07 – 2021-09-08 (×2): 10 mg via ORAL
  Filled 2021-09-07 (×6): qty 1

## 2021-09-07 MED ORDER — ACETAMINOPHEN 325 MG PO TABS: 650.0000 mg | ORAL_TABLET | Freq: Four times a day (QID) | ORAL | Status: DC | PRN

## 2021-09-07 MED ORDER — AMIODARONE HCL 200 MG PO TABS
200.0000 mg | ORAL_TABLET | Freq: Every day | ORAL | Status: DC
Start: 2021-09-08 — End: 2021-09-08
  Administered 2021-09-08: 200 mg via ORAL
  Filled 2021-09-07: qty 1

## 2021-09-07 MED ORDER — SODIUM CHLORIDE 0.9 % IV SOLN: INTRAVENOUS | Status: AC

## 2021-09-07 MED ORDER — HEPARIN SODIUM (PORCINE) 5000 UNIT/ML IJ SOLN
5000.0000 [IU] | Freq: Three times a day (TID) | INTRAMUSCULAR | Status: DC
  Administered 2021-09-07 – 2021-09-08 (×3): 5000 [IU] via SUBCUTANEOUS
  Filled 2021-09-07 (×4): qty 1

## 2021-09-07 MED ORDER — SODIUM CHLORIDE 0.9 % IV SOLN: INTRAVENOUS | Status: DC

## 2021-09-07 NOTE — Assessment & Plan Note (Signed)
-   Appears to be in the setting of nephrotoxic agents ?-Gentle fluid resuscitation will be provided, will check renal ultrasound and hold Lasix and Entresto. ?-Nephrology service recommended to be reconsulted in the morning if renal function fails to improve; otherwise he can be followed as an outpatient. ?

## 2021-09-07 NOTE — ED Provider Notes (Signed)
?[facility] EMERGENCY DEPARTMENT ?Provider Note ? ? ?CSN: [id number] ?Arrival date & time: 09/07/21  1016 ? ?  ? ?History ? ?Chief Complaint  ?Patient presents with  ? Abnormal Lab  ? ? ?Roy Ryan is a 71 y.o. male. ? ?Patient with a history of heart failure and EtOH abuse.  Patient was sent over to the emergency department for worsening kidney function ? ?The history is provided by the patient and medical records. No language interpreter was used.  ?Weakness ?Severity:  Moderate ?Onset quality:  Sudden ?Timing:  Constant ?Progression:  Waxing and waning ?Chronicity:  New ?Context: not allergies   ?Relieved by:  Nothing ?Worsened by:  Nothing ?Ineffective treatments:  None tried ?Associated symptoms: no abdominal pain, no chest pain, no cough, no diarrhea, no frequency, no headaches and no seizures   ? ?  ? ?Home Medications ?Prior to Admission medications   ?Medication Sig Start Date End Date Taking? Authorizing Provider  ?amiodarone (PACERONE) 200 MG tablet Take 1 tablet (200 mg total) by mouth 2 (two) times daily. 03/11/21 09/07/21 Yes Noralee Stain, DO  ?folic acid (FOLVITE) 1 MG tablet Take 1 tablet (1 mg total) by mouth daily. 03/12/21  Yes Noralee Stain, DO  ?furosemide (LASIX) 40 MG tablet Take 1 tablet (40 mg total) by mouth 2 (two) times daily. 03/11/21 09/07/21 Yes Noralee Stain, DO  ?hydrocortisone (CORTEF) 10 MG tablet Take 10 mg by mouth 2 (two) times daily. 07/13/21  Yes [provider]  ?mirtazapine (REMERON) 15 MG tablet Take 15 mg by mouth at bedtime.   Yes [provider]  ?Multiple Vitamin (MULTIVITAMIN WITH MINERALS) TABS tablet Take 1 tablet by mouth daily. 03/12/21  Yes Noralee Stain, DO  ?potassium chloride (KLOR-CON) 10 MEQ tablet Take 1 tablet (10 mEq total) by mouth 2 (two) times daily. 03/11/21  Yes Noralee Stain, DO  ?rivaroxaban (XARELTO) 20 MG TABS tablet Take 20 mg by mouth daily with supper.   Yes [provider]  ?sacubitril-valsartan (ENTRESTO) 24-26 MG Take 1  tablet by mouth 2 (two) times daily. 07/15/21  Yes Rollene Rotunda, MD  ?thiamine 100 MG tablet Take 1 tablet (100 mg total) by mouth daily. 03/12/21  Yes Noralee Stain, DO  ?pantoprazole (PROTONIX) 40 MG tablet Take 1 tablet (40 mg total) by mouth daily. ?Patient not taking: Reported on 09/07/2021 03/12/21 06/10/21  Noralee Stain, DO  ?   ? ?Allergies    ?Patient has no known allergies.   ? ?Review of Systems   ?Review of Systems  ?Constitutional:  Negative for appetite change and fatigue.  ?HENT:  Negative for congestion, ear discharge and sinus pressure.   ?Eyes:  Negative for discharge.  ?Respiratory:  Negative for cough.   ?Cardiovascular:  Negative for chest pain.  ?Gastrointestinal:  Negative for abdominal pain and diarrhea.  ?Genitourinary:  Negative for frequency and hematuria.  ?Musculoskeletal:  Negative for back pain.  ?Skin:  Negative for rash.  ?Neurological:  Positive for weakness. Negative for seizures and headaches.  ?Psychiatric/Behavioral:  Negative for hallucinations.   ? ?Physical Exam ?Updated Vital Signs ?BP (!) 102/55   Pulse (!) 52   Temp 97.9 ?F (36.6 ?C) (Oral)   Resp (!) 21   Ht 6\' 2"  (1.88 m)   Wt 62.6 kg   SpO2 98%   BMI 17.72 kg/m?  ?Physical Exam ?Vitals and nursing note reviewed.  ?Constitutional:   ?   Appearance: He is well-developed.  ?HENT:  ?   Head: Normocephalic.  ?  Nose: Nose normal.  ?Eyes:  ?   General: No scleral icterus. ?   Conjunctiva/sclera: Conjunctivae normal.  ?Neck:  ?   Thyroid: No thyromegaly.  ?Cardiovascular:  ?   Rate and Rhythm: Normal rate and regular rhythm.  ?   Heart sounds: No murmur heard. ?  No friction rub. No gallop.  ?Pulmonary:  ?   Breath sounds: No stridor. No wheezing or rales.  ?Chest:  ?   Chest wall: No tenderness.  ?Abdominal:  ?   General: There is no distension.  ?   Tenderness: There is no abdominal tenderness. There is no rebound.  ?Musculoskeletal:     ?   General: Normal range of motion.  ?   Cervical back: Neck supple.   ?Lymphadenopathy:  ?   Cervical: No cervical adenopathy.  ?Skin: ?   Findings: No erythema or rash.  ?Neurological:  ?   Mental Status: He is alert and oriented to person, place, and time.  ?   Motor: No abnormal muscle tone.  ?   Coordination: Coordination normal.  ?Psychiatric:     ?   Behavior: Behavior normal.  ? ? ?ED Results / Procedures / Treatments   ?Labs ?(all labs ordered are listed, but only abnormal results are displayed) ?Labs Reviewed  ?CBC WITH DIFFERENTIAL/PLATELET - Abnormal; Notable for the following components:  ?    Result Value  ? WBC 3.5 (*)   ? RBC 3.65 (*)   ? Hemoglobin 10.6 (*)   ? HCT 33.4 (*)   ? All other components within normal limits  ?COMPREHENSIVE METABOLIC PANEL - Abnormal; Notable for the following components:  ? Glucose, Bld 110 (*)   ? BUN 33 (*)   ? Creatinine, Ser 2.59 (*)   ? Albumin 3.2 (*)   ? AST 76 (*)   ? ALT 92 (*)   ? Alkaline Phosphatase 138 (*)   ? GFR, Estimated 26 (*)   ? All other components within normal limits  ? ? ?EKG ?None ? ?Radiology ?No results found. ? ?Procedures ?Procedures  ? ? ?Medications Ordered in ED ?Medications  ?sodium chloride 0.9 % bolus 1,000 mL (1,000 mLs Intravenous New Bag/Given 09/07/21 1142)  ? ? ?ED Course/ Medical Decision Making/ A&P ?Patient with dehydration and worsening kidney function.  I spoke with nephrology and they want him admitted to medicine with hydration and if that does not improve his kidney function they want to be consulted tomorrow morning ?                        ?Medical Decision Making ?Amount and/or Complexity of Data Reviewed ?Labs: ordered. ? ?Risk ?Decision regarding hospitalization. ? ?This patient presents to the ED for concern of poor kidney function, this involves an extensive number of treatment options, and is a complaint that carries with it a high risk of complications and morbidity.  The differential diagnosis includes dehydration.  Chronic kidney disease ? ? ?Co morbidities that complicate the  patient evaluation ? ?Hypertension ? ? ?Additional history obtained: ? ?Additional history obtained from patient ?External records from outside source obtained and reviewed including hospital records ? ? ?Lab Tests: ? ?I Ordered, and personally interpreted labs.  The pertinent results include: CBC and chemistries that showed creatinine elevated ? ? ?Imaging Studies ordered: ? ?No x-ray ?Cardiac Monitoring: ? ?The patient was maintained on a cardiac monitor.  I personally viewed and interpreted the cardiac monitored which showed an underlying rhythm  of: Normal sinus rhythm ? ? ?Medicines ordered and prescription drug management: ? ?I ordered medication including normal saline for dehydration ?Reevaluation of the patient after these medicines showed that the patient improved ?I have reviewed the patients home medicines and have made adjustments as needed ? ? ?Test Considered: ? ?Ultrasound acute ? ? ?Critical Interventions: ? ?None ? ? ?Consultations Obtained: ? ?I requested consultation with the nephrology and hospitalist,  and discussed lab and imaging findings as well as pertinent plan - they recommend: Admit for hydration ? ? ?Problem List / ED Course: ? ?AKI ? ? ? ?Social Determinants of Health: ? ?Lives in nursing home ? ? ? ? ? ? ? ?Patient will be admitted for AKI. ?Patient with dehydration and AKI he will be admitted to medicine ? ? ? ? ? ? ?Final Clinical Impression(s) / ED Diagnoses ?Final diagnoses:  ?AKI (acute kidney injury) (HCC)  ? ? ?Rx / DC Orders ?ED Discharge Orders   ? ? None  ? ?  ? ? ?  ?Bethann Berkshire, MD ?09/07/21 1750 ? ?

## 2021-09-07 NOTE — ED Triage Notes (Signed)
Pt bib ems from [facility] with reports of decreased kidney function. VSS. EMS was unable to get report from RN at facility. Pt unaware of why he had to come. Hx CHF and afib.  ?

## 2021-09-07 NOTE — Assessment & Plan Note (Signed)
Continue PPI ?

## 2021-09-07 NOTE — Assessment & Plan Note (Signed)
-   Patient has been encouraged to increase oral intake and to use feeding supplements. ?-Body mass index is 18.46 kg/m?Marland Kitchen ? ?

## 2021-09-07 NOTE — Assessment & Plan Note (Signed)
-  Blood pressure soft but stable ?-Continue treatment with hydrocortisone. ?-Will follow vital signs and orthostatic changes. ?

## 2021-09-07 NOTE — Assessment & Plan Note (Signed)
-   As mentioned above patient reports overall chronic decrease appetite ?-Body mass index is 18.46 kg/m?. ?-Continue encouraging oral intake and use feeding supplements. ?

## 2021-09-07 NOTE — Assessment & Plan Note (Signed)
-   Compensated currently ?-Holding diuretics and Entresto in the setting of acute renal failure ?-Cardiology service consulted ?-Heart healthy diet has been ordered ?-Will follow daily weights and strict I's and O's. ?

## 2021-09-07 NOTE — Assessment & Plan Note (Signed)
-   Continue outpatient follow-up with cardiology service ?-There is the possibility in the near future if patient continue improving, to pursuit surgical repair. ?

## 2021-09-07 NOTE — H&P (Signed)
History and Physical    PatientMarland Kitchen Roy Ryan ONG:[id number] DOB: September 05, 1950 DOA: 09/07/2021 DOS: the patient was seen and examined on 09/07/2021 PCP: Lubertha Sayres, FNP  Patient coming from: ALF/ILF  Chief Complaint:  Chief Complaint  Patient presents with   Abnormal Lab   HPI: Roy Ryan is a 71 y.o. male with medical history significant of prior history of alcohol abuse, atrial flutter, mitral regurgitation, history of seizure disorder, systolic heart failure and pulmonary hypertension; who presented to the emergency department from his assisted living facility due to abnormal labs.  Patient was evaluated about 7 days ago including performing blood work and he was found to have a creatinine of 3.5 (which is new for him, and elevated for 0.79 about 5 months ago). Patient reports no acute complaints, there has not been any decrease in his oral intake (even is not much at baseline, given decrease in his appetite chronically), no nausea, no vomiting, no fever, no shortness of breath, no dysuria, no hematuria, no sick contacts or any other complaints.  Of note in January he was seen by cardiology service and at that time he was initiated on Entresto and his Lasix dose was adjusted.  No other medication changes have been provided.  After this changes patient has not had any blood work or follow-up until the 1 mentioned above 7 days ago.  In the ED repeat blood work demonstrated normal sodium, normal potassium, bicarb 26, BUN 33 and creatinine 2.59.  Case was discussed with nephrology service who recommended hospital admission for gentle/judicious hydration, renal ultrasound, stopping nephrotoxic agents and repeat blood work in AM.   Review of Systems: As mentioned in the history of present illness. All other systems reviewed and are negative.  Past Medical History:  Diagnosis Date   Alcohol abuse    Atrial flutter ([place])    Mitral regurgitation    Seizures ([place])    Past Surgical History:   Procedure Laterality Date   TEE WITHOUT CARDIOVERSION N/A 02/25/2021   Procedure: TRANSESOPHAGEAL ECHOCARDIOGRAM (TEE);  Surgeon: Sanda Klein, MD;  Location[facility] ENDOSCOPY;  Service: Cardiovascular;  Laterality: N/A;   Social History:  reports that he has never smoked. He has never used smokeless tobacco. He reports that he does not drink alcohol and does not use drugs.  No Known Allergies  Family History  Problem Relation Age of Onset   CAD Neg Hx    Heart failure Neg Hx    Stroke Neg Hx     Prior to Admission medications   Medication Sig Start Date End Date Taking? Authorizing Provider  amiodarone (PACERONE) 200 MG tablet Take 1 tablet (200 mg total) by mouth 2 (two) times daily. 03/11/21 09/07/21 Yes Dessa Phi, DO  folic acid (FOLVITE) 1 MG tablet Take 1 tablet (1 mg total) by mouth daily. 03/12/21  Yes Dessa Phi, DO  furosemide (LASIX) 40 MG tablet Take 1 tablet (40 mg total) by mouth 2 (two) times daily. 03/11/21 09/07/21 Yes Dessa Phi, DO  hydrocortisone (CORTEF) 10 MG tablet Take 10 mg by mouth 2 (two) times daily. 07/13/21  Yes [provider]  mirtazapine (REMERON) 15 MG tablet Take 15 mg by mouth at bedtime.   Yes [provider]  Multiple Vitamin (MULTIVITAMIN WITH MINERALS) TABS tablet Take 1 tablet by mouth daily. 03/12/21  Yes Dessa Phi, DO  potassium chloride (KLOR-CON) 10 MEQ tablet Take 1 tablet (10 mEq total) by mouth 2 (two) times daily. 03/11/21  Yes Dessa Phi, DO  rivaroxaban (  XARELTO) 20 MG TABS tablet Take 20 mg by mouth daily with supper.   Yes [provider]  sacubitril-valsartan (ENTRESTO) 24-26 MG Take 1 tablet by mouth 2 (two) times daily. 07/15/21  Yes Minus Breeding, MD  thiamine 100 MG tablet Take 1 tablet (100 mg total) by mouth daily. 03/12/21  Yes Dessa Phi, DO  pantoprazole (PROTONIX) 40 MG tablet Take 1 tablet (40 mg total) by mouth daily. Patient not taking: Reported on 09/07/2021 03/12/21 06/10/21  Dessa Phi, DO    Physical Exam: Vitals:   09/07/21 1115 09/07/21 1131 09/07/21 1200 09/07/21 1230  BP:  (!) 89/48 (!) 92/51 (!) 102/55  Pulse: (!) 57  (!) 52   Resp: '13 16 10 ' (!) 21  Temp:      TempSrc:      SpO2: 98%  98%   Weight:      Height:       General exam: Alert, awake, oriented to person and place; disoriented to time.  No chest pain, no shortness of breath, no nausea, no vomiting.  Good saturation on room air. Respiratory system: Clear to auscultation. Respiratory effort normal.  No using accessory muscles. Cardiovascular system:Rate controlled; no rubs, no gallops, positive murmur appreciated on exam.  No JVD.   Gastrointestinal system: Abdomen is nondistended, soft and nontender. No organomegaly or masses felt. Normal bowel sounds heard. Central nervous system: Alert and oriented. No focal neurological deficits. Extremities: No cyanosis, clubbing or edema. Skin: No petechiae. Psychiatry: Mood and affect appropriate.  Data Reviewed: Magnesium 1.8 Phosphorus 3.1 As mentioned above comprehensive metabolic panel demonstrating sodium 137, potassium 4.2, bicarb 26, BUN 33, creatinine 2.59 and improved LFTs with AST 76 and ALT 92.  Alk phos is 138. CBC with WBCs of 3.5, hemoglobin 10.6 and platelet count 194,000.  Assessment and Plan: * Acute renal failure (ARF) ([place]) - Appears to be in the setting of nephrotoxic agents -Gentle fluid resuscitation will be provided, will check renal ultrasound and hold Lasix and Entresto. -Nephrology service recommended to be reconsulted in the morning if renal function fails to improve; otherwise he can be followed as an outpatient.  Gastroesophageal reflux disease - Continue PPI.  Adrenal insufficiency (HCC) -Blood pressure soft but stable -Continue treatment with hydrocortisone. -Will follow vital signs and orthostatic changes.  Nonrheumatic mitral valve regurgitation - Continue outpatient follow-up with cardiology service -There  is the possibility in the near future if patient continue improving, to pursuit surgical repair.  Protein-calorie malnutrition, severe - As mentioned above patient reports overall chronic decrease appetite -Body mass index is 18.46 kg/m. -Continue encouraging oral intake and use feeding supplements.  Failure to thrive in adult - Patient has been encouraged to increase oral intake and to use feeding supplements. -Body mass index is 18.46 kg/m.   Atrial fibrillation with rapid ventricular response (HCC) -Continue treatment with amiodarone -Patient chronically on Xarelto for secondary prevention; on hold due to acute renal failure -Using heparin for prophylaxis as he is currently rate controlled and in sinus at this moment. -Paroxysmal atrial fibrillation/atrial flutter, with prior history of A-fib with RVR.  HFrEF (heart failure with reduced ejection fraction) ([place]) - Compensated currently -Holding diuretics and Entresto in the setting of acute renal failure -Cardiology service consulted -Heart healthy diet has been ordered -Will follow daily weights and strict I's and O's.  Hypotension - In the setting of chronic renal insufficiency 1 newly initiating treatment with Entresto and adjusting Lasix dosages. -Gentle fluid resuscitation has been provided -will Lasix  and Entresto overnight -Cardiology service has been consulted for further guidance regarding adjusting medications dosages.     Advance Care Planning:   Code Status: DNR   Consults: Nephrology service (Dr. Joelyn Oms) curbside; cardiology service.  Family Communication: No family at bedside.  Severity of Illness: The appropriate patient status for this patient is OBSERVATION. Observation status is judged to be reasonable and necessary in order to provide the required intensity of service to ensure the patient's safety. The patient's presenting symptoms, physical exam findings, and initial radiographic and laboratory data  in the context of their medical condition is felt to place them at decreased risk for further clinical deterioration. Furthermore, it is anticipated that the patient will be medically stable for discharge from the hospital within 2 midnights of admission.   Author: Barton Dubois, MD 09/07/2021 1:18 PM  For on call review [email or web address].

## 2021-09-07 NOTE — Assessment & Plan Note (Signed)
-   In the setting of chronic renal insufficiency 1 newly initiating treatment with Entresto and adjusting Lasix dosages. ?-Gentle fluid resuscitation has been provided ?-will Lasix and Entresto overnight ?-Cardiology service has been consulted for further guidance regarding adjusting medications dosages. ? ?

## 2021-09-07 NOTE — ED Notes (Signed)
EDP aware of blood pressure ?

## 2021-09-07 NOTE — Assessment & Plan Note (Signed)
-  Continue treatment with amiodarone ?-Patient chronically on Xarelto for secondary prevention; on hold due to acute renal failure ?-Using heparin for prophylaxis as he is currently rate controlled and in sinus at this moment. ?-Paroxysmal atrial fibrillation/atrial flutter, with prior history of A-fib with RVR. ?

## 2021-09-08 DIAGNOSIS — R627 Adult failure to thrive: Secondary | ICD-10-CM | POA: Diagnosis not present

## 2021-09-08 DIAGNOSIS — I480 Paroxysmal atrial fibrillation: Secondary | ICD-10-CM

## 2021-09-08 DIAGNOSIS — I4891 Unspecified atrial fibrillation: Secondary | ICD-10-CM | POA: Diagnosis not present

## 2021-09-08 DIAGNOSIS — N179 Acute kidney failure, unspecified: Secondary | ICD-10-CM | POA: Diagnosis not present

## 2021-09-08 DIAGNOSIS — E2740 Unspecified adrenocortical insufficiency: Secondary | ICD-10-CM | POA: Diagnosis not present

## 2021-09-08 DIAGNOSIS — I340 Nonrheumatic mitral (valve) insufficiency: Secondary | ICD-10-CM | POA: Diagnosis not present

## 2021-09-08 LAB — BASIC METABOLIC PANEL
Anion gap: 5 (ref 5–15)
BUN: 27 mg/dL — ABNORMAL HIGH (ref 8–23)
CO2: 25 mmol/L (ref 22–32)
Calcium: 8.5 mg/dL — ABNORMAL LOW (ref 8.9–10.3)
Chloride: 109 mmol/L (ref 98–111)
Creatinine, Ser: 1.82 mg/dL — ABNORMAL HIGH (ref 0.61–1.24)
GFR, Estimated: 40 mL/min — ABNORMAL LOW (ref >60)
Glucose, Bld: 94 mg/dL (ref 70–99)
Potassium: 4.2 mmol/L (ref 3.5–5.1)
Sodium: 139 mmol/L (ref 135–145)

## 2021-09-08 LAB — TSH: TSH: 1.193 u[IU]/mL (ref 0.350–4.500)

## 2021-09-08 MED ORDER — FOLIC ACID 1 MG PO TABS: 1.0000 mg | ORAL_TABLET | Freq: Every day | ORAL | 4 refills | Status: AC

## 2021-09-08 MED ORDER — AMIODARONE HCL 200 MG PO TABS: 200.0000 mg | ORAL_TABLET | Freq: Every day | ORAL | 11 refills | Status: AC

## 2021-09-08 MED ORDER — HYDROCORTISONE 10 MG PO TABS: 10.0000 mg | ORAL_TABLET | Freq: Two times a day (BID) | ORAL | 4 refills | Status: AC

## 2021-09-08 MED ORDER — RIVAROXABAN 15 MG PO TABS: 15.0000 mg | ORAL_TABLET | Freq: Every day | ORAL | 3 refills | Status: AC

## 2021-09-08 MED ORDER — FUROSEMIDE 20 MG PO TABS: 20.0000 mg | ORAL_TABLET | Freq: Every day | ORAL | 3 refills | Status: AC

## 2021-09-08 MED ORDER — PANTOPRAZOLE SODIUM 40 MG PO TBEC: 40.0000 mg | DELAYED_RELEASE_TABLET | Freq: Every day | ORAL | 2 refills | Status: AC

## 2021-09-08 NOTE — Care Management Obs Status (Signed)
MEDICARE OBSERVATION STATUS NOTIFICATION ? ? ?Patient Details  ?Name: Roy Ryan ?MRN: [id number] ?Date of Birth: 1951/02/02 ? ? ?Medicare Observation Status Notification Given:  Yes ? ? ? ?Corey Harold ?09/08/2021, 3:08 PM ?

## 2021-09-08 NOTE — Consult Note (Addendum)
Cardiology Consultation:   Patient ID: Roy Ryan MRN: [id number]; DOB: February 03, 1951  Admit date: 09/07/2021 Date of Consult: 09/08/2021  PCP:  Lubertha Sayres, [place] [facility] Cardiologist:  Quay Burow, MD (Evaluated by Dr. Gwenlyn Found during admission in 2021, evaluated by Dr. Percival Spanish during office visit in 07/2021)  Patient Profile:   Roy Ryan is a 71 y.o. male with a hx of paroxysmal atrial fibrillation/flutter (diagnosed in 2021 and initially not felt to be a candidate for anticoagulation given alcohol abuse and medication noncompliance, was on Xarelto in 07/2021), severe MR (not felt to be a candidate for Mitra-Clip in 02/2021 and Palliative Care recommended), pulmonary HTN, history of PE, alcohol abuse, adrenal insufficiency, malnutrition and prior seizures who is being seen 09/08/2021 for the evaluation of medication management in the setting of an AKI at the request of Dr. Dyann Kief.  History of Present Illness:   [name] was last examined by Dr. Percival Spanish in 07/2021 and was residing at [facility] at that time. He was back in NSR and Amiodarone was reduced to 200mg  daily. It was mentioned a message was sent to [facility] to see if he would be a candidate for repair of his mitral valve since his social issues had improved and he was not longer consuming alcohol. He was started on Entresto 24-26mg  BID in the setting of pulmonary HTN. Was continued on Lasix 40mg  BID. Was informed to follow-up in 6 weeks but has not been evaluated since.   He presented to the ED on 08/31/2021 for evaluation of chest pain and abdominal pain, reporting a decreased appetite for the past 24 hours. Hs Troponin values were negative at 13 and 15 with BNP normal at 86. His creatinine was elevated to 3.53 (previously 0.79 in 03/2021). Admission was recommended but he declined and left AMA.   He presented back to [facility] ED on 09/07/2021 for worsening renal function. BP was soft at 102/55 while in the ED.  Initial labs showed WBC 3.5, Hgb 10.6, platelets 194, Na+ 137, K+ 4.2 and creatinine 2.59. Mg 1.8. Renal US showed no evidence of hydronephrosis.   Lasix and Entresto were held on admission and creatinine has further improved to 1.82 today.   In talking with the patient, he reports moving to [place] [facility] two weeks ago and has not enjoyed the food there and reports minimal food intake. Says he "eats enough to survive". Says he does consume liquids throughout the day. Says he sits in a wheelchair a majority of the time but will walk behind his wheelchair and push it for exercise. Reports some dyspnea but no chest pain or palpitations. No recent orthopnea, PND or pitting edema.    Past Medical History:  Diagnosis Date   Alcohol abuse    Atrial flutter ([place])    Mitral regurgitation    Seizures ([place])     Past Surgical History:  Procedure Laterality Date   TEE WITHOUT CARDIOVERSION N/A 02/25/2021   Procedure: TRANSESOPHAGEAL ECHOCARDIOGRAM (TEE);  Surgeon: Sanda Klein, MD;  Location: [facility] ENDOSCOPY;  Service: Cardiovascular;  Laterality: N/A;     Home Medications:  Prior to Admission medications   Medication Sig Start Date End Date Taking? Authorizing Provider  amiodarone (PACERONE) 200 MG tablet Take 1 tablet (200 mg total) by mouth 2 (two) times daily. 03/11/21 09/07/21 Yes Dessa Phi, DO  folic acid (FOLVITE) 1 MG tablet Take 1 tablet (1 mg total) by mouth daily. 03/12/21  Yes Dessa Phi, DO  furosemide (LASIX) 40  MG tablet Take 1 tablet (40 mg total) by mouth 2 (two) times daily. 03/11/21 09/07/21 Yes Dessa Phi, DO  hydrocortisone (CORTEF) 10 MG tablet Take 10 mg by mouth 2 (two) times daily. 07/13/21  Yes [provider]  mirtazapine (REMERON) 15 MG tablet Take 15 mg by mouth at bedtime.   Yes [provider]  Multiple Vitamin (MULTIVITAMIN WITH MINERALS) TABS tablet Take 1 tablet by mouth daily. 03/12/21  Yes Dessa Phi, DO  potassium chloride (KLOR-CON) 10 MEQ  tablet Take 1 tablet (10 mEq total) by mouth 2 (two) times daily. 03/11/21  Yes Dessa Phi, DO  rivaroxaban (XARELTO) 20 MG TABS tablet Take 20 mg by mouth daily with supper.   Yes [provider]  sacubitril-valsartan (ENTRESTO) 24-26 MG Take 1 tablet by mouth 2 (two) times daily. 07/15/21  Yes Minus Breeding, MD  thiamine 100 MG tablet Take 1 tablet (100 mg total) by mouth daily. 03/12/21  Yes Dessa Phi, DO  pantoprazole (PROTONIX) 40 MG tablet Take 1 tablet (40 mg total) by mouth daily. Patient not taking: Reported on 09/07/2021 03/12/21 06/10/21  Dessa Phi, DO    Inpatient Medications: Scheduled Meds:  amiodarone  200 mg Oral Daily   feeding supplement  237 mL Oral BID BM   folic acid  1 mg Oral Daily   heparin injection (subcutaneous)  5,000 Units Subcutaneous Q8H   hydrocortisone  10 mg Oral BID   mirtazapine  15 mg Oral QHS   multivitamin with minerals  1 tablet Oral Daily   pantoprazole  40 mg Oral Daily   thiamine  100 mg Oral Daily    PRN Meds: acetaminophen **OR** acetaminophen, ondansetron **OR** ondansetron (ZOFRAN) IV  Allergies:   No Known Allergies  Social History:   Social History   Tobacco Use   Smoking status: Never   Smokeless tobacco: Never  Substance Use Topics   Alcohol use: Never     Family History:    Family History  Problem Relation Age of Onset   CAD Neg Hx    Heart failure Neg Hx    Stroke Neg Hx      ROS:  Please see the history of present illness.  All other ROS reviewed and negative.     Physical Exam/Data:   Vitals:   09/07/21 1734 09/07/21 2034 09/07/21 2038 09/08/21 0615  BP:  (!) 99/50  (!) 97/50  Pulse:  (!) 51 (!) 51 (!) 54  Resp:  20  18  Temp:  97.7 F (36.5 C)  98 F (36.7 C)  TempSrc:  Oral  Oral  SpO2: 100%  91% 100%  Weight:    62.5 kg  Height:    6\' 2"  (1.88 m)    Intake/Output Summary (Last 24 hours) at 09/08/2021 0907 Last data filed at 09/08/2021 [id number] Gross per 24 hour  Intake 2085.55 ml   Output 450 ml  Net 1635.55 ml   Last 3 Weights 09/08/2021 09/07/2021 09/07/2021  Weight (lbs) 137 lb 12.6 oz 143 lb 11.8 oz 138 lb  Weight (kg) 62.5 kg 65.2 kg 62.596 kg     Body mass index is 17.69 kg/m.  General:  Thin elderly male appearing in no acute distress.  HEENT: normal Neck: no JVD Vascular: No carotid bruits; Distal pulses 2+ bilaterally Cardiac:  normal S1, S2; Regular rhythm, bradycardiac rate. 3/6 holosystolic murmur along Apex.  Lungs:  clear to auscultation bilaterally, no wheezing, rhonchi or rales  Abd: soft, nontender, no hepatomegaly  Ext:  no pitting edema Musculoskeletal:  No deformities, BUE and BLE strength normal and equal Skin: warm and dry  Neuro:  CNs 2-12 intact, no focal abnormalities noted Psych:  Normal affect   EKG:  The EKG from 08/31/2021 was personally reviewed and demonstrates: NSR, HR 60 with 1st degree AV Block and IVCD with TWI along the inferior and anterior leads.   Telemetry:  Telemetry was personally reviewed and demonstrates: Sinus bradycardia, HR in 50's to 60's with 1st degree AV block.   Relevant CV Studies:  Echocardiogram: 02/23/2021 IMPRESSIONS     1. 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 Very eccentric,  anteriorly directed jet of mitral regurgitation. P2 scallop  prolapse/possible flail noted on 3D images.. The mitral valve is  myxomatous. Severe mitral valve regurgitation. No  evidence of mitral stenosis.   2. Left ventricular ejection fraction, by estimation, is 50%. Left  ventricular ejection fraction by 3D volume is 53 %. The left ventricle has  mildly decreased function. The left ventricle has no regional wall motion  abnormalities. There is mild left  ventricular hypertrophy. Left ventricular diastolic parameters are  indeterminate.   3. Right ventricular systolic function is moderately reduced. The right  ventricular size is mildly enlarged. There is moderately elevated  pulmonary artery systolic  pressure. The estimated right ventricular  systolic pressure is [id number] mmHg.   4. Left atrial size was severely dilated.   5. Right atrial size was moderately dilated.   6. Tricuspid valve regurgitation is moderate.   7. The aortic valve is tricuspid. There is mild thickening of the aortic  valve. Aortic valve regurgitation is mild. No aortic stenosis is present.   8. Aortic dilatation noted. There is borderline dilatation of the aortic  root, measuring 39 mm.   9. The inferior vena cava is dilated in size with <50% respiratory  variability, suggesting right atrial pressure of 15 mmHg.   Conclusion(s)/Recommendation(s): Recommend TEE for further evaluation of  mitral valve prolapse.   TEE: 02/25/2021 IMPRESSIONS     1. Left ventricular ejection fraction, by estimation, is 60 to 65%. The  left ventricle has normal function. The left ventricle has no regional  wall motion abnormalities. Left ventricular diastolic function could not  be evaluated.   2. Right ventricular systolic function is mildly reduced. The right  ventricular size is normal. There is moderately elevated pulmonary artery  systolic pressure.   3. Left atrial size was moderately dilated. No left atrial/left atrial  appendage thrombus was detected.   4. Right atrial size was moderately dilated.   5. Moderate pleural effusion in the left lateral region.   6. There is severe holosystolic prolapse of both mitral leaflets, but  this is particularly severe at the level of the middle (P2) scallop and  medial (P3) scallop of the posterior leaflet. P3 may be flail. The vena  contracta is 6 mm and the effective  regurgitant area is 1.3 cm. The regurgitant volume is 130 ml, the  regurgitant fraction is 80%. The maximum flail gap is 8 mm (at P3). MV  area is 6 cm. The mitral valve is myxomatous. Severe mitral valve  regurgitation. The mean mitral valve gradient is  3.5 mmHg.   7. The tricuspid valve is myxomatous. Tricuspid  valve regurgitation is  mild to moderate.   8. The aortic valve is tricuspid. Aortic valve regurgitation is not  visualized. No aortic stenosis is present.   9. There is severe spontaneous echo contrast seen in the ascending  aorta,  consistent with very low forward cardiac output.   Laboratory Data:  High Sensitivity Troponin:   Recent Labs  Lab 08/31/21 1841 08/31/21 2026  TROPONINIHS 15 13     Chemistry Recent Labs  Lab Oct 03, 2021 1050 09/08/21 0500  NA 137 139  K 4.2 4.2  CL 103 109  CO2 26 25  GLUCOSE 110* 94  BUN 33* 27*  CREATININE 2.59* 1.82*  CALCIUM 8.9 8.5*  MG 1.8  --   GFRNONAA 26* 40*  ANIONGAP 8 5    Recent Labs  Lab 10/03/2021 1050  PROT 6.9  ALBUMIN 3.2*  AST 76*  ALT 92*  ALKPHOS 138*  BILITOT 0.6    Hematology Recent Labs  Lab 10-03-21 1050  WBC 3.5*  RBC 3.65*  HGB 10.6*  HCT 33.4*  MCV 91.5  MCH 29.0  MCHC 31.7  RDW 14.9  PLT 194    Radiology/Studies:  US RENAL  Result Date: 10-03-2021 CLINICAL DATA:  AK I EXAM: RENAL / URINARY TRACT ULTRASOUND COMPLETE COMPARISON:  CT August 31, 2021 FINDINGS: Right Kidney: Renal measurements: 11.3 x 5.1 x 4.4 cm = volume: 133 mL. Echogenicity within normal limits. No mass or hydronephrosis visualized. Left Kidney: Renal measurements: 10.9 x 4.6 x 4.6 cm = volume: 120 mL. Echogenicity within normal limits. No mass or hydronephrosis visualized. Bladder: Appears normal for degree of bladder distention. Other: None. IMPRESSION: No hydronephrosis. Electronically Signed   By: Dahlia Bailiff M.D.   On: 10-03-2021 14:39     Assessment and Plan:   1. Paroxysmal Atrial Fibrillation/Flutter - He was previously not on anticoagulation due to alcohol abuse and noncompliance but has been on anticoagulation for 6+ months due to prior PE. Would plan to restart Xarelto at discharge but should be on 15mg  daily given his Estimated Creatinine Clearance: 34.3 mL/min (A) (by C-G formula based on SCr of 1.82 mg/dL  (H)). - He has been on Amiodarone 200mg  daily and is maintaining NSR. LFT's were elevated on admission with AST 215 and ALT 153 but improved to 76 and 92 today. Would recheck as an outpatient in 7-10 days if discharged today. Will add-on TSH to prior labs. No BB or CCB given his baseline HR in the 50's.   2. Severe MR - TEE in 02/2021 showed severe prolapse of both partial leaflets and P3 may be flail. Overall severe MR. Dr. Percival Spanish had mentioned in prior notes to review with [facility] but by review of the TEE notes "He is a very poor candidate for surgical repair (malnutrition, cachexia) and MitraClip repair would be very challenging due to the complex and extensive mitral valve pathology and the relatively small left atrium." - Reviewed with the patient again today and he is aware he is not a candidate for open-repair given his frail state and says he would not want to pursue this. He is unsure if he would want to pursue MitraClip. As discussed above, likely not a candidate. Can readdress at outpatient follow-up.   3. HFpEF - Echo in 02/2021 showed his EF at 50% and 60-65% by TEE. Was started on Entresto in 07/2021 but presented with hypotension and an AKI. Given his preserved EF, there is not a strong indication to continue Entresto at this time and suspect hypotension (BP at 89/48 - 129/61 within the past 24 hours) contributed to his AKI along with his decreased PO intake and associated weight loss. Was taking Lasix 40mg  BID prior to admission which is currently held. Would  anticipate holding Entresto at discharge and restarting Lasix at a lower dose of 20mg  daily and can titrate if needed based off weights and reassessment of his renal function. Would not use an SGLT-2 inhibitor given his decreased PO intake and malnutrition. No BB given baseline bradycardia.   4. Adrenal Insufficiency - He has been continued on PTA Hydrocortisone.   5. AKI - His creatinine was at 3.53 on 08/31/2021, at  2.59 this admission and imprved to 1.82 today. He initially received IV fluids which have now been held. Agree with stopping Entresto and can likely restart Lasix at a lower dose prior to discharge.    Risk Assessment/Risk Scores:   This patients CHA2DS2-VASc Score and unadjusted Ischemic Stroke Rate (% per year) is equal to 4.8 % stroke rate/year from a score of 4 (Vascular, Age, Prior PE (2)).  Above score calculated as 1 point each if present [CHF, HTN, DM, Vascular=MI/PAD/Aortic Plaque, Age if 65-74, or Male] Above score calculated as 2 points each if present [Age > 75, or Stroke/TIA/TE]   For questions or updates, please contact [place] Please consult [email or web address] for contact info under    Signed, Erma Heritage, PA-C  09/08/2021 9:07 AM    Attending note:  Patient seen and examined.  I reviewed his records and discussed the case with Ms. Delano Metz, I agree with her above findings.  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  He reports relatively poor oral intake at baseline, has been on Entresto and Lasix as an outpatient.  On examination this morning he appears comfortable, lying supine, reports no breathlessness.  He is afebrile, heart rate is in the 50s to 60s in sinus bradycardia, blood pressure 97/50, not orthostatic.  Lungs are clear with decreased breath sounds.  Cardiac exam with RRR and 3/6 apical holosystolic murmur.  No peripheral edema.  Pertinent lab work includes potassium 4.2, BUN 27, creatinine 1.82 down from peak of 3.53 as of February 28, hemoglobin 10.6, platelets 194.  Recent chest CT including abdomen and pelvis showed emphysematous changes with right apical scarring, no infiltrates,  gallbladder sludge without cholelithiasis, and atherosclerotic changes in the aorta.  In review of recent medications and current situation would recommend discontinuation of Entresto going forward, continue to hold Lasix for now.  When ready for discharge consider a reduced Lasix dose at 20 mg daily which could be adjusted for weight gain or edema.  He has fairly poor oral intake and malnutrition, would not use SGLT2 inhibitor.  Also bradycardic at baseline so hold off on beta-blocker therapy.  Xarelto 15 mg daily can be resumed (this was treatment for pulmonary embolus and he also has PAF).  Management of his mitral regurgitation has been discussed previously, recommendation for palliative care as most likely suboptimal candidate for MitraClip given valve disease complexity.  This can be readdressed by Dr. Percival Spanish as an outpatient.  Satira Sark, M.D., F.A.C.C.

## 2021-09-08 NOTE — Discharge Instructions (Signed)
1)recommend the following medication changes:- discontinuation of Entresto going forward,  reduced Lasix dose to 20 mg daily which could be adjusted for weight gain or edema.  ?-Use  Xarelto 15 mg daily (this was treatment for pulmonary embolus and he also has atrial fibrillation) ? ?2)Avoid ibuprofen/Advil/Aleve/Motrin/Goody Powders/Naproxen/BC powders/Meloxicam/Diclofenac/Indomethacin and other Nonsteroidal anti-inflammatory medications as these will make you more likely to bleed and can cause stomach ulcers, can also cause Kidney problems.  ? ?3)Repeat CBC and BMP Blood Tests on Monday 09/13/21 ? ?4)Very low-salt diet advised ? ?5)Weigh yourself daily, call if you gain more than 3 pounds in 1 day or more than 5 pounds in 1 week as your diuretic medications may need to be adjusted ?

## 2021-09-08 NOTE — TOC Transition Note (Addendum)
Transition of Care (TOC) - CM/SW Discharge Note ? ? ?Patient Details  ?Name: Roy Ryan ?MRN: [id number] ?Date of Birth: 01/21/1951 ? ?Transition of Care (TOC) CM/SW Contact:  ?Leitha Bleak, RN ?Phone Number: ?09/08/2021, 11:47 AM ? ? ?Clinical Narrative:   Patient is discharging back to [facility]. TOC completing FL2.  ? ?TOC consulted for Substance Abuse. Patient states he has not drank any alcohol since August 2022.   ? ?Final next level of care: Assisted Living ?Barriers to Discharge: Barriers Resolved ? ?Patient Goals and CMS Choice ?Patient states their goals for this hospitalization and ongoing recovery are:: to return to [facility] ?CMS Medicare.gov Compare Post Acute Care list provided to:: Patient ?Choice offered to / list presented to : Patient ? ?Discharge Placement ?            ?Patient chooses bed at:  [facility]) ?  ? Patient and family notified of of transfer: 09/08/21 ? ?Discharge Plan and Services ?  ?  ?

## 2021-09-08 NOTE — NC FL2 (Signed)
?[place] MEDICAID FL2 LEVEL OF CARE SCREENING TOOL  ?  ? ?IDENTIFICATION  ?Patient Name: ?Roy Ryan Birthdate: Dec 22, 1950 Sex: male Admission Date (Current Location): ?09/07/2021  ?[place] Number: ? [place] ?  Facility and Address:  ?[facility],  [address] [place] [address] ?     Provider Number: ?[id number]  ?Attending Physician Name and Address:  ?Shon Hale, MD ? Relative Name and Phone Number:  ?  ?   ?Current Level of Care: ?Hospital Recommended Level of Care: ?Assisted Living Facility Prior Approval Number: ?  ? ?Date Approved/Denied: ?  PASRR Number: ?  ? ?Discharge Plan: ?Domiciliary (Rest home) ?  ? ?Current Diagnoses: ?Patient Active Problem List  ? Diagnosis Date Noted  ? Acute renal failure (ARF) (HCC) 09/07/2021  ? Adrenal insufficiency (HCC) 09/07/2021  ? Gastroesophageal reflux disease 09/07/2021  ? Palliative care by specialist   ? Goals of care, counseling/discussion   ? General weakness   ? Nonrheumatic mitral valve regurgitation   ? Hypotension 02/23/2021  ? HFrEF (heart failure with reduced ejection fraction) (HCC) 02/23/2021  ? Atrial fibrillation with rapid ventricular response (HCC) 02/23/2021  ? Hypomagnesemia 02/23/2021  ? Chronic alcoholic liver disease (HCC) 02/23/2021  ? Hypoalbuminemia 02/23/2021  ? Failure to thrive in adult 02/23/2021  ? Mild protein-calorie malnutrition (HCC) 02/23/2021  ? Thrombocytopenia (HCC) 02/23/2021  ? Hypoglycemia 02/23/2021  ? Hypothermia 02/23/2021  ? Protein-calorie malnutrition, severe 02/23/2021  ? Chest pain 01/31/2020  ? ? ?Orientation RESPIRATION BLADDER Height & Weight   ?  ?Self, Time, Situation ? Normal Incontinent Weight: 62.5 kg ?Height:  6\' 2"  (188 cm)  ?BEHAVIORAL SYMPTOMS/MOOD NEUROLOGICAL BOWEL NUTRITION STATUS  ?    Continent Diet (Regular)  ?AMBULATORY STATUS COMMUNICATION OF NEEDS Skin   ?Limited Assist Verbally Normal ?  ?  ?  ?    ?     ?     ? ? ?Personal Care Assistance Level of Assistance   ?Bathing, Feeding, Dressing Bathing Assistance: Limited assistance ?Feeding assistance: Independent ?Dressing Assistance: Limited assistance ?   ? ?Functional Limitations Info  ?Sight, Hearing, Speech Sight Info: Adequate ?Hearing Info: Adequate ?Speech Info: Adequate  ? ? ?SPECIAL CARE FACTORS FREQUENCY  ?PT (By licensed PT)   ?  ?PT Frequency: Evaluate and treat ?  ?  ?  ?  ?   ? ? ?Contractures Contractures Info: Not present  ? ? ?Additional Factors Info  ?Code Status, Allergies Code Status Info: DNR ?Allergies Info: NKDA ?  ?  ?  ?   ? ?Current Medications (09/08/2021):  This is the current hospital active medication list ?Current Facility-Administered Medications  ?Medication Dose Route Frequency Provider Last Rate Last Admin  ? acetaminophen (TYLENOL) tablet 650 mg  650 mg Oral Q6H PRN Vassie Loll, MD      ? Or  ? acetaminophen (TYLENOL) suppository 650 mg  650 mg Rectal Q6H PRN Vassie Loll, MD      ? amiodarone (PACERONE) tablet 200 mg  200 mg Oral Daily Vassie Loll, MD   200 mg at 09/08/21 8546  ? feeding supplement (ENSURE ENLIVE / ENSURE PLUS) liquid 237 mL  237 mL Oral BID BM Vassie Loll, MD   237 mL at 09/08/21 1406  ? folic acid (FOLVITE) tablet 1 mg  1 mg Oral Daily Vassie Loll, MD   1 mg at 09/08/21 2703  ? heparin injection 5,000 Units  5,000 Units Subcutaneous Q8H Vassie Loll, MD   5,000 Units at 09/08/21  0507  ? hydrocortisone (CORTEF) tablet 10 mg  10 mg Oral BID Barton Dubois, MD   10 mg at 09/08/21 [id number]  ? mirtazapine (REMERON) tablet 15 mg  15 mg Oral QHS Barton Dubois, MD   15 mg at 09/07/21 2100  ? multivitamin with minerals tablet 1 tablet  1 tablet Oral Daily Barton Dubois, MD   1 tablet at 09/08/21 [id number]  ? ondansetron (ZOFRAN) tablet 4 mg  4 mg Oral Q6H PRN Barton Dubois, MD      ? Or  ? ondansetron (ZOFRAN) injection 4 mg  4 mg Intravenous Q6H PRN Barton Dubois, MD      ? pantoprazole (PROTONIX) EC tablet 40 mg  40 mg Oral Daily Barton Dubois, MD   40 mg at 09/08/21  [id number]  ? thiamine tablet 100 mg  100 mg Oral Daily Barton Dubois, MD   100 mg at 09/08/21 [id number]  ? ? ? ?Discharge Medications: ? ?Medication List  ?   ?  ?STOP taking these medications   ?  ?sacubitril-valsartan 24-26 MG ?Commonly known as: ENTRESTO ?   ?  ?   ?  ?TAKE these medications   ?  ?amiodarone 200 MG tablet ?Commonly known as: PACERONE ?Take 1 tablet (200 mg total) by mouth daily. ?What changed: when to take this ?   ?folic acid 1 MG tablet ?Commonly known as: FOLVITE ?Take 1 tablet (1 mg total) by mouth daily. ?   ?furosemide 20 MG tablet ?Commonly known as: Lasix ?Take 1 tablet (20 mg total) by mouth daily. ?What changed:  ?medication strength ?how much to take ?when to take this ?   ?hydrocortisone 10 MG tablet ?Commonly known as: CORTEF ?Take 1 tablet (10 mg total) by mouth 2 (two) times daily. ?   ?mirtazapine 15 MG tablet ?Commonly known as: REMERON ?Take 15 mg by mouth at bedtime. ?   ?multivitamin with minerals Tabs tablet ?Take 1 tablet by mouth daily. ?   ?pantoprazole 40 MG tablet ?Commonly known as: PROTONIX ?Take 1 tablet (40 mg total) by mouth daily. ?   ?potassium chloride 10 MEQ tablet ?Commonly known as: KLOR-CON M ?Take 1 tablet (10 mEq total) by mouth 2 (two) times daily. ?   ?Rivaroxaban 15 MG Tabs tablet ?Commonly known as: XARELTO ?Take 1 tablet (15 mg total) by mouth daily with supper. ?What changed:  ?medication strength ?how much to take ?   ?thiamine 100 MG tablet ?Take 1 tablet (100 mg total) by mouth daily. ?   ?  ?   ? ? ?Relevant Imaging Results: ? ?Relevant Lab Results: ? ? ?Additional Information ?Ss# [id number] ? ?Boneta Lucks, RN ? ? ? ? ?

## 2021-09-08 NOTE — Discharge Summary (Signed)
Roy CourserWilliam Temme, is a 71 y.o. male  DOB 10/02/1950  MRN [id number].  Admission date:  09/07/2021  Admitting Physician  Vassie Lollarlos [place], MD  Discharge Date:  09/08/2021   Primary MD  Christene LyeShokes, Wendy, FNP  Recommendations for primary care physician for things to follow:   1)recommend the following medication changes:- discontinuation of Entresto going forward,  reduced Lasix dose to 20 mg daily which could be adjusted for weight gain or edema.  -Use  Xarelto 15 mg daily (this was treatment for pulmonary embolus and he also has atrial fibrillation)  2)Avoid ibuprofen/Advil/Aleve/Motrin/Goody Powders/Naproxen/BC powders/Meloxicam/Diclofenac/Indomethacin and other Nonsteroidal anti-inflammatory medications as these will make you more likely to bleed and can cause stomach ulcers, can also cause Kidney problems.   3)Repeat CBC and BMP Blood Tests on Monday 09/13/21  4)Very low-salt diet advised  5)Weigh yourself daily, call if you gain more than 3 pounds in 1 day or more than 5 pounds in 1 week as your diuretic medications may need to be adjusted  Admission Diagnosis  Acute renal failure (ARF) (HCC) [N17.9] AKI (acute kidney injury) (HCC) [N17.9]   Discharge Diagnosis  Acute renal failure (ARF) (HCC) [N17.9] AKI (acute kidney injury) (HCC) [N17.9]    Principal Problem:   Acute renal failure (ARF) (HCC) Active Problems:   Hypotension   HFrEF (heart failure with reduced ejection fraction) (HCC)   Atrial fibrillation with rapid ventricular response (HCC)   Failure to thrive in adult   Protein-calorie malnutrition, severe   Nonrheumatic mitral valve regurgitation   Adrenal insufficiency (HCC)   Gastroesophageal reflux disease      Past Medical History:  Diagnosis Date   Alcohol abuse    Atrial flutter (HCC)    Mitral regurgitation    Seizures (HCC)     Past Surgical History:  Procedure Laterality Date    TEE WITHOUT CARDIOVERSION N/A 02/25/2021   Procedure: TRANSESOPHAGEAL ECHOCARDIOGRAM (TEE);  Surgeon: Thurmon Fairroitoru, Mihai, MD;  Location: [facility];  Service: Cardiovascular;  Laterality: N/A;     HPI  from the history and physical done on the day of admission:     HPI: Roy CourserWilliam Ryan is a 71 y.o. male with medical history significant of prior history of alcohol abuse, atrial flutter, mitral regurgitation, history of seizure disorder, systolic heart failure and pulmonary hypertension; who presented to the emergency department from his assisted living facility due to abnormal labs.  Patient was evaluated about 7 days ago including performing blood work and he was found to have a creatinine of 3.5 (which is new for him, and elevated for 0.79 about 5 months ago). Patient reports no acute complaints, there has not been any decrease in his oral intake (even is not much at baseline, given decrease in his appetite chronically), no nausea, no vomiting, no fever, no shortness of breath, no dysuria, no hematuria, no sick contacts or any other complaints.   Of note in January he was seen by cardiology service and at that time he was initiated on Entresto and his Lasix dose was  adjusted.  No other medication changes have been provided.  After this changes patient has not had any blood work or follow-up until the 1 mentioned above 7 days ago.   In the ED repeat blood work demonstrated normal sodium, normal potassium, bicarb 26, BUN 33 and creatinine 2.59.  Case was discussed with nephrology service who recommended hospital admission for gentle/judicious hydration, renal ultrasound, stopping nephrotoxic agents and repeat blood work in AM.   Review of Systems: As mentioned in the history of present illness. All other systems reviewed and are negative.     Hospital Course:    Assessment and Plan: * AKI- - Appears to be in the setting of nephrotoxic agents -Received gentle hydration -Renal ultrasound without  obstructive uropathy,  -Creatinine improving -Reduce Lasix to 20 mg daily -Discontinue Entresto as recommended by cardiologist -Repeat BMP on Monday, 09/13/2021 -Avoid NSAIDs   Gastroesophageal reflux disease - Continue PPI.  Adrenal insufficiency (HCC) -Blood pressure stable -Continue PTA hydrocortisone.  Nonrheumatic mitral valve regurgitation -likely suboptimal candidate for MitraClip given valve disease complexity -Outpatient follow-up with patient's cardiologist Dr. Antoine Poche to further discuss possible valve repair  Protein-calorie malnutrition, severe/FTT - As mentioned above patient reports overall chronic decrease appetite -Body mass index is 18.46 kg/m. -Continue encouraging oral intake and use feeding supplements. -Continue Remeron for appetite stimulation-  Atrial fibrillation with rapid ventricular response (HCC) -Continue amiodarone 200 mg daily --Paroxysmal atrial fibrillation/atrial flutter, with prior history of A-fib with RVR. -Continue Xarelto 50 mg nightly adjusted for renal function for stroke prophylaxis -Patient also has history of prior VTE  HFrEF (heart failure with reduced ejection fraction) (HCC) - Compensated currently -Cardiology service consult appreciated -Cardiologist recommends discontinuation of Entresto -Discharged on reduced dose Lasix at 20 mg daily  Hypotension -- Stable, anticipate she remains stable off Entresto and with reduced dose of Lasix as well as continuation of hydrocortisone  Discharge Condition: Stable  Follow UP   Follow-up Information     Rollene Rotunda, MD Follow up on 09/29/2021.   Specialty: Cardiology Why: Cardiology Hospital Follow-up on 09/29/2021 at 11:40 AM. Will be at the [facility]. Contact information: [address] [place] [address] [phone]                  Consults obtained -cardiology  Diet and Activity recommendation:  As advised  Discharge Instructions    Discharge  Instructions     Call MD for:  difficulty breathing, headache or visual disturbances   Complete by: As directed    Call MD for:  persistant dizziness or light-headedness   Complete by: As directed    Call MD for:  persistant nausea and vomiting   Complete by: As directed    Call MD for:  temperature >100.4   Complete by: As directed    Diet - low sodium heart healthy   Complete by: As directed    Discharge instructions   Complete by: As directed    1)recommend the following medication changes:- discontinuation of Entresto going forward,  reduced Lasix dose to 20 mg daily which could be adjusted for weight gain or edema.  -Use  Xarelto 15 mg daily (this was treatment for pulmonary embolus and he also has atrial fibrillation)  2)Avoid ibuprofen/Advil/Aleve/Motrin/Goody Powders/Naproxen/BC powders/Meloxicam/Diclofenac/Indomethacin and other Nonsteroidal anti-inflammatory medications as these will make you more likely to bleed and can cause stomach ulcers, can also cause Kidney problems.   3)Repeat CBC and BMP Blood Tests on Monday 09/13/21  4)Very low-salt diet advised  5)Weigh yourself daily, call if you gain more than 3 pounds in 1 day or more than 5 pounds in 1 week as your diuretic medications may need to be adjusted   Increase activity slowly   Complete by: As directed        Discharge Medications     Allergies as of 09/08/2021   No Known Allergies      Medication List     STOP taking these medications    sacubitril-valsartan 24-26 MG Commonly known as: ENTRESTO       TAKE these medications    amiodarone 200 MG tablet Commonly known as: PACERONE Take 1 tablet (200 mg total) by mouth daily. What changed: when to take this   folic acid 1 MG tablet Commonly known as: FOLVITE Take 1 tablet (1 mg total) by mouth daily.   furosemide 20 MG tablet Commonly known as: Lasix Take 1 tablet (20 mg total) by mouth daily. What changed:  medication strength how much to  take when to take this   hydrocortisone 10 MG tablet Commonly known as: CORTEF Take 1 tablet (10 mg total) by mouth 2 (two) times daily.   mirtazapine 15 MG tablet Commonly known as: REMERON Take 15 mg by mouth at bedtime.   multivitamin with minerals Tabs tablet Take 1 tablet by mouth daily.   pantoprazole 40 MG tablet Commonly known as: PROTONIX Take 1 tablet (40 mg total) by mouth daily.   potassium chloride 10 MEQ tablet Commonly known as: KLOR-CON M Take 1 tablet (10 mEq total) by mouth 2 (two) times daily.   Rivaroxaban 15 MG Tabs tablet Commonly known as: XARELTO Take 1 tablet (15 mg total) by mouth daily with supper. What changed:  medication strength how much to take   thiamine 100 MG tablet Take 1 tablet (100 mg total) by mouth daily.       Major procedures and Radiology Reports - PLEASE review detailed and final reports for all details, in brief -  DG Chest 2 View  Result Date: 08/31/2021 CLINICAL DATA:  cp abd EXAM: CHEST - 2 VIEW COMPARISON:  Chest x-ray 03/01/2021, CT chest 03/12/2021 FINDINGS: The heart and mediastinal contours are within normal limits. No focal consolidation. No pulmonary edema. No pleural effusion. No pneumothorax. No acute osseous abnormality. Chronic multiple upper to midthoracic spine compression fractures. IMPRESSION: No active cardiopulmonary disease. Electronically Signed   By: Tish Frederickson M.D.   On: 08/31/2021 19:17   US RENAL  Result Date: 09/07/2021 CLINICAL DATA:  AK I EXAM: RENAL / URINARY TRACT ULTRASOUND COMPLETE COMPARISON:  CT August 31, 2021 FINDINGS: Right Kidney: Renal measurements: 11.3 x 5.1 x 4.4 cm = volume: 133 mL. Echogenicity within normal limits. No mass or hydronephrosis visualized. Left Kidney: Renal measurements: 10.9 x 4.6 x 4.6 cm = volume: 120 mL. Echogenicity within normal limits. No mass or hydronephrosis visualized. Bladder: Appears normal for degree of bladder distention. Other: None. IMPRESSION: No  hydronephrosis. Electronically Signed   By: Maudry Mayhew M.D.   On: 09/07/2021 14:39   CT CHEST ABDOMEN PELVIS WO CONTRAST  Result Date: 08/31/2021 CLINICAL DATA:  Chest abdominal pain, loss of appetite for 3 days EXAM: CT CHEST, ABDOMEN AND PELVIS WITHOUT CONTRAST TECHNIQUE: Multidetector CT imaging of the chest, abdomen and pelvis was performed following the standard protocol without IV contrast. RADIATION DOSE REDUCTION: This exam was performed according to the departmental dose-optimization program which includes automated exposure control, adjustment of the mA and/or kV according to patient  size and/or use of iterative reconstruction technique. COMPARISON:  08/31/2021, 03/12/2021 FINDINGS: CT CHEST FINDINGS Cardiovascular: Limited unenhanced imaging of the heart and great vessels demonstrates no pericardial effusion. Stable left atrial dilation. Normal caliber of the thoracic aorta. Evaluation of the vascular lumen is limited without IV contrast. Mild atherosclerosis of the aortic arch and coronary vasculature. Mediastinum/Nodes: No enlarged mediastinal, hilar, or axillary lymph nodes. Thyroid gland, trachea, and esophagus demonstrate no significant findings. Lungs/Pleura: Chronic biapical scarring, greatest in the right upper lobe. Mild upper lobe predominant emphysema. Dependent hypoventilatory changes are seen within the lower lobes. No effusion or pneumothorax. Central airways are patent. Musculoskeletal: No acute or destructive bony lesions. Chronic T8 wedge compression deformity. Reconstructed images demonstrate no additional findings. CT ABDOMEN PELVIS FINDINGS Hepatobiliary: High attenuation material layering dependently within the gallbladder consistent with sludge. No calcified gallstones or gallbladder wall thickening. Unremarkable unenhanced appearance of the liver. Pancreas: Unremarkable unenhanced appearance. Spleen: Unremarkable unenhanced appearance. Adrenals/Urinary Tract: No urinary  tract calculi or obstructive uropathy within either kidney. The adrenals are unremarkable. Bladder is decompressed, limiting its evaluation. Stomach/Bowel: No bowel obstruction or ileus. Normal appendix right lower quadrant. No bowel wall thickening or inflammatory change. Vascular/Lymphatic: Mild aortic atherosclerosis. No pathologic adenopathy. Rounded calcified structure within the left mid abdominal mesentery may reflect a calcified lymph node or calcified epiploic appendage. Reproductive: Prostate is unremarkable. Evaluation limited by streak artifact from left hip arthroplasty. Other: No free fluid or free gas.  No abdominal wall hernia. Musculoskeletal: Unremarkable left hip arthroplasty. Prior healed left iliac crest fracture. No acute or destructive bony lesions. Reconstructed images demonstrate chronic appearing invagination of the superior and inferior endplates at L4 and L5. IMPRESSION: 1. Emphysema with scattered areas of bilateral scarring, greatest at the right apex. No acute airspace disease. 2. Gallbladder sludge, without evidence of cholelithiasis or cholecystitis. 3.  Aortic Atherosclerosis (ICD10-I70.0). Electronically Signed   By: Sharlet Salina M.D.   On: 08/31/2021 20:35    Micro Results   Recent Results (from the past 240 hour(s))  Resp Panel by RT-PCR (Flu A&B, Covid) Nasopharyngeal Swab     Status: None   Collection Time: 08/31/21  7:16 PM   Specimen: Nasopharyngeal Swab; Nasopharyngeal(NP) swabs in vial transport medium  Result Value Ref Range Status   SARS Coronavirus 2 by RT PCR NEGATIVE NEGATIVE Final    Comment: (NOTE) SARS-CoV-2 target nucleic acids are NOT DETECTED.  The SARS-CoV-2 RNA is generally detectable in upper respiratory specimens during the acute phase of infection. The lowest concentration of SARS-CoV-2 viral copies this assay can detect is 138 copies/mL. A negative result does not preclude SARS-Cov-2 infection and should not be used as the sole basis for  treatment or other patient management decisions. A negative result may occur with  improper specimen collection/handling, submission of specimen other than nasopharyngeal swab, presence of viral mutation(s) within the areas targeted by this assay, and inadequate number of viral copies(<138 copies/mL). A negative result must be combined with clinical observations, patient history, and epidemiological information. The expected result is Negative.  Fact Sheet for Patients:  BloggerCourse.com  Fact Sheet for Healthcare Providers:  SeriousBroker.it  This test is no t yet approved or cleared by the [place] FDA and  has been authorized for detection and/or diagnosis of SARS-CoV-2 by FDA under an Emergency Use Authorization (EUA). This EUA will remain  in effect (meaning this test can be used) for the duration of the COVID-19 declaration under Section 564(b)(1) of the Act, 21 U.S.C.section 360bbb-3(b)(1),  unless the authorization is terminated  or revoked sooner.       Influenza A by PCR NEGATIVE NEGATIVE Final   Influenza B by PCR NEGATIVE NEGATIVE Final    Comment: (NOTE) The Xpert Xpress SARS-CoV-2/FLU/RSV plus assay is intended as an aid in the diagnosis of influenza from Nasopharyngeal swab specimens and should not be used as a sole basis for treatment. Nasal washings and aspirates are unacceptable for Xpert Xpress SARS-CoV-2/FLU/RSV testing.  Fact Sheet for Patients: BloggerCourse.com  Fact Sheet for Healthcare Providers: SeriousBroker.it  This test is not yet approved or cleared by the [place] FDA and has been authorized for detection and/or diagnosis of SARS-CoV-2 by FDA under an Emergency Use Authorization (EUA). This EUA will remain in effect (meaning this test can be used) for the duration of the COVID-19 declaration under Section 564(b)(1) of the Act, 21  U.S.C. section 360bbb-3(b)(1), unless the authorization is terminated or revoked.  Performed at [facility], [address] [place], [place] [address]    Today   Subjective    Roy Ryan today has no new complaints -No chest pains no dyspnea at rest   Patient has been seen and examined prior to discharge   Objective   Blood pressure (!) 97/50, pulse (!) 54, temperature 98 F (36.7 C), temperature source Oral, resp. rate 18, height  (1.88 m), weight 62.5 kg, SpO2 100 %.   Intake/Output Summary (Last 24 hours) at 09/08/2021 1454 Last data filed at 09/08/2021 6045 Gross per 24 hour  Intake 2085.55 ml  Output 450 ml  Net 1635.55 ml   Exam Gen:- Awake Alert, no acute distress  HEENT:- [place].AT, No sclera icterus Neck-Supple Neck,No JVD,.  Lungs-  CTAB , good air movement bilaterally CV- S1, S2 normal, regular, 3/6 systolic murmur Abd-  +ve B.Sounds, Abd Soft, No tenderness,    Extremity/Skin:- No  edema,   good pulses Psych-affect is appropriate, oriented x3 Neuro-no new focal deficits, no tremors    Data Review   CBC w Diff:  Lab Results  Component Value Date   WBC 3.5 (L) 09/07/2021   HGB 10.6 (L) 09/07/2021   HCT 33.4 (L) 09/07/2021   PLT 194 09/07/2021   LYMPHOPCT 22 09/07/2021   MONOPCT 16 09/07/2021   EOSPCT 9 09/07/2021   BASOPCT 1 09/07/2021    CMP:  Lab Results  Component Value Date   NA 139 09/08/2021   K 4.2 09/08/2021   CL 109 09/08/2021   CO2 25 09/08/2021   BUN 27 (H) 09/08/2021   CREATININE 1.82 (H) 09/08/2021   PROT 6.9 09/07/2021   ALBUMIN 3.2 (L) 09/07/2021   BILITOT 0.6 09/07/2021   ALKPHOS 138 (H) 09/07/2021   AST 76 (H) 09/07/2021   ALT 92 (H) 09/07/2021   Total Discharge time is about 33 minutes  Shon Hale M.D on 09/08/2021 at 2:54 PM  Go to [email or web address] -  for contact info  [facility] - Office  [phone]

## 2021-09-28 ENCOUNTER — Telehealth: Payer: Self-pay

## 2021-09-28 NOTE — Telephone Encounter (Signed)
Error

## 2021-09-28 NOTE — Progress Notes (Signed)
?  ?Cardiology Office Note ? ? ?Date:  09/29/2021  ? ?IDLuiz Ryan, DOB Sep 24, 1950, MRN [id number] ? ?PCP:  Christene Lye, FNP  ?Cardiologist:   Nanetta Batty, MD ? ? ?Chief Complaint  ?Patient presents with  ? Mitral Valve Prolapse  ? ? ?  ?History of Present Illness: ? ?Roy Ryan is a 71 y.o. male who presents for follow-up of atrial fibrillation. He was in the hospital in September 2022.  Hehas a history of pulmonary hypertension, alcohol use, poor living conditions.  He apparently had possible sepsis.  He had adrenal insufficiency.  When he was in the hospital he did have a diagnosis which demonstrated pulmonary embolism.  He was treated with IV heparin and then Xarelto.  He also has a history of atrial fibrillation with rapid ventricular rate.  Echocardiogram was done and a TEE.  The TEE demonstrated the EF to be well-preserved.  However moderately elevated pulmonary pressures.  There was severe holosystolic prolapse of both mitral leaflets.  His course was complicated by previous history of noncompliance.  He was just managed with rate control as he was not thought to be a candidate for long-term anticoagulation and cardioversion.  He was not thought to be a candidate because of his multiple comorbidities.  Palliative care consult was suggested rather than considering further treatment including percutaneous treatment of the mitral valve. ? ?He now is living in a retirement home so he does get his medications and he is here for his second follow-up.  I do see that he was hospitalized since I last saw him.  He had some decreased oral intake and was dehydrated.  He had acute renal insufficiency in the setting of his newly initiated [place].  He initially refused hospitalization leaving AMA but came back to [facility] a few days later.  He was hydrated.  He was managed for protein calorie malnutrition.  He was taken off Entresto. ? ?He gets around for the most part with a wheelchair although he can  ambulate a few yards.  He will get weak and short of breath.  He is not describing chest pressure, neck or arm discomfort.  He is not describing any palpitations, presyncope or syncope.  He has had no new PND or orthopnea. ? ? ?Past Medical History:  ?Diagnosis Date  ? Alcohol abuse   ? Atrial flutter (HCC)   ? Mitral regurgitation   ? Seizures (HCC)   ? ? ?Past Surgical History:  ?Procedure Laterality Date  ? TEE WITHOUT CARDIOVERSION N/A 02/25/2021  ? Procedure: TRANSESOPHAGEAL ECHOCARDIOGRAM (TEE);  Surgeon: Thurmon Fair, MD;  Location: [facility] ENDOSCOPY;  Service: Cardiovascular;  Laterality: N/A;  ? ? ? ?Current Outpatient Medications  ?Medication Sig Dispense Refill  ? amiodarone (PACERONE) 200 MG tablet Take 1 tablet (200 mg total) by mouth daily. 30 tablet 11  ? folic acid (FOLVITE) 1 MG tablet Take 1 tablet (1 mg total) by mouth daily. 30 tablet 4  ? furosemide (LASIX) 20 MG tablet Take 1 tablet (20 mg total) by mouth daily. 30 tablet 3  ? hydrocortisone (CORTEF) 10 MG tablet Take 1 tablet (10 mg total) by mouth 2 (two) times daily. 60 tablet 4  ? mirtazapine (REMERON) 15 MG tablet Take 15 mg by mouth at bedtime.    ? Multiple Vitamin (MULTIVITAMIN WITH MINERALS) TABS tablet Take 1 tablet by mouth daily. 30 tablet 2  ? pantoprazole (PROTONIX) 40 MG tablet Take 1 tablet (40 mg total) by mouth daily. 30 tablet  2  ? potassium chloride (KLOR-CON) 10 MEQ tablet Take 10 mEq by mouth 2 (two) times daily.    ? Rivaroxaban (XARELTO) 15 MG TABS tablet Take 1 tablet (15 mg total) by mouth daily with supper. 30 tablet 3  ? thiamine 100 MG tablet Take 1 tablet (100 mg total) by mouth daily. 30 tablet 2  ? ENTRESTO 24-26 MG Take 1 tablet by mouth 2 (two) times daily. (Patient not taking: Reported on 09/29/2021)    ? ?No current facility-administered medications for this visit.  ? ? ?Allergies:   Patient has no known allergies.  ? ? ?ROS:  Please see the history of present illness.   Otherwise, review of systems are positive  for none.   All other systems are reviewed and negative.  ? ? ?PHYSICAL EXAM: ?VS:  BP 110/60   Pulse 64   Ht 6\' 2"  (1.88 m)   Wt 140 lb (63.5 kg)   BMI 17.97 kg/m?  , BMI Body mass index is 17.97 kg/m?. ?GENERAL: Frail appearing  ?NECK:  No jugular venous distention, waveform within normal limits, carotid upstroke brisk and symmetric, no bruits, no thyromegaly ?LUNGS:  Clear to auscultation bilaterally ?CHEST:  Unremarkable ?HEART:  PMI not displaced or sustained,S1 and S2 within normal limits, no S3, no S4, no clicks, no rubs, 3 out of 6 holosystolic murmur heard at the apex and radiating to the axilla, no diastolic murmurs ?ABD:  Flat, positive bowel sounds normal in frequency in pitch, no bruits, no rebound, no guarding, no midline pulsatile mass, no hepatomegaly, no splenomegaly ?EXT:  2 plus pulses throughout, no edema, no cyanosis no clubbing ? ? ? ?EKG:  EKG is not ordered today. ? ? ?Recent Labs: ?08/31/2021: B Natriuretic Peptide 86.0 ?09/07/2021: ALT 92; Hemoglobin 10.6; Magnesium 1.8; Platelets 194 ?09/08/2021: BUN 27; Creatinine, Ser 1.82; Potassium 4.2; Sodium 139; TSH 1.193  ? ? ?Lipid Panel ?   ?Component Value Date/Time  ? CHOL 107 01/31/2020 0930  ? TRIG 29 01/31/2020 0930  ? HDL 43 01/31/2020 0930  ? CHOLHDL 2.5 01/31/2020 0930  ? VLDL 6 01/31/2020 0930  ? [place] 58 01/31/2020 0930  ? ?  ? ?Wt Readings from Last 3 Encounters:  ?09/29/21 140 lb (63.5 kg)  ?09/08/21 137 lb 12.6 oz (62.5 kg)  ?08/31/21 137 lb (62.1 kg)  ?  ? ? ?Other studies Reviewed: ?Additional studies/ records that were reviewed today include: Hospital records ?Review of the above records demonstrates:  Please see elsewhere in the note.   ? ? ?ASSESSMENT AND PLAN: ? ? ?Toxic metabolic encephalopathy: ?He now seems to be a stable living situation and no longer has this issue.  ?  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  He remains on the meds as listed.  No change in therapy.  No med  titration. ? ?New onset A. fib with RVR: ?He is back in sinus rhythm.  He tolerates anticoagulation.  He is tolerating amiodarone.  He has had routine follow-up labs and I will continue to follow these.  ?up labs. ? ?Subclinical hypothyroidism: ?TSH in March was normal.  ? ?Mitral regurgitation with prolapse, acute pulmonary edema with acute hypoxemic respiratory failure:  ?As previously stated and as mentioned in the consult note when he was seen by [place] earlier this month he did not open repair surgical candidate.  Maybe in the future he might be a MitraClip candidate if he is compliant with follow-up and adherence to medications and has  some improvement in his overall status.  He and I talked about this.  With staff from his residential home as there listening to this.  For now we will continue to manage this medically and we will repeat an echo later this year.  ? ?PE: ?Given his arrhythmia as well as this history I would continue anticoagulation.  He has no contraindications.  ? ?AKI: ?His creatinine started at 3.53 but went down to 1.82 at discharge.  This can be followed.  He will remain off Entresto. ? ? ?Current medicines are reviewed at length with the patient today.  The patient does not have concerns regarding medicines. ? ?The following changes have been made: None ? ?Labs/ tests ordered today include: None ? ?No orders of the defined types were placed in this encounter. ? ? ? ?Disposition:   FU with me in 2 months ? ? ?Signed, ?Minus Breeding, MD  ?09/29/2021 1:00 PM    ?[place] ? ? ? ?

## 2021-09-29 ENCOUNTER — Ambulatory Visit (INDEPENDENT_AMBULATORY_CARE_PROVIDER_SITE_OTHER): Payer: Medicare Other | Admitting: Cardiology

## 2021-09-29 ENCOUNTER — Encounter: Payer: Self-pay | Admitting: Cardiology

## 2021-09-29 VITALS — BP 110/60 | HR 64 | Ht 74.0 in | Wt 140.0 lb

## 2021-09-29 DIAGNOSIS — N179 Acute kidney failure, unspecified: Secondary | ICD-10-CM

## 2021-09-29 DIAGNOSIS — I340 Nonrheumatic mitral (valve) insufficiency: Secondary | ICD-10-CM | POA: Diagnosis not present

## 2021-09-29 NOTE — Patient Instructions (Signed)
Medication Instructions:  ?Your physician recommends that you continue on your current medications as directed. Please refer to the Current Medication list given to you today. ? ?*If you need a refill on your cardiac medications before your next appointment, please call your pharmacy* ? ? ?Lab Work: ?None ordered ? ? ?Testing/Procedures: ?None ordered ? ? ?Follow-Up: ?At [facility], you and your health needs are our priority.  As part of our continuing mission to provide you with exceptional heart care, we have created designated Provider Care Teams.  These Care Teams include your primary Cardiologist (physician) and Advanced Practice Providers (APPs -  Physician Assistants and Nurse Practitioners) who all work together to provide you with the care you need, when you need it. ? ?We recommend signing up for the patient portal called "MyChart".  Sign up information is provided on this After Visit Summary.  MyChart is used to connect with patients for Virtual Visits (Telemedicine).  Patients are able to view lab/test results, encounter notes, upcoming appointments, etc.  Non-urgent messages can be sent to your provider as well.   ?To learn more about what you can do with MyChart, go to [email or web address].   ? ?Your next appointment:   ?12/08/2021 @  11:40 am ? ?The format for your next appointment:   ?In Person ? ?Provider:   ?Rollene Rotunda, MD ? ? ? ?Thank you for choosing [facility]!! ? ? ?[phone] ? ?

## 2021-11-24 ENCOUNTER — Ambulatory Visit: Payer: Medicare Other | Admitting: Cardiovascular Disease

## 2021-11-24 ENCOUNTER — Ambulatory Visit (INDEPENDENT_AMBULATORY_CARE_PROVIDER_SITE_OTHER): Payer: Medicare Other | Admitting: Cardiovascular Disease

## 2021-11-24 ENCOUNTER — Encounter: Payer: Self-pay | Admitting: Cardiovascular Disease

## 2021-11-24 ENCOUNTER — Ambulatory Visit: Payer: Medicare Other | Admitting: Cardiology

## 2021-11-24 VITALS — BP 130/64 | HR 73 | Ht 74.0 in | Wt 151.2 lb

## 2021-11-24 DIAGNOSIS — I4891 Unspecified atrial fibrillation: Secondary | ICD-10-CM

## 2021-11-24 NOTE — Progress Notes (Signed)
Patient was s put on my schedule erroneously.

## 2021-12-07 NOTE — Progress Notes (Unsigned)
Cardiology Office Note   Date:  12/08/2021   ID:  Roy Ryan, DOB 23-Feb-1951, MRN [id number]  PCP:  Christene Lye, FNP  Cardiologist:   Nanetta Batty, MD   Chief Complaint  Patient presents with   Mitral Regurgitation      History of Present Illness:  Roy Ryan is a 71 y.o. male who presents for follow-up of atrial fibrillation. He was in the hospital in September 2022.  He has a history of pulmonary hypertension, alcohol use, poor living conditions.  He was in the hospital with possible sepsis.  He had adrenal insufficiency.  When he was in the hospital he did have a diagnosis which demonstrated pulmonary embolism.  He was treated with IV heparin and then Xarelto.  He also has a history of atrial fibrillation with rapid ventricular rate.  Echocardiogram was done and a TEE.  The TEE demonstrated the EF to be well-preserved.  However moderately elevated pulmonary pressures.  There was severe holosystolic prolapse of both mitral leaflets.  His course was complicated by previous history of noncompliance.  He was just managed with rate control as he was not thought to be a candidate for long-term anticoagulation and cardioversion.  He was not thought to be a candidate because of his multiple comorbidities.  Palliative care consult was suggested rather than considering further treatment including percutaneous treatment of the mitral valve.  He was in the hospital most recently in March sent from his nursing home after decreased oral intake and was dehydrated.  He had acute renal insufficiency in the setting of his newly initiated [place].  He initially refused hospitalization leaving AMA but came back to [facility] a few days later.  He was hydrated.  He was managed for protein calorie malnutrition.  He was taken off Entresto.  It sounds like he has a fairly stable situation.  He goes to the dining hall 3 times a day he says.  He can walk or get around in a wheelchair if he is feeling weak.   He says he likes to eat sweets and cannot really tell me why he got dehydrated despite living in a nursing home.  He gets his medicines prescribed and his transportation is arranged to his appointments.  I think he is in a stable situation.  He denies any cardiovascular symptoms such as chest pressure, neck or arm discomfort.  He had no new shortness of breath, PND or orthopnea.  He denies any palpitations, presyncope or syncope   Past Medical History:  Diagnosis Date   Alcohol abuse    Atrial flutter (HCC)    Mitral regurgitation    Seizures (HCC)     Past Surgical History:  Procedure Laterality Date   TEE WITHOUT CARDIOVERSION N/A 02/25/2021   Procedure: TRANSESOPHAGEAL ECHOCARDIOGRAM (TEE);  Surgeon: Thurmon Fair, MD;  Location: [facility] ENDOSCOPY;  Service: Cardiovascular;  Laterality: N/A;     Current Outpatient Medications  Medication Sig Dispense Refill   amiodarone (PACERONE) 200 MG tablet Take 1 tablet (200 mg total) by mouth daily. 30 tablet 11   folic acid (FOLVITE) 1 MG tablet Take 1 tablet (1 mg total) by mouth daily. 30 tablet 4   furosemide (LASIX) 20 MG tablet Take 1 tablet (20 mg total) by mouth daily. 30 tablet 3   hydrocortisone (CORTEF) 10 MG tablet Take 1 tablet (10 mg total) by mouth 2 (two) times daily. 60 tablet 4   mirtazapine (REMERON) 15 MG tablet Take 15 mg by mouth at  bedtime.     Multiple Vitamin (MULTIVITAMIN WITH MINERALS) TABS tablet Take 1 tablet by mouth daily. 30 tablet 2   pantoprazole (PROTONIX) 40 MG tablet Take 1 tablet (40 mg total) by mouth daily. 30 tablet 2   potassium chloride (KLOR-CON) 10 MEQ tablet Take 10 mEq by mouth 2 (two) times daily.     Rivaroxaban (XARELTO) 15 MG TABS tablet Take 1 tablet (15 mg total) by mouth daily with supper. 30 tablet 3   thiamine 100 MG tablet Take 1 tablet (100 mg total) by mouth daily. 30 tablet 2   ENTRESTO 24-26 MG Take 1 tablet by mouth 2 (two) times daily. (Patient not taking: Reported on 12/08/2021)      hydrALAZINE (APRESOLINE) 25 MG tablet Take 1 tablet (25 mg total) by mouth 2 (two) times daily. 180 tablet 3   isosorbide mononitrate (IMDUR) 60 MG 24 hr tablet Take 1 tablet (60 mg total) by mouth daily. 90 tablet 3   No current facility-administered medications for this visit.    Allergies:   Patient has no known allergies.    ROS:  Please see the history of present illness.   Otherwise, review of systems are positive for none.   All other systems are reviewed and negative.    PHYSICAL EXAM: VS:  BP 126/70   Pulse 60   Ht 6\' 2"  (1.88 m)   Wt 152 lb 9.6 oz (69.2 kg)   SpO2 99%   BMI 19.59 kg/m  , BMI Body mass index is 19.59 kg/m. GENERAL:  Well appearing NECK:  No jugular venous distention, waveform within normal limits, carotid upstroke brisk and symmetric, no bruits, no thyromegaly LUNGS:  Clear to auscultation bilaterally CHEST:  Unremarkable HEART:  PMI not displaced or sustained,S1 and S2 within normal limits, no S3, no S4, no clicks, no rubs, no murmurs ABD:  Flat, positive bowel sounds normal in frequency in pitch, no bruits, no rebound, no guarding, no midline pulsatile mass, no hepatomegaly, no splenomegaly EXT:  2 plus pulses throughout, no edema, no cyanosis no clubbing   EKG:  EKG is not ordered today.   Recent Labs: 08/31/2021: B Natriuretic Peptide 86.0 09/07/2021: ALT 92; Hemoglobin 10.6; Magnesium 1.8; Platelets 194 09/08/2021: BUN 27; Creatinine, Ser 1.82; Potassium 4.2; Sodium 139; TSH 1.193    Lipid Panel    Component Value Date/Time   CHOL 107 01/31/2020 0930   TRIG 29 01/31/2020 0930   HDL 43 01/31/2020 0930   CHOLHDL 2.5 01/31/2020 0930   VLDL 6 01/31/2020 0930   LDLCALC 58 01/31/2020 0930      Wt Readings from Last 3 Encounters:  12/08/21 152 lb 9.6 oz (69.2 kg)  11/24/21 151 lb 3.2 oz (68.6 kg)  09/29/21 140 lb (63.5 kg)      Other studies Reviewed: Additional studies/ records that were reviewed today include: Extensive review of  hospital records including his most recent hospitalization in March Review of the above records demonstrates:  Please see elsewhere in the note.     ASSESSMENT AND PLAN:   Toxic metabolic encephalopathy: He is cleared today and has had no new acute neurologic complaints.  I think his social situation is improved.   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  He remains on the meds as listed.   New onset A. fib with RVR: He appears to be maintaining sinus rhythm.  He is tolerating amiodarone.  He tolerates anticoagulation.  Subclinical hypothyroidism: TSH in March was normal.  Mitral regurgitation with prolapse, acute pulmonary edema with acute hypoxemic respiratory failure:  I am going to start some afterload reduction with hydralazine nitrates since he cannot get Entresto.  I like to follow him up again in about 1 month to 6 weeks.  If he seems to be doing well and still compliant with his continued good social situation I would asked that he be reviewed again by [facility].   PE: He remains on anticoagulation.   CKD IIIB: I will be checking a follow-up basic metabolic profile.   Current medicines are reviewed at length with the patient today.  The patient does not have concerns regarding medicines.  The following changes have been made: As above  Labs/ tests ordered today include:   No orders of the defined types were placed in this encounter.    Disposition:   FU with me in 6 weeks  Signed, Rollene RotundaJames Darrell Leonhardt, MD  12/08/2021 12:58 PM    [facility]

## 2021-12-08 ENCOUNTER — Ambulatory Visit (INDEPENDENT_AMBULATORY_CARE_PROVIDER_SITE_OTHER): Payer: Medicare Other | Admitting: Cardiology

## 2021-12-08 ENCOUNTER — Encounter: Payer: Self-pay | Admitting: Cardiology

## 2021-12-08 VITALS — BP 126/70 | HR 60 | Ht 74.0 in | Wt 152.6 lb

## 2021-12-08 DIAGNOSIS — N1832 Chronic kidney disease, stage 3b: Secondary | ICD-10-CM

## 2021-12-08 DIAGNOSIS — I480 Paroxysmal atrial fibrillation: Secondary | ICD-10-CM

## 2021-12-08 DIAGNOSIS — I340 Nonrheumatic mitral (valve) insufficiency: Secondary | ICD-10-CM | POA: Diagnosis not present

## 2021-12-08 MED ORDER — ISOSORBIDE MONONITRATE ER 60 MG PO TB24: 60.0000 mg | ORAL_TABLET | Freq: Every day | ORAL | 3 refills | Status: DC

## 2021-12-08 MED ORDER — HYDRALAZINE HCL 25 MG PO TABS: 25.0000 mg | ORAL_TABLET | Freq: Two times a day (BID) | ORAL | 3 refills | Status: DC

## 2021-12-08 NOTE — Patient Instructions (Signed)
Medication Instructions:  Please start Hydralazine 25 mg one tablet by mouth twice a day. Isosorbide mononitrate 60 mg one tablet by mouth daily. Continue all other medications as listed.  *If you need a refill on your cardiac medications before your next appointment, please call your pharmacy*  Follow-Up: At [facility], you and your health needs are our priority.  As part of our continuing mission to provide you with exceptional heart care, we have created designated Provider Care Teams.  These Care Teams include your primary Cardiologist (physician) and Advanced Practice Providers (APPs -  Physician Assistants and Nurse Practitioners) who all work together to provide you with the care you need, when you need it.  We recommend signing up for the patient portal called "MyChart".  Sign up information is provided on this After Visit Summary.  MyChart is used to connect with patients for Virtual Visits (Telemedicine).  Patients are able to view lab/test results, encounter notes, upcoming appointments, etc.  Non-urgent messages can be sent to your provider as well.   To learn more about what you can do with MyChart, go to [email or web address].    Your next appointment:   4 - 6 week(s)  The format for your next appointment:   In Person  Provider:   Rollene Rotunda, MD{   Important Information About Sugar

## 2022-02-07 DIAGNOSIS — I480 Paroxysmal atrial fibrillation: Secondary | ICD-10-CM | POA: Insufficient documentation

## 2022-02-07 DIAGNOSIS — I10 Essential (primary) hypertension: Secondary | ICD-10-CM | POA: Insufficient documentation

## 2022-02-07 DIAGNOSIS — N1832 Chronic kidney disease, stage 3b: Secondary | ICD-10-CM | POA: Insufficient documentation

## 2022-02-07 NOTE — Progress Notes (Unsigned)
Cardiology Office Note   Date:  02/09/2022   ID:  Roy Ryan, DOB 04-16-1951, MRN [id number]  PCP:  Christene Lye, FNP  Cardiologist:   Nanetta Batty, MD   Chief Complaint  Patient presents with   Mitral Regurgitation      History of Present Illness:  Roy Ryan is a 71 y.o. male who presents for follow-up of atrial fibrillation. He was in the hospital in September 2022.  He has a history of pulmonary hypertension, alcohol use, poor living conditions.  He was in the hospital with possible sepsis.  He had adrenal insufficiency.  When he was in the hospital he did have a diagnosis which demonstrated pulmonary embolism.  He was treated with IV heparin and then Xarelto.  He also has a history of atrial fibrillation with rapid ventricular rate.  Echocardiogram was done and a TEE.  The TEE demonstrated the EF to be well-preserved.  However moderately elevated pulmonary pressures.  There was severe holosystolic prolapse of both mitral leaflets.  His course was complicated by previous history of noncompliance.  He was just managed with rate control as he was not thought to be a candidate for long-term anticoagulation and cardioversion.  He was not thought to be a candidate because of his multiple comorbidities.  Palliative care consult was suggested rather than considering further treatment including percutaneous treatment of the mitral valve.  He was in the hospital most recently in March sent from his nursing home after decreased oral intake and was dehydrated.  He had acute renal insufficiency in the setting of his newly initiated [place].  He initially refused hospitalization leaving AMA but came back to [facility] a few days later.  He was hydrated.  He was managed for protein calorie malnutrition.  He was taken off Entresto.  At the last visit I started hydralazine.  He did well with this.  He says he not having any new shortness of breath.  He is not having any PND or orthopnea.  He is  eating and drinking better than he was when he had some dehydration prior to the last visit.  He is not having any stumbling or falling.  He has gained about 10 pounds because he is doing better.  He is actually been reunited with his children and he is going to be moving outside of [place] to another facility where he will be getting his medications.  He will be switching his care to there.  He denies any chest discomfort, neck or arm discomfort.  He is not describing palpitations, presyncope or syncope.  Has had no edema.  Past Medical History:  Diagnosis Date   Alcohol abuse    Atrial flutter (HCC)    Mitral regurgitation    Seizures (HCC)     Past Surgical History:  Procedure Laterality Date   TEE WITHOUT CARDIOVERSION N/A 02/25/2021   Procedure: TRANSESOPHAGEAL ECHOCARDIOGRAM (TEE);  Surgeon: Thurmon Fair, MD;  Location: [facility];  Service: Cardiovascular;  Laterality: N/A;     Current Outpatient Medications  Medication Sig Dispense Refill   amiodarone (PACERONE) 200 MG tablet Take 1 tablet (200 mg total) by mouth daily. 30 tablet 11   cetirizine (ZYRTEC) 10 MG tablet Take 10 mg by mouth daily.     donepezil (ARICEPT) 10 MG tablet Take 10 mg by mouth at bedtime.     folic acid (FOLVITE) 1 MG tablet Take 1 tablet (1 mg total) by mouth daily. 30 tablet 4   furosemide (LASIX)  20 MG tablet Take 1 tablet (20 mg total) by mouth daily. 30 tablet 3   hydrocortisone (CORTEF) 10 MG tablet Take 1 tablet (10 mg total) by mouth 2 (two) times daily. 60 tablet 4   isosorbide-hydrALAZINE (BIDIL) 20-37.5 MG tablet Take 2 tablets by mouth 3 (three) times daily. 540 tablet 3   metoprolol succinate (TOPROL-XL) 50 MG 24 hr tablet Take 50 mg by mouth. Take 1 1/2 tablet daily     mirtazapine (REMERON) 7.5 MG tablet Take 7.5 mg by mouth at bedtime.     Multiple Vitamin (MULTIVITAMIN WITH MINERALS) TABS tablet Take 1 tablet by mouth daily. 30 tablet 2   Nutritional Supplements (FEEDING SUPPLEMENT,  GLUCERNA 1.2 CAL,) LIQD Place 237 mLs into feeding tube 3 (three) times daily.     omeprazole (PRILOSEC) 20 MG capsule Take 20 mg by mouth daily.     pantoprazole (PROTONIX) 40 MG tablet Take 1 tablet (40 mg total) by mouth daily. 30 tablet 2   Polyethylene Glycol 3350 4 g PACK Take 3,350 mg by mouth daily.     potassium chloride (KLOR-CON) 10 MEQ tablet Take 10 mEq by mouth 2 (two) times daily.     Rivaroxaban (XARELTO) 15 MG TABS tablet Take 1 tablet (15 mg total) by mouth daily with supper. 30 tablet 3   tamsulosin (FLOMAX) 0.4 MG CAPS capsule Take 0.4 mg by mouth daily after supper.     thiamine 100 MG tablet Take 1 tablet (100 mg total) by mouth daily. 30 tablet 2   No current facility-administered medications for this visit.    Allergies:   Patient has no known allergies.    ROS:  Please see the history of present illness.   Otherwise, review of systems are positive for none.   All other systems are reviewed and negative.    PHYSICAL EXAM: VS:  BP 138/70   Pulse 60   Ht 6\' 2"  (1.88 m)   Wt 163 lb (73.9 kg)   BMI 20.93 kg/m  , BMI Body mass index is 20.93 kg/m. GENERAL:  Well appearing NECK:  No jugular venous distention, waveform within normal limits, carotid upstroke brisk and symmetric, no bruits, no thyromegaly LUNGS:  Clear to auscultation bilaterally CHEST:  Unremarkable HEART:  PMI not displaced or sustained,S1 and S2 within normal limits, no S3, no S4, no clicks, no rubs, 3 out of 6 apical holosystolic murmur radiating to the axilla murmurs ABD:  Flat, positive bowel sounds normal in frequency in pitch, no bruits, no rebound, no guarding, no midline pulsatile mass, no hepatomegaly, no splenomegaly EXT:  2 plus pulses throughout, no edema, no cyanosis no clubbing   EKG:  EKG is  ordered today. Sinus rhythm, rate 60, axis within normal limits, intervals within normal limits, left ventricular hypertrophy by voltage criteria  Recent Labs: 08/31/2021: B Natriuretic  Peptide 86.0 09/07/2021: ALT 92; Hemoglobin 10.6; Magnesium 1.8; Platelets 194 09/08/2021: BUN 27; Creatinine, Ser 1.82; Potassium 4.2; Sodium 139; TSH 1.193    Lipid Panel    Component Value Date/Time   CHOL 107 01/31/2020 0930   TRIG 29 01/31/2020 0930   HDL 43 01/31/2020 0930   CHOLHDL 2.5 01/31/2020 0930   VLDL 6 01/31/2020 0930   LDLCALC 58 01/31/2020 0930      Wt Readings from Last 3 Encounters:  02/09/22 163 lb (73.9 kg)  12/08/21 152 lb 9.6 oz (69.2 kg)  11/24/21 151 lb 3.2 oz (68.6 kg)      Other studies Reviewed:  Additional studies/ records that were reviewed today include: Labs Review of the above records demonstrates:  Please see elsewhere in the note.     ASSESSMENT AND PLAN:   Toxic metabolic encephalopathy: He is cleared completely from this and his previous acute hospitalization.   Hypotension due to adrenal sufficiency: His blood pressure is now normotensive with the meds as listed above.  He is tolerating the titration.  New onset A. fib with RVR: He tolerates amiodarone and anticoagulation.  He has been up-to-date with labs.   Subclinical hypothyroidism: TSH in March was normal.  Mitral regurgitation with prolapse, acute pulmonary edema with acute hypoxemic respiratory failure:    He has not tolerated Entresto with renal insufficiency before.  He might be able to tolerate it in the future if he can stay hydrated but I am going to go ahead and continue with hydralazine nitrate switching him to BiDil 2 pills 3 times daily.  He will continue on the other meds as listed.  Ultimately now that his social situation and other acute issues have improved I think he should be considered a candidate for mitral valve repair possibly percutaneously.  However, he is moving out of the region and I will suggest he establish with cardiology immediately on his transfer when he goes to another facility in [place].  I discussed this with the patient and his  caregiver.  PE: He remains on anticoagulation.  No change in therapy.  CKD IIIB: I will ask the facility to draw a basic metabolic profile.   Current medicines are reviewed at length with the patient today.  The patient does not have concerns regarding medicines.  The following changes have been made: As above  Labs/ tests ordered today include:   Orders Placed This Encounter  Procedures   EKG 12-Lead     Disposition:   FU with me in 1 month if he still in the area.    Signed, Rollene Rotunda, MD  02/09/2022 12:09 PM     [facility]

## 2022-02-09 ENCOUNTER — Encounter: Payer: Self-pay | Admitting: Cardiology

## 2022-02-09 ENCOUNTER — Ambulatory Visit (INDEPENDENT_AMBULATORY_CARE_PROVIDER_SITE_OTHER): Payer: Medicare Other | Admitting: Cardiology

## 2022-02-09 VITALS — BP 138/70 | HR 60 | Ht 74.0 in | Wt 163.0 lb

## 2022-02-09 DIAGNOSIS — I340 Nonrheumatic mitral (valve) insufficiency: Secondary | ICD-10-CM

## 2022-02-09 DIAGNOSIS — N1832 Chronic kidney disease, stage 3b: Secondary | ICD-10-CM | POA: Diagnosis not present

## 2022-02-09 DIAGNOSIS — I480 Paroxysmal atrial fibrillation: Secondary | ICD-10-CM

## 2022-02-09 DIAGNOSIS — I10 Essential (primary) hypertension: Secondary | ICD-10-CM | POA: Diagnosis not present

## 2022-02-09 MED ORDER — ISOSORB DINITRATE-HYDRALAZINE 20-37.5 MG PO TABS: 2.0000 | ORAL_TABLET | Freq: Three times a day (TID) | ORAL | 3 refills | Status: AC

## 2022-02-09 NOTE — Patient Instructions (Signed)
Medication Instructions:  Please discontinue your Isosorbide and Hydralazine. Start Bidil 20-3705 mg take (2) tablets 3 times a day. Continue all other medications as listed.  *If you need a refill on your cardiac medications before your next appointment, please call your pharmacy*  Lab Work: Please have blood work with results faxed to Dr Antoine Poche [phone].  (BMP)  If you have labs (blood work) drawn today and your tests are completely normal, you will receive your results only by: MyChart Message (if you have MyChart) OR A paper copy in the mail If you have any lab test that is abnormal or we need to change your treatment, we will call you to review the results.  Follow-Up: At [facility], you and your health needs are our priority.  As part of our continuing mission to provide you with exceptional heart care, we have created designated Provider Care Teams.  These Care Teams include your primary Cardiologist (physician) and Advanced Practice Providers (APPs -  Physician Assistants and Nurse Practitioners) who all work together to provide you with the care you need, when you need it.  We recommend signing up for the patient portal called "MyChart".  Sign up information is provided on this After Visit Summary.  MyChart is used to connect with patients for Virtual Visits (Telemedicine).  Patients are able to view lab/test results, encounter notes, upcoming appointments, etc.  Non-urgent messages can be sent to your provider as well.   To learn more about what you can do with MyChart, go to [email or web address].    Your next appointment:   1 month(s)  The format for your next appointment:   In Person  Provider:   Rollene Rotunda, MD{    Important Information About Sugar

## 2022-02-11 LAB — BASIC METABOLIC PANEL
BUN/Creatinine Ratio: 9 — ABNORMAL LOW (ref 10–24)
BUN: 12 mg/dL (ref 8–27)
CO2: 24 mmol/L (ref 20–29)
Calcium: 9.8 mg/dL (ref 8.6–10.2)
Chloride: 100 mmol/L (ref 96–106)
Creatinine, Ser: 1.3 mg/dL — ABNORMAL HIGH (ref 0.76–1.27)
Glucose: 100 mg/dL — ABNORMAL HIGH (ref 70–99)
Potassium: 4.3 mmol/L (ref 3.5–5.2)
Sodium: 141 mmol/L (ref 134–144)
eGFR: 60 mL/min/{1.73_m2} (ref >59)

## 2022-03-14 NOTE — Progress Notes (Deleted)
Cardiology Office Note   Date:  03/14/2022   ID:  Roy Ryan, DOB 1951-02-04, MRN [id number]  PCP:  Lubertha Sayres, FNP  Cardiologist:   Quay Burow, MD   No chief complaint on file.     History of Present Illness:  Roy Ryan is a 71 y.o. male who presents for follow-up of atrial fibrillation. He was in the hospital in September 2022.  He has a history of pulmonary hypertension, alcohol use, poor living conditions.  He was in the hospital with possible sepsis.  He had adrenal insufficiency.  When he was in the hospital he did have a diagnosis which demonstrated pulmonary embolism.  He was treated with IV heparin and then Xarelto.  He also has a history of atrial fibrillation with rapid ventricular rate.  Echocardiogram was done and a TEE.  The TEE demonstrated the EF to be well-preserved.  However moderately elevated pulmonary pressures.  There was severe holosystolic prolapse of both mitral leaflets.  His course was complicated by previous history of noncompliance.  He was just managed with rate control as he was not thought to be a candidate for long-term anticoagulation and cardioversion.  He was not thought to be a candidate because of his multiple comorbidities.  Palliative care consult was suggested rather than considering further treatment including percutaneous treatment of the mitral valve.  He was in the hospital most recently in March sent from his nursing home after decreased oral intake and was dehydrated.  He had acute renal insufficiency in the setting of his newly initiated [employer].  He initially refused hospitalization leaving AMA but came back to [facility] a few days later.  He was hydrated.  He was managed for protein calorie malnutrition.  He was taken off Entresto.  At the last visit I switched him to BiDil.  ***  ***At the last visit I started hydralazine.  He did well with this.  He says he not having any new shortness of breath.  He is not having any PND or  orthopnea.  He is eating and drinking better than he was when he had some dehydration prior to the last visit.  He is not having any stumbling or falling.  He has gained about 10 pounds because he is doing better.  He is actually been reunited with his children and he is going to be moving outside of [place] to another facility where he will be getting his medications.  He will be switching his care to there.  He denies any chest discomfort, neck or arm discomfort.  He is not describing palpitations, presyncope or syncope.  Has had no edema.  Past Medical History:  Diagnosis Date   Alcohol abuse    Atrial flutter ([place])    Mitral regurgitation    Seizures ([place])     Past Surgical History:  Procedure Laterality Date   TEE WITHOUT CARDIOVERSION N/A 02/25/2021   Procedure: TRANSESOPHAGEAL ECHOCARDIOGRAM (TEE);  Surgeon: Sanda Klein, MD;  Location: [facility] ENDOS[facility];  Service: Cardiovascular;  Laterality: N/A;     Current Outpatient Medications  Medication Sig Dispense Refill   amiodarone (PACERONE) 200 MG tablet Take 1 tablet (200 mg total) by mouth daily. 30 tablet 11   cetirizine (ZYRTEC) 10 MG tablet Take 10 mg by mouth daily.     donepezil (ARICEPT) 10 MG tablet Take 10 mg by mouth at bedtime.     folic acid (FOLVITE) 1 MG tablet Take 1 tablet (1 mg total) by mouth daily. [place]  tablet 4   furosemide (LASIX) 20 MG tablet Take 1 tablet (20 mg total) by mouth daily. 30 tablet 3   hydrocortisone (CORTEF) 10 MG tablet Take 1 tablet (10 mg total) by mouth 2 (two) times daily. 60 tablet 4   isosorbide-hydrALAZINE (BIDIL) 20-37.5 MG tablet Take 2 tablets by mouth 3 (three) times daily. 540 tablet 3   metoprolol succinate (TOPROL-XL) 50 MG 24 hr tablet Take 50 mg by mouth. Take 1 1/2 tablet daily     mirtazapine (REMERON) 7.5 MG tablet Take 7.5 mg by mouth at bedtime.     Multiple Vitamin (MULTIVITAMIN WITH MINERALS) TABS tablet Take 1 tablet by mouth daily. 30 tablet 2   Nutritional Supplements  (FEEDING SUPPLEMENT, GLUCERNA 1.2 CAL,) LIQD Place 237 mLs into feeding tube 3 (three) times daily.     omeprazole (PRILOSEC) 20 MG capsule Take 20 mg by mouth daily.     pantoprazole (PROTONIX) 40 MG tablet Take 1 tablet (40 mg total) by mouth daily. 30 tablet 2   Polyethylene Glycol 3350 4 g PACK Take 3,350 mg by mouth daily.     potassium chloride (KLOR-CON) 10 MEQ tablet Take 10 mEq by mouth 2 (two) times daily.     Rivaroxaban (XARELTO) 15 MG TABS tablet Take 1 tablet (15 mg total) by mouth daily with supper. 30 tablet 3   tamsulosin (FLOMAX) 0.4 MG CAPS capsule Take 0.4 mg by mouth daily after supper.     thiamine 100 MG tablet Take 1 tablet (100 mg total) by mouth daily. 30 tablet 2   No current facility-administered medications for this visit.    Allergies:   Patient has no known allergies.    ROS:  Please see the history of present illness.   Otherwise, review of systems are positive for ***.   All other systems are reviewed and negative.    PHYSICAL EXAM: VS:  There were no vitals taken for this visit. , BMI There is no height or weight on file to calculate BMI. GENERAL:  Well appearing NECK:  No jugular venous distention, waveform within normal limits, carotid upstroke brisk and symmetric, no bruits, no thyromegaly LUNGS:  Clear to auscultation bilaterally CHEST:  Unremarkable HEART:  PMI not displaced or sustained,S1 and S2 within normal limits, no S3, no S4, no clicks, no rubs, *** murmurs ABD:  Flat, positive bowel sounds normal in frequency in pitch, no bruits, no rebound, no guarding, no midline pulsatile mass, no hepatomegaly, no splenomegaly EXT:  2 plus pulses throughout, no edema, no cyanosis no clubbing     ***GENERAL:  Well appearing NECK:  No jugular venous distention, waveform within normal limits, carotid upstroke brisk and symmetric, no bruits, no thyromegaly LUNGS:  Clear to auscultation bilaterally CHEST:  Unremarkable HEART:  PMI not displaced or  sustained,S1 and S2 within normal limits, no S3, no S4, no clicks, no rubs, 3 out of 6 apical holosystolic murmur radiating to the axilla murmurs ABD:  Flat, positive bowel sounds normal in frequency in pitch, no bruits, no rebound, no guarding, no midline pulsatile mass, no hepatomegaly, no splenomegaly EXT:  2 plus pulses throughout, no edema, no cyanosis no clubbing   EKG:  EKG is *** ordered today. Sinus rhythm, rate ***, axis within normal limits, intervals within normal limits, left ventricular hypertrophy by voltage criteria  Recent Labs: 08/31/2021: B Natriuretic Peptide 86.0 09/07/2021: ALT 92; Hemoglobin 10.6; Magnesium 1.8; Platelets 194 09/08/2021: TSH 1.193 02/10/2022: BUN 12; Creatinine, Ser 1.30; Potassium 4.3; Sodium  141    Lipid Panel    Component Value Date/Time   CHOL 107 01/31/2020 0930   TRIG 29 01/31/2020 0930   HDL 43 01/31/2020 0930   CHOLHDL 2.5 01/31/2020 0930   VLDL 6 01/31/2020 0930   LDLCALC 58 01/31/2020 0930      Wt Readings from Last 3 Encounters:  02/09/22 163 lb (73.9 kg)  12/08/21 152 lb 9.6 oz (69.2 kg)  11/24/21 151 lb 3.2 oz (68.6 kg)      Other studies Reviewed: Additional studies/ records that were reviewed today include: *** Review of the above records demonstrates:  Please see elsewhere in the note.     ASSESSMENT AND PLAN:   Toxic metabolic encephalopathy: *** He is cleared completely from this and his previous acute hospitalization.   Hypotension due to adrenal sufficiency: ***  His blood pressure is now normotensive with the meds as listed above.  He is tolerating the titration.  New onset A. fib with RVR: ***   He tolerates amiodarone and anticoagulation.  He has been up-to-date with labs.   Subclinical hypothyroidism: *** TSH in March was normal.  Mitral regurgitation with prolapse, acute pulmonary edema with acute hypoxemic respiratory failure:    ***  He has not tolerated Entresto with renal insufficiency before.  He  might be able to tolerate it in the future if he can stay hydrated but I am going to go ahead and continue with hydralazine nitrate switching him to BiDil 2 pills 3 times daily.  He will continue on the other meds as listed.  Ultimately now that his social situation and other acute issues have improved I think he should be considered a candidate for mitral valve repair possibly percutaneously.  However, he is moving out of the region and I will suggest he establish with cardiology immediately on his transfer when he goes to another facility in [place].  I discussed this with the patient and his caregiver.  PE: ***  He remains on anticoagulation.  No change in therapy.  CKD IIIB: ***  I will ask the facility to draw a basic metabolic profile.   Current medicines are reviewed at length with the patient today.  The patient does not have concerns regarding medicines.  The following changes have been made:  ***  Labs/ tests ordered today include:  ***   No orders of the defined types were placed in this encounter.    Disposition:   FU with me in ***     Signed, Rollene Rotunda, MD  03/14/2022 7:59 PM     [facility]

## 2022-03-16 ENCOUNTER — Ambulatory Visit: Payer: Medicare Other | Admitting: Cardiology

## 2022-03-16 DIAGNOSIS — I340 Nonrheumatic mitral (valve) insufficiency: Secondary | ICD-10-CM

## 2022-03-16 DIAGNOSIS — N1832 Chronic kidney disease, stage 3b: Secondary | ICD-10-CM

## 2022-03-16 DIAGNOSIS — I480 Paroxysmal atrial fibrillation: Secondary | ICD-10-CM

## 2022-08-04 DEATH — deceased

## 2023-05-08 IMAGING — US US RENAL
1 series · 14 of 25 positions shown · non-contrast
Comparison: CT August 31, 2021

CLINICAL DATA: AK I

EXAM:
RENAL / URINARY TRACT ULTRASOUND COMPLETE

[Series 1: us renal · 14 of 75 slices shown]
[im 1/75]
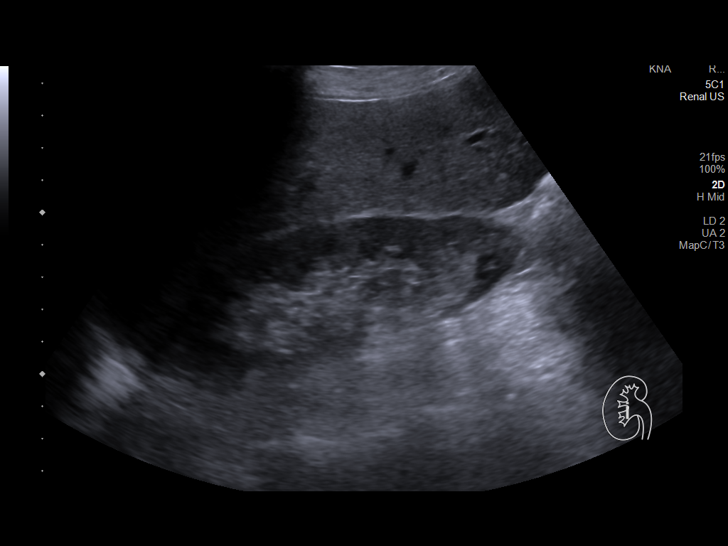
[im 7/75]
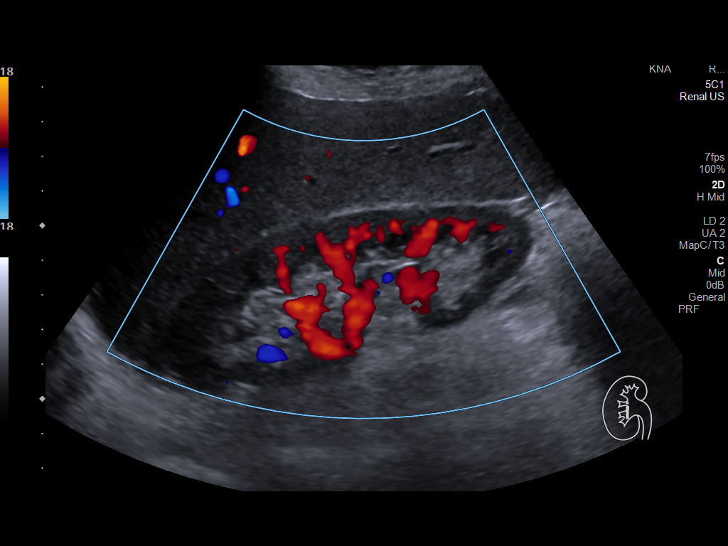
[im 13/75]
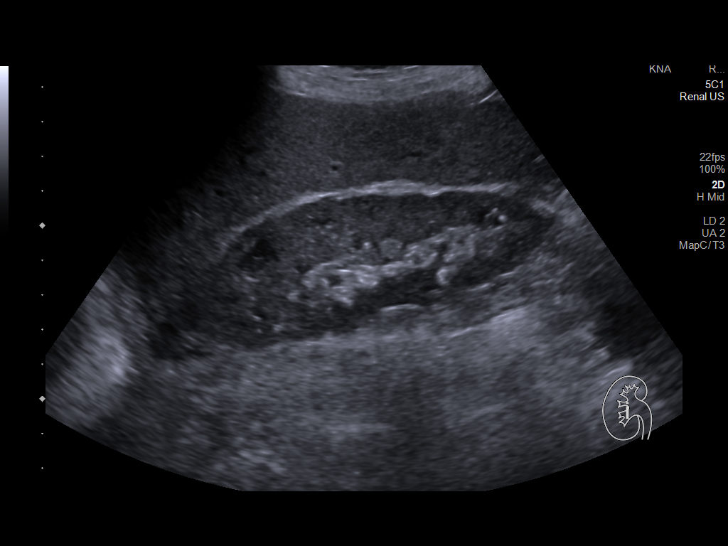
[im 19/75]
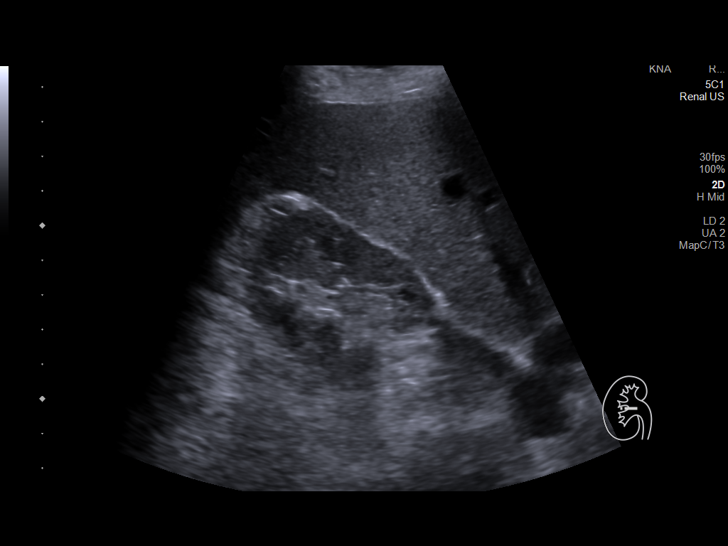
[im 25/75]
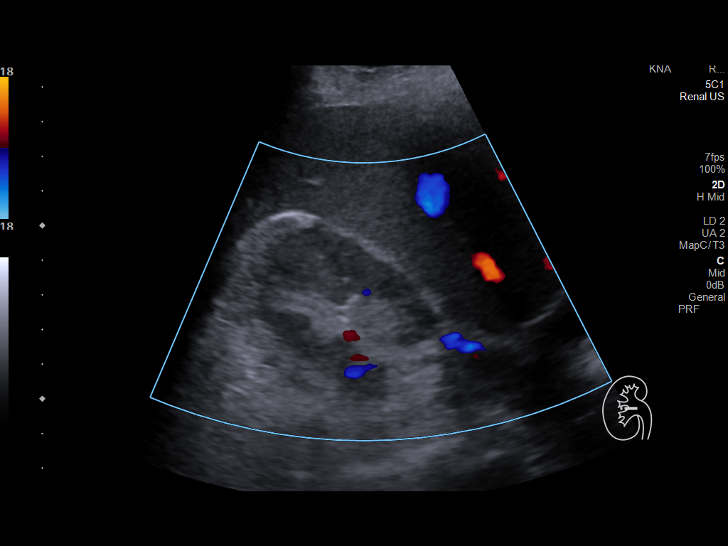
[im 28/75]
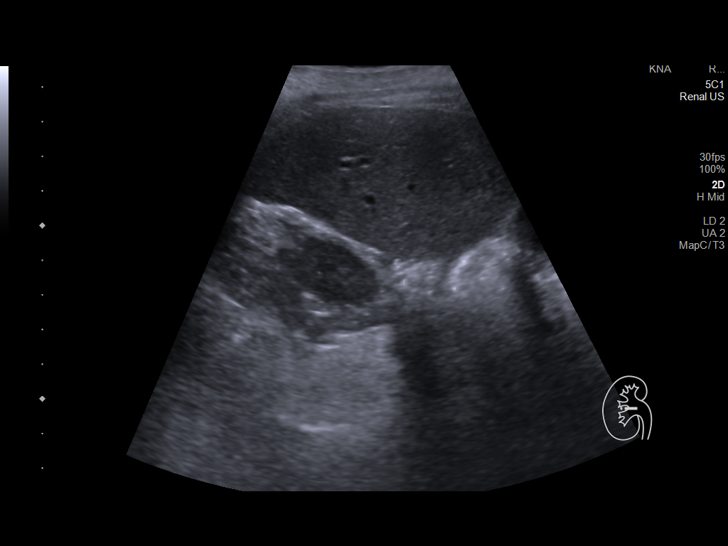
[im 34/75]
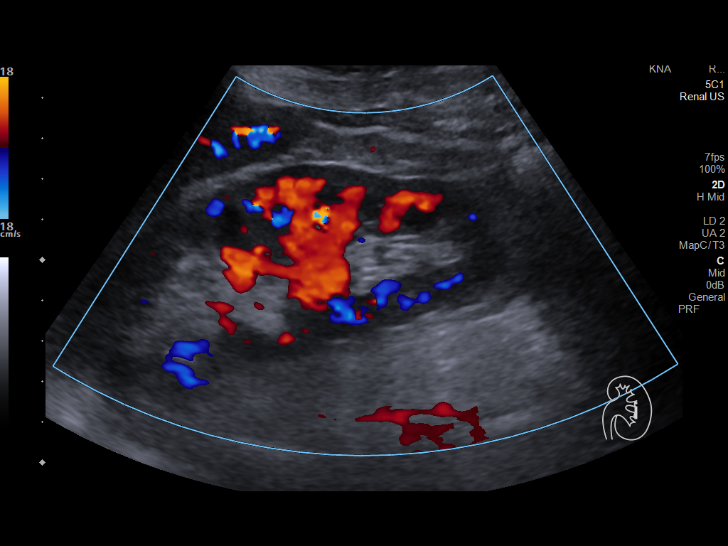
[im 41/75]
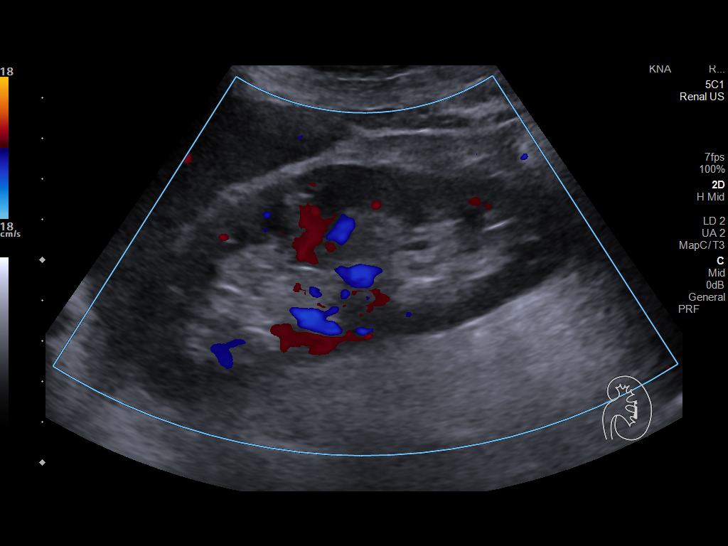
[im 47/75]
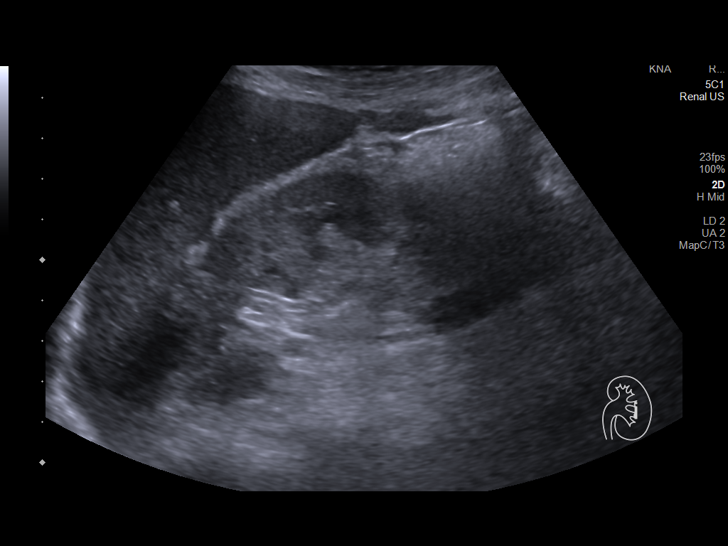
[im 50/75]
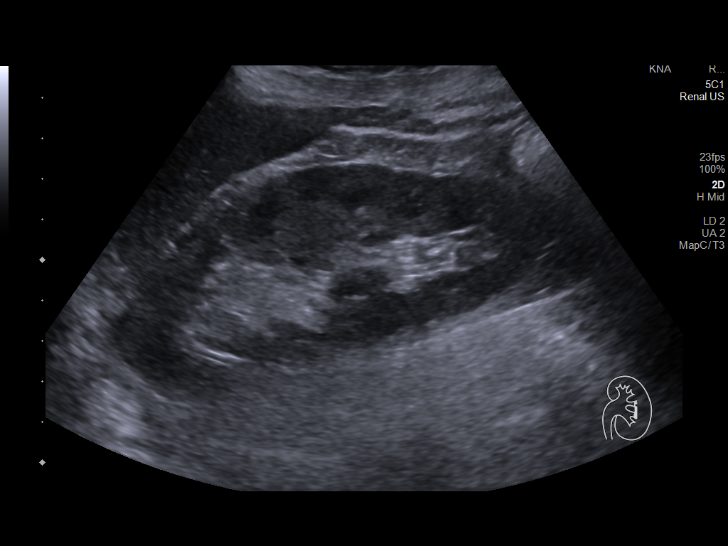
[im 56/75]
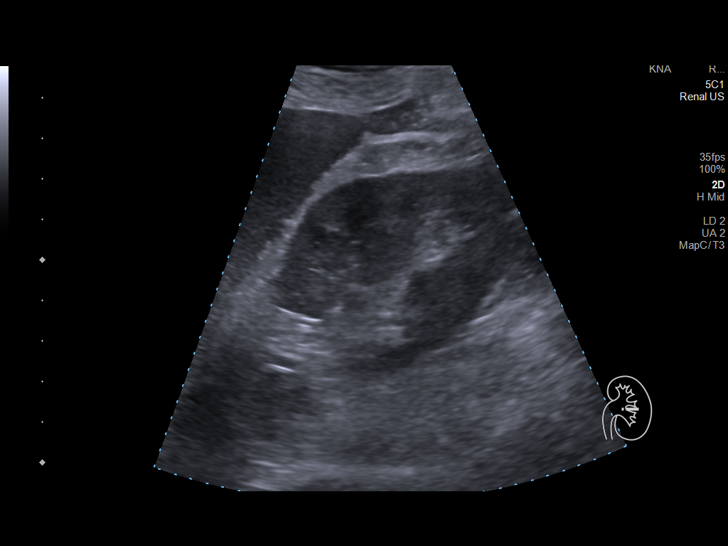
[im 62/75]
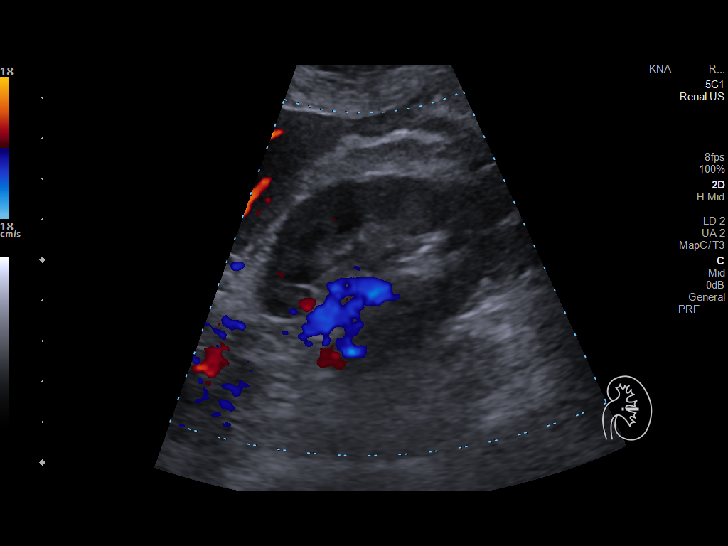
[im 68/75]
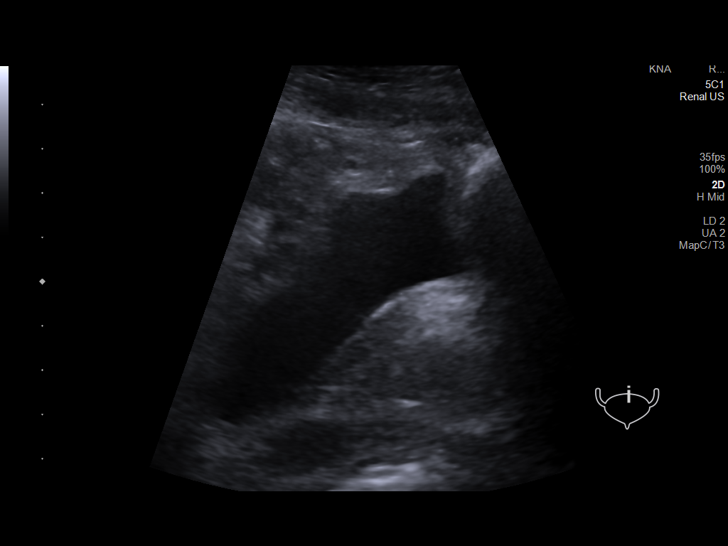
[im 75/75]
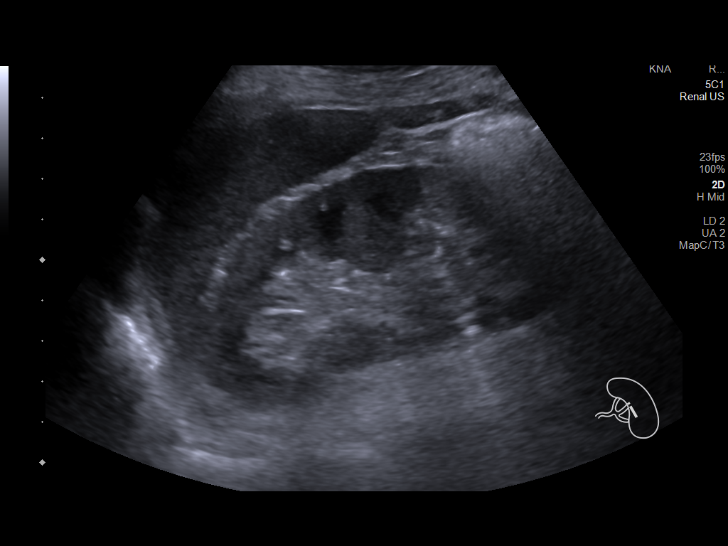

[14 of 25 positions shown; findings below may reference images not displayed]

FINDINGS: Right Kidney:

Renal measurements: 11.3 x 5.1 x 4.4 cm = volume: 133 mL.
Echogenicity within normal limits. No mass or hydronephrosis
visualized.

Left Kidney:

Renal measurements: 10.9 x 4.6 x 4.6 cm = volume: 120 mL.
Echogenicity within normal limits. No mass or hydronephrosis
visualized.

Bladder:

Appears normal for degree of bladder distention.

Other:

None.
IMPRESSION: No hydronephrosis.
